# Patient Record
Sex: Male | Born: 1950 | Race: White | Hispanic: No | Marital: Married
Health system: Northeastern US, Academic
[De-identification: ages and names within clinical notes are randomized; demographics above are authoritative.]

## PROBLEM LIST (undated history)

## (undated) DIAGNOSIS — D649 Anemia, unspecified: Secondary | ICD-10-CM

## (undated) DIAGNOSIS — G4730 Sleep apnea, unspecified: Secondary | ICD-10-CM

## (undated) DIAGNOSIS — E119 Type 2 diabetes mellitus without complications: Secondary | ICD-10-CM

## (undated) DIAGNOSIS — K219 Gastro-esophageal reflux disease without esophagitis: Secondary | ICD-10-CM

## (undated) DIAGNOSIS — Z860100 Personal history of colon polyps, unspecified: Secondary | ICD-10-CM

## (undated) DIAGNOSIS — K209 Esophagitis, unspecified without bleeding: Secondary | ICD-10-CM

## (undated) DIAGNOSIS — I10 Essential (primary) hypertension: Secondary | ICD-10-CM

## (undated) DIAGNOSIS — Z9889 Other specified postprocedural states: Secondary | ICD-10-CM

## (undated) DIAGNOSIS — K7460 Unspecified cirrhosis of liver: Secondary | ICD-10-CM

## (undated) DIAGNOSIS — C4490 Unspecified malignant neoplasm of skin, unspecified: Secondary | ICD-10-CM

## (undated) HISTORY — PX: SKIN CANCER EXCISION: SHX779

## (undated) HISTORY — PX: COLONOSCOPY: SHX174

## (undated) HISTORY — PX: UVULOPALATOPHARYNGOPLASTY: SHX827

## (undated) HISTORY — PX: LIVER BIOPSY: SHX301

## (undated) HISTORY — PX: EYE SURGERY: SHX253

## (undated) HISTORY — PX: TONSILLECTOMY: SUR1361

---

## 2003-05-11 HISTORY — PX: UVULOPALATOPHARYNGOPLASTY: SHX827

## 2005-10-28 ENCOUNTER — Ambulatory Visit: Payer: Self-pay | Admitting: Internal Medicine

## 2005-11-05 ENCOUNTER — Ambulatory Visit: Payer: Self-pay | Admitting: Unknown Physician Specialty

## 2005-11-07 ENCOUNTER — Ambulatory Visit: Payer: Self-pay | Admitting: Internal Medicine

## 2005-12-08 ENCOUNTER — Ambulatory Visit: Payer: Self-pay | Admitting: Internal Medicine

## 2006-02-11 ENCOUNTER — Ambulatory Visit: Payer: Self-pay | Admitting: Internal Medicine

## 2006-03-10 ENCOUNTER — Ambulatory Visit: Payer: Self-pay | Admitting: Internal Medicine

## 2009-06-20 ENCOUNTER — Ambulatory Visit: Payer: Self-pay | Admitting: Internal Medicine

## 2009-07-08 ENCOUNTER — Ambulatory Visit: Payer: Self-pay | Admitting: Internal Medicine

## 2011-09-01 ENCOUNTER — Ambulatory Visit: Payer: Self-pay | Admitting: Podiatry

## 2011-10-19 ENCOUNTER — Ambulatory Visit: Payer: Self-pay | Admitting: Internal Medicine

## 2012-04-25 ENCOUNTER — Ambulatory Visit: Payer: Self-pay | Admitting: Internal Medicine

## 2015-07-10 ENCOUNTER — Other Ambulatory Visit: Payer: Self-pay | Admitting: Internal Medicine

## 2015-08-28 ENCOUNTER — Ambulatory Visit: Payer: Self-pay

## 2016-03-15 ENCOUNTER — Encounter: Payer: Self-pay | Admitting: *Deleted

## 2016-03-15 ENCOUNTER — Emergency Department (INDEPENDENT_AMBULATORY_CARE_PROVIDER_SITE_OTHER)
Admission: EM | Admit: 2016-03-15 | Discharge: 2016-03-15 | Disposition: A | Discharge status: Home / Self Care | Payer: 59 | Source: Home / Self Care | Attending: Family Medicine | Admitting: Family Medicine

## 2016-03-15 DIAGNOSIS — B9789 Other viral agents as the cause of diseases classified elsewhere: Secondary | ICD-10-CM

## 2016-03-15 DIAGNOSIS — J069 Acute upper respiratory infection, unspecified: Secondary | ICD-10-CM

## 2016-03-15 HISTORY — DX: Unspecified malignant neoplasm of skin, unspecified: C44.90

## 2016-03-15 HISTORY — DX: Wilson's disease: E83.01

## 2016-03-15 HISTORY — DX: Type 2 diabetes mellitus without complications: E11.9

## 2016-03-15 LAB — POCT INFLUENZA A/B
INFLUENZA B, POC: NEGATIVE
Influenza A, POC: NEGATIVE

## 2016-03-15 MED ORDER — AMOXICILLIN 875 MG PO TABS: 875.0000 mg | ORAL_TABLET | Freq: Two times a day (BID) | ORAL | 0 refills | Status: DC

## 2016-03-15 MED ORDER — BENZONATATE 200 MG PO CAPS: 200.0000 mg | ORAL_CAPSULE | Freq: Every day | ORAL | 0 refills | Status: DC

## 2016-03-15 NOTE — ED Provider Notes (Signed)
Jack Powell C[facility]    CSN: [id number] Arrival date & time: 03/15/16  0902     History   Chief Complaint Chief Complaint  Patient presents with  . Cough    HPI Jack Powell is a 65 y.o. male.   Complains of 3 day history flu-like illness including myalgias, headache, fever/chills, fatigue, and cough.  Also has mild nasal congestion and sore throat.  Cough is non-productive and somewhat worse at night.  No pleuritic pain or shortness of breath.  He has not had a flu shot this season.     The history is provided by the patient and the spouse.    Past Medical History:  Diagnosis Date  . Diabetes mellitus without complication ([place])   . Skin cancer   . Wilson disease     There are no active problems to display for this patient.   Past Surgical History:  Procedure Laterality Date  . LIVER BIOPSY    . SKIN CANCER EXCISION    . UVULOPALATOPHARYNGOPLASTY         Home Medications    Prior to Admission medications   Medication Sig Start Date End Date Taking? Authorizing Provider  ALPRAZolam Duanne Moron) 0.25 MG tablet Take 0.25 mg by mouth at bedtime as needed for anxiety.   Yes Historical Provider, MD  cholecalciferol (VITAMIN D) 1000 units tablet Take 1,000 Units by mouth daily.   Yes Historical Provider, MD  glimepiride (AMARYL) 2 MG tablet Take 2 mg by mouth daily with breakfast.   Yes Historical Provider, MD  hydrochlorothiazide (HYDRODIURIL) 25 MG tablet Take 25 mg by mouth daily.   Yes Historical Provider, MD  linagliptin (TRADJENTA) 5 MG TABS tablet Take 5 mg by mouth daily.   Yes Historical Provider, MD  MULTIPLE VITAMIN PO Take by mouth.   Yes Historical Provider, MD  omeprazole (PRILOSEC) 20 MG capsule Take 20 mg by mouth daily.   Yes Historical Provider, MD  Trientine HCl (SYPRINE) 250 MG CAPS Take by mouth.   Yes Historical Provider, MD  amoxicillin (AMOXIL) 875 MG tablet Take 1 tablet (875 mg total) by mouth 2 (two) times daily. (Rx void after  03/23/16) 03/15/16   Kandra Nicolas, MD  benzonatate (TESSALON) 200 MG capsule Take 1 capsule (200 mg total) by mouth at bedtime. Take as needed for cough 03/15/16   Kandra Nicolas, MD    Family History Family History  Problem Relation Age of Onset  . Diabetes Mother   . Heart disease Mother   . Cancer Father     colon    Social History Social History  Substance Use Topics  . Smoking status: Never Smoker  . Smokeless tobacco: Never Used  . Alcohol use Yes     Allergies   Penicillins   Review of Systems Review of Systems + sore throat + cough No pleuritic pain No wheezing + nasal congestion + post-nasal drainage No sinus pain/pressure No itchy/red eyes No earache No hemoptysis No SOB ? fever, + chills/sweats No nausea No vomiting No abdominal pain No diarrhea No urinary symptoms No skin rash + fatigue + myalgias + headache Used OTC meds without relief   Physical Exam Triage Vital Signs ED Triage Vitals  Enc Vitals Group     BP 03/15/16 0930 144/73     Pulse Rate 03/15/16 0930 77     Resp 03/15/16 0930 16     Temp 03/15/16 0930 98.3 F (36.8 C)     Temp Source 03/15/16  0930 Oral     SpO2 03/15/16 0930 96 %     Weight 03/15/16 0930 163 lb (73.9 kg)     Height 03/15/16 0930 6' (1.829 m)     Head Circumference --      Peak Flow --      Pain Score 03/15/16 0935 0     Pain Loc --      Pain Edu? --      Excl. in [place]? --    No data found.   Updated Vital Signs BP 144/73 (BP Location: Left Arm)   Pulse 77   Temp 98.3 F (36.8 C) (Oral)   Resp 16   Ht 6' (1.829 m)   Wt 163 lb (73.9 kg)   SpO2 96%   BMI 22.11 kg/m   Visual Acuity Right Eye Distance:   Left Eye Distance:   Bilateral Distance:    Right Eye Near:   Left Eye Near:    Bilateral Near:     Physical Exam Nursing notes and Vital Signs reviewed. Appearance:  Patient appears stated age, and in no acute distress Eyes:  Pupils are equal, round, and reactive to light and  accomodation.  Extraocular movement is intact.  Conjunctivae are not inflamed  Ears:  Canals normal.  Tympanic membranes normal.  Nose:  Mildly congested turbinates.  No sinus tenderness.  Pharynx:  Normal Neck:  Supple.  Tender enlarged posterior/lateral nodes are palpated bilaterally  Lungs:  Clear to auscultation.  Breath sounds are equal.  Moving air well. Heart:  Regular rate and rhythm without murmurs, rubs, or gallops.  Abdomen:  Nontender without masses or hepatosplenomegaly.  Bowel sounds are present.  No CVA or flank tenderness.  Extremities:  No edema.  Skin:  No rash present.    UC Treatments / Results  Labs (all labs ordered are listed, but only abnormal results are displayed) Labs Reviewed  POCT INFLUENZA A/B negative    EKG  EKG Interpretation None       Radiology No results found.  Procedures Procedures (including critical care time)  Medications Ordered in UC Medications - No data to display   Initial Impression / Assessment and Plan / UC Course  I have reviewed the triage vital signs and the nursing notes.  Pertinent labs & imaging results that were available during my care of the patient were reviewed by me and considered in my medical decision making (see chart for details).  Clinical Course   There is no evidence of bacterial infection today.  Treat symptomatically for now  Prescription written for Benzonatate (Tessalon) to take at bedtime for night-time cough.  Take plain guaifenesin (1200mg  extended release tabs such as Mucinex) twice daily, with plenty of water, for cough and congestion.  May add Pseudoephedrine (30mg , one or two every 4 to 6 hours) for sinus congestion.  Get adequate rest.   May use Afrin nasal spray (or generic oxymetazoline) twice daily for about 5 days and then discontinue.  Also recommend using saline nasal spray several times daily and saline nasal irrigation (AYR is a common brand).  Use Flonase nasal spray each morning after  using Afrin nasal spray and saline nasal irrigation. Try warm salt water gargles for sore throat.  Stop all antihistamines for now, and other non-prescription cough/cold preparations. May take Ibuprofen 200mg , 4 tabs every 8 hours with food for body aches, headache, etc. Begin Amoxicillin if not improving about one week or if persistent fever develops (Given a prescription to hold,  with an expiration date)  Follow-up with family doctor if not improving about10 days.      Final Clinical Impressions(s) / UC Diagnoses   Final diagnoses:  Viral URI with cough    New Prescriptions New Prescriptions   AMOXICILLIN (AMOXIL) 875 MG TABLET    Take 1 tablet (875 mg total) by mouth 2 (two) times daily. (Rx void after 03/23/16)   BENZONATATE (TESSALON) 200 MG CAPSULE    Take 1 capsule (200 mg total) by mouth at bedtime. Take as needed for cough     Kandra Nicolas, MD 03/15/16 [phone]

## 2016-03-15 NOTE — Discharge Instructions (Signed)
Take plain guaifenesin (1200mg  extended release tabs such as Mucinex) twice daily, with plenty of water, for cough and congestion.  May add Pseudoephedrine (30mg , one or two every 4 to 6 hours) for sinus congestion.  Get adequate rest.   May use Afrin nasal spray (or generic oxymetazoline) twice daily for about 5 days and then discontinue.  Also recommend using saline nasal spray several times daily and saline nasal irrigation (AYR is a common brand).  Use Flonase nasal spray each morning after using Afrin nasal spray and saline nasal irrigation. Try warm salt water gargles for sore throat.  Stop all antihistamines for now, and other non-prescription cough/cold preparations. May take Ibuprofen 200mg , 4 tabs every 8 hours with food for body aches, headache, etc. Begin Amoxicillin if not improving about one week or if persistent fever develops. Follow-up with family doctor if not improving about10 days.

## 2016-03-15 NOTE — ED Triage Notes (Signed)
Pt c/o productive cough, hoarseness, and feeling hot/cold x 1 day.

## 2016-04-30 ENCOUNTER — Encounter: Payer: Self-pay | Admitting: *Deleted

## 2016-05-05 ENCOUNTER — Ambulatory Visit: Payer: Commercial Managed Care - HMO | Admitting: Certified Registered Nurse Anesthetist

## 2016-05-05 ENCOUNTER — Encounter
Admission: RE | Disposition: A | Discharge status: Home / Self Care | Payer: Self-pay | Source: Ambulatory Visit | Attending: Unknown Physician Specialty

## 2016-05-05 ENCOUNTER — Ambulatory Visit
Admission: RE | Admit: 2016-05-05 | Discharge: 2016-05-05 | Disposition: A | Discharge status: Home / Self Care | Payer: Commercial Managed Care - HMO | Source: Ambulatory Visit | Attending: Unknown Physician Specialty | Admitting: Unknown Physician Specialty

## 2016-05-05 ENCOUNTER — Encounter: Payer: Self-pay | Admitting: *Deleted

## 2016-05-05 DIAGNOSIS — D500 Iron deficiency anemia secondary to blood loss (chronic): Secondary | ICD-10-CM | POA: Insufficient documentation

## 2016-05-05 DIAGNOSIS — K3189 Other diseases of stomach and duodenum: Secondary | ICD-10-CM | POA: Insufficient documentation

## 2016-05-05 DIAGNOSIS — I10 Essential (primary) hypertension: Secondary | ICD-10-CM | POA: Diagnosis not present

## 2016-05-05 DIAGNOSIS — K31819 Angiodysplasia of stomach and duodenum without bleeding: Secondary | ICD-10-CM | POA: Insufficient documentation

## 2016-05-05 DIAGNOSIS — Z7984 Long term (current) use of oral hypoglycemic drugs: Secondary | ICD-10-CM | POA: Insufficient documentation

## 2016-05-05 DIAGNOSIS — K219 Gastro-esophageal reflux disease without esophagitis: Secondary | ICD-10-CM | POA: Insufficient documentation

## 2016-05-05 DIAGNOSIS — Z8719 Personal history of other diseases of the digestive system: Secondary | ICD-10-CM | POA: Insufficient documentation

## 2016-05-05 DIAGNOSIS — Z79899 Other long term (current) drug therapy: Secondary | ICD-10-CM | POA: Insufficient documentation

## 2016-05-05 DIAGNOSIS — E119 Type 2 diabetes mellitus without complications: Secondary | ICD-10-CM | POA: Insufficient documentation

## 2016-05-05 DIAGNOSIS — I8500 Esophageal varices without bleeding: Secondary | ICD-10-CM | POA: Insufficient documentation

## 2016-05-05 HISTORY — PX: ESOPHAGOGASTRODUODENOSCOPY (EGD) WITH PROPOFOL: SHX5813

## 2016-05-05 HISTORY — DX: Gastro-esophageal reflux disease without esophagitis: K21.9

## 2016-05-05 HISTORY — DX: Personal history of colonic polyps: Z86.010

## 2016-05-05 HISTORY — DX: Essential (primary) hypertension: I10

## 2016-05-05 HISTORY — DX: Anemia, unspecified: D64.9

## 2016-05-05 HISTORY — DX: Esophagitis, unspecified: K20.9

## 2016-05-05 HISTORY — DX: Esophagitis, unspecified without bleeding: K20.90

## 2016-05-05 HISTORY — DX: Personal history of colon polyps, unspecified: Z86.0100

## 2016-05-05 LAB — GLUCOSE, CAPILLARY: GLUCOSE-CAPILLARY: 150 mg/dL — AB (ref 65–99)

## 2016-05-05 SURGERY — ESOPHAGOGASTRODUODENOSCOPY (EGD) WITH PROPOFOL
Anesthesia: General

## 2016-05-05 MED ORDER — PROPOFOL 10 MG/ML IV BOLUS
INTRAVENOUS | Status: AC
  Filled 2016-05-05: qty 20

## 2016-05-05 MED ORDER — PROPOFOL 500 MG/50ML IV EMUL
INTRAVENOUS | Status: AC
  Filled 2016-05-05: qty 50

## 2016-05-05 MED ORDER — LIDOCAINE HCL (CARDIAC) 20 MG/ML IV SOLN
INTRAVENOUS | Status: DC | PRN
  Administered 2016-05-05: 50 mg via INTRAVENOUS

## 2016-05-05 MED ORDER — LIDOCAINE 2% (20 MG/ML) 5 ML SYRINGE
INTRAMUSCULAR | Status: AC
  Filled 2016-05-05: qty 5

## 2016-05-05 MED ORDER — SODIUM CHLORIDE 0.9 % IV SOLN: INTRAVENOUS | Status: DC

## 2016-05-05 MED ORDER — PROPOFOL 500 MG/50ML IV EMUL
INTRAVENOUS | Status: DC | PRN
  Administered 2016-05-05: 140 ug/kg/min via INTRAVENOUS

## 2016-05-05 MED ORDER — PROPOFOL 10 MG/ML IV BOLUS
INTRAVENOUS | Status: DC | PRN
  Administered 2016-05-05: 10 mg via INTRAVENOUS
  Administered 2016-05-05: 30 mg via INTRAVENOUS
  Administered 2016-05-05: 10 mg via INTRAVENOUS

## 2016-05-05 MED ORDER — MIDAZOLAM HCL 2 MG/2ML IJ SOLN
INTRAMUSCULAR | Status: AC
  Filled 2016-05-05: qty 2

## 2016-05-05 MED ORDER — GLYCOPYRROLATE 0.2 MG/ML IJ SOLN
INTRAMUSCULAR | Status: AC
  Filled 2016-05-05: qty 1

## 2016-05-05 MED ORDER — GLYCOPYRROLATE 0.2 MG/ML IJ SOLN
INTRAMUSCULAR | Status: DC | PRN
  Administered 2016-05-05: 0.2 mg via INTRAVENOUS

## 2016-05-05 MED ORDER — MIDAZOLAM HCL 2 MG/2ML IJ SOLN
INTRAMUSCULAR | Status: DC | PRN
  Administered 2016-05-05 (×2): 1 mg via INTRAVENOUS

## 2016-05-05 MED ORDER — SODIUM CHLORIDE 0.9 % IV SOLN
INTRAVENOUS | Status: DC
  Administered 2016-05-05: 1000 mL via INTRAVENOUS

## 2016-05-05 NOTE — Anesthesia Postprocedure Evaluation (Signed)
Anesthesia Post Note  Patient: Jack Powell  Procedure(s) Performed: Procedure(s) (LRB): ESOPHAGOGASTRODUODENOSCOPY (EGD) WITH PROPOFOL (N/A)  Patient location during evaluation: PACU Anesthesia Type: General Level of consciousness: awake Pain management: pain level controlled Vital Signs Assessment: post-procedure vital signs reviewed and stable Respiratory status: nonlabored ventilation Cardiovascular status: stable Anesthetic complications: no     Last Vitals:  Vitals:   05/05/16 1050 05/05/16 1100  BP: 127/72 135/76  Pulse: 90 88  Resp: 14 16  Temp:      Last Pain:  Vitals:   05/05/16 0937  TempSrc: Tympanic                 VAN STAVEREN,[name]

## 2016-05-05 NOTE — Anesthesia Procedure Notes (Signed)
Date/Time: 05/05/2016 10:25 AM Performed by: Johnna Acosta Pre-anesthesia Checklist: Patient identified, Emergency Drugs available, Suction available, Patient being monitored and Timeout performed Patient Re-evaluated:Patient Re-evaluated prior to inductionOxygen Delivery Method: Nasal cannula

## 2016-05-05 NOTE — Op Note (Signed)
[facility] Patient Name: Jack Powell Procedure Date: 05/05/2016 10:22 AM MRN: [id number] Account #: [id number] Date of Birth: 11-27-1950 Admit Type: Outpatient Age: 65 Room: [facility] ENDO ROOM 4 Gender: Male Note Status: Finalized Procedure:            Upper GI endoscopy Indications:          Iron deficiency anemia secondary to chronic blood loss,                        Treatment of very small Dieulafoy lesions seen on                        previous EGD one week ago. Providers:            Manya Silvas, MD Referring MD:         Rusty Aus, MD (Referring MD) Complications:        No immediate complications. Procedure:            Pre-Anesthesia Assessment:                       - After reviewing the risks and benefits, the patient                        was deemed in satisfactory condition to undergo the                        procedure.                       After obtaining informed consent, the endoscope was                        passed under direct vision. Throughout the procedure,                        the patient's blood pressure, pulse, and oxygen                        saturations were monitored continuously. The Endoscope                        was introduced through the mouth, and advanced to the                        prepyloric region, stomach. The upper GI endoscopy was                        accomplished without difficulty. The patient tolerated                        the procedure well. Findings:      Grade II varices were found in the entire esophagus. Tended to be larger       in mid esophagus.      Diffuse mildly/moderately congested mucosa was found in the gastric body       and in the gastric antrum. No spots of bleeding and no blood in the       stomach this time. Numerous small erythematous spots scattered in antrum       but no bleeding nor scab from  previous treatment. Impression:           - Grade II  esophageal varices.                       - Congestive gastropathy.                       - No specimens collected. Recommendation:       - The findings and recommendations were discussed with                        the patient's family. Manya Silvas, MD 05/05/2016 10:41:45 AM This report has been signed electronically. Number of Addenda: 0 Note Initiated On: 05/05/2016 10:22 AM      [facility]

## 2016-05-05 NOTE — H&P (Signed)
Primary Care Physician:  Rusty Aus, MD Primary Gastroenterologist:  Dr. Vira Agar  Pre-Procedure History & Physical: HPI:  Jack Powell is a 65 y.o. male is here for an endoscopy.   Past Medical History:  Diagnosis Date  . Anemia   . Diabetes mellitus without complication ([place])   . Esophagitis   . GERD (gastroesophageal reflux disease)   . History of colon polyps   . Hypertension   . Skin cancer   . Wilson disease     Past Surgical History:  Procedure Laterality Date  . COLONOSCOPY    . LIVER BIOPSY    . SKIN CANCER EXCISION    . TONSILLECTOMY    . UVULOPALATOPHARYNGOPLASTY      Prior to Admission medications   Medication Sig Start Date End Date Taking? Authorizing Provider  ALPRAZolam Duanne Moron) 0.25 MG tablet Take 0.25 mg by mouth at bedtime as needed for anxiety.    Historical Provider, MD  cholecalciferol (VITAMIN D) 1000 units tablet Take 1,000 Units by mouth daily.    Historical Provider, MD  glimepiride (AMARYL) 2 MG tablet Take 2 mg by mouth daily with breakfast.    Historical Provider, MD  hydrochlorothiazide (HYDRODIURIL) 25 MG tablet Take 25 mg by mouth daily.    Historical Provider, MD  linagliptin (TRADJENTA) 5 MG TABS tablet Take 5 mg by mouth daily.    Historical Provider, MD  MULTIPLE VITAMIN PO Take by mouth.    Historical Provider, MD  omeprazole (PRILOSEC) 20 MG capsule Take 20 mg by mouth daily.    Historical Provider, MD  Trientine HCl (SYPRINE) 250 MG CAPS Take by mouth.    Historical Provider, MD    Allergies as of 04/27/2016 - Review Complete 03/15/2016  Allergen Reaction Noted  . Penicillins  03/15/2016    Family History  Problem Relation Age of Onset  . Diabetes Mother   . Heart disease Mother   . Cancer Father     colon    Social History   Social History  . Marital status: Married    Spouse name: N/A  . Number of children: N/A  . Years of education: N/A   Occupational History  . Not on file.   Social History Main  Topics  . Smoking status: Never Smoker  . Smokeless tobacco: Never Used  . Alcohol use No  . Drug use: No  . Sexual activity: Not on file   Other Topics Concern  . Not on file   Social History Narrative  . No narrative on file    Review of Systems: See HPI, otherwise negative ROS  Physical Exam: BP (!) 142/70   Pulse 78   Temp 97.3 F (36.3 C) (Tympanic)   Resp 18   Ht 6' (1.829 m)   Wt 74.8 kg (165 lb)   SpO2 99%   BMI 22.38 kg/m  General:   Alert,  pleasant and cooperative in NAD Head:  Normocephalic and atraumatic. Neck:  Supple; no masses or thyromegaly. Lungs:  Clear throughout to auscultation.    Heart:  Regular rate and rhythm. Abdomen:  Soft, nontender and nondistended. Normal bowel sounds, without guarding, and without rebound.   Neurologic:  Alert and  oriented x4;  grossly normal neurologically.  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  Risks, benefits, limitations, and alternatives regarding  endoscopy have been reviewed with the patient.  Questions have been answered.  All parties agreeable.  Gaylyn Cheers, MD  05/05/2016, 10:20 AM

## 2016-05-05 NOTE — Transfer of Care (Signed)
Immediate Anesthesia Transfer of Care Note  Patient: Jack Powell  Procedure(s) Performed: Procedure(s): ESOPHAGOGASTRODUODENOSCOPY (EGD) WITH PROPOFOL (N/A)  Patient Location: PACU  Anesthesia Type:General  Level of Consciousness: sedated  Airway & Oxygen Therapy: Patient Spontanous Breathing and Patient connected to nasal cannula oxygen  Post-op Assessment: Report given to RN and Post -op Vital signs reviewed and stable  Post vital signs: Reviewed and stable  Last Vitals:  Vitals:   05/05/16 0937 05/05/16 1040  BP: (!) 142/70 124/65  Pulse: 78 91  Resp: 18 13  Temp: 36.3 C (!) 35.7 C    Last Pain:  Vitals:   05/05/16 0937  TempSrc: Tympanic         Complications: No apparent anesthesia complications

## 2016-05-05 NOTE — Anesthesia Preprocedure Evaluation (Signed)
Anesthesia Evaluation  Patient identified by MRN, date of birth, ID band Patient awake    Reviewed: Allergy & Precautions, NPO status , Patient's Chart, lab work & pertinent test results  Airway Mallampati: III       Dental  (+) Teeth Intact   Pulmonary neg pulmonary ROS,    breath sounds clear to auscultation       Cardiovascular Exercise Tolerance: Good hypertension, Pt. on medications  Rhythm:Regular     Neuro/Psych negative psych ROS   GI/Hepatic Neg liver ROS, GERD  Medicated,  Endo/Other  diabetes, Type 2  Renal/GU negative Renal ROS     Musculoskeletal   Abdominal Normal abdominal exam  (+)   Peds negative pediatric ROS (+)  Hematology  (+) anemia ,   Anesthesia Other Findings   Reproductive/Obstetrics                             Anesthesia Physical Anesthesia Plan  ASA: II  Anesthesia Plan: General   Post-op Pain Management:    Induction: Intravenous  Airway Management Planned: Natural Airway and Nasal Cannula  Additional Equipment:   Intra-op Plan:   Post-operative Plan:   Informed Consent: I have reviewed the patients History and Physical, chart, labs and discussed the procedure including the risks, benefits and alternatives for the proposed anesthesia with the patient or authorized representative who has indicated his/her understanding and acceptance.     Plan Discussed with: CRNA  Anesthesia Plan Comments:         Anesthesia Quick Evaluation

## 2016-05-06 ENCOUNTER — Encounter: Payer: Self-pay | Admitting: Unknown Physician Specialty

## 2017-10-24 ENCOUNTER — Other Ambulatory Visit: Payer: Self-pay | Admitting: Physician Assistant

## 2017-10-24 ENCOUNTER — Ambulatory Visit
Admission: RE | Admit: 2017-10-24 | Discharge: 2017-10-24 | Disposition: A | Discharge status: Home / Self Care | Payer: Commercial Managed Care - HMO | Source: Ambulatory Visit | Attending: Physician Assistant | Admitting: Physician Assistant

## 2017-10-24 DIAGNOSIS — R4182 Altered mental status, unspecified: Secondary | ICD-10-CM | POA: Insufficient documentation

## 2018-03-14 ENCOUNTER — Other Ambulatory Visit: Payer: Self-pay | Admitting: Student

## 2018-03-14 DIAGNOSIS — R1310 Dysphagia, unspecified: Secondary | ICD-10-CM

## 2018-03-14 DIAGNOSIS — R1312 Dysphagia, oropharyngeal phase: Secondary | ICD-10-CM

## 2018-03-23 ENCOUNTER — Ambulatory Visit: Payer: 59

## 2018-03-28 ENCOUNTER — Ambulatory Visit
Admission: RE | Admit: 2018-03-28 | Discharge: 2018-03-28 | Disposition: A | Discharge status: Home / Self Care | Payer: 59 | Source: Ambulatory Visit | Attending: Student | Admitting: Student

## 2018-03-28 DIAGNOSIS — R1314 Dysphagia, pharyngoesophageal phase: Secondary | ICD-10-CM

## 2018-03-28 DIAGNOSIS — R1310 Dysphagia, unspecified: Secondary | ICD-10-CM | POA: Insufficient documentation

## 2018-03-28 DIAGNOSIS — R1312 Dysphagia, oropharyngeal phase: Secondary | ICD-10-CM | POA: Diagnosis not present

## 2018-03-28 NOTE — Therapy (Addendum)
[place], [place], [address] Phone: [phone]   Fax:     Modified Barium Swallow  Patient Details  Name: Jack Powell MRN: [id number] Date of Birth: Sep 07, 1950 No data recorded  Encounter Date: 03/28/2018  End of Session - 03/28/18 1530    Visit Number  1    Number of Visits  1    Date for SLP Re-Evaluation  03/28/18    SLP Start Time  1245    SLP Stop Time   1400    SLP Time Calculation (min)  75 min    Activity Tolerance  Patient tolerated treatment well       Past Medical History:  Diagnosis Date  . Anemia   . Diabetes mellitus without complication ([place])   . Esophagitis   . GERD (gastroesophageal reflux disease)   . History of colon polyps   . Hypertension   . Skin cancer   . Wilson disease     Past Surgical History:  Procedure Laterality Date  . COLONOSCOPY    . ESOPHAGOGASTRODUODENOSCOPY (EGD) WITH PROPOFOL N/A 05/05/2016   Procedure: ESOPHAGOGASTRODUODENOSCOPY (EGD) WITH PROPOFOL;  Surgeon: Manya Silvas, MD;  Location: [facility] ENDOSCOPY;  Service: Endoscopy;  Laterality: N/A;  . LIVER BIOPSY    . SKIN CANCER EXCISION    . TONSILLECTOMY    . UVULOPALATOPHARYNGOPLASTY      There were no vitals filed for this visit.       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 He is also followed by Dr. 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 Locally, Dr. Sabra Heck manages hepatic encephalopathy. Pt reported recent difficulty managing his Ammonia level using his Lactulose resulting in some lethargy and decreased bowel movements. Pt denied  any Neurological or Pulmonary issues in the past year. Native dentition. No oral motor weakness per OM exam.  Chief complaint: dysphagia primarily w/ Pills. Pt reports difficulty swallowing pills, like his magnesium and Invokana tablets, for the last several months. The tablets feel stuck between the cricoid cartilage and sternal notch. He eats a small item like a piece of cheese to help the tablet pass. No coughing or choking. He recalls having his esophagus dilated in the early 2000s after a piece of meat became impacted in his esophagus. He denies solid food dysphagia (except w/ pieces of raw Apple and Bread at [place]); denied regurgitation of foods at this time. Also no liquid dysphagia. Heartburn and acid reflux is managed on omeprazole daily. He accidentally left omeprazole out of his morning pills for about 1 week, and experienced significant burning through his esophagus thereafter. He resumed omeprazole as soon as the error was realized, and is now controlled once again. He indicated he was just switched from Prilosec to Protonix 40mg  for a PPI earlier this month. Pt recalled he did have Esophageal diliation "years ago" which relieved s/s of what he is describing currently "but they were worse because it was happening with foods also". Noted his longstanding h/o GERD w/ episodes of Esophagitis and briefly discussed the impact of such on Esophageal motility over time. Also noted pt's raspy vocal quality - Reflux material contacting the vocal cords AND any excessive tension can have  an impact on overall vocal quality as well as tension during the swallow.    Objective:  Radiological Procedure: A videoflouroscopic evaluation of oral-preparatory, reflex initiation, and pharyngeal phases of the swallow was performed; as well as a screening of the upper esophageal phase.  I. POSTURE: upright II. VIEW: lateral III. COMPENSATORY STRATEGIES: chin tuck w/ swallowing or during f/u swallowing to aid  clearing pharyngeal residue; multiple swallowing to aid clearing the Pharynx and Cervical Esophagus viewable IV. BOLUSES ADMINISTERED:  Thin Liquid: 7-8 multiple sips via Cup  Nectar-thick Liquid: 3-4 multiple sips via Cup  Honey-thick Liquid: NT  Puree: 2 trials  Mechanical Soft: 1 trial  Barium Tablet in Puree: 1 tablet  V. RESULTS OF EVALUATION: A. ORAL PREPARATORY PHASE: (The lips, tongue, and velum are observed for strength and coordination)       **Overall Severity Rating: WFL. Pt exhibited adequate oral phase bolus management for mastication and timely A-P transfer; oral clearing achieved post swallow.   B. SWALLOW INITIATION/REFLEX: (The reflex is normal if "triggered" by the time the bolus reached the base of the tongue)  **Overall Severity Rating: [facility] Timing of the pharyngeal swallow was appropriate w/ boluses triggering the swallow at the level of the BOT-Valleculae. Adequate timing of airway closure noted w/ no aspiration occurring during this study.   C. PHARYNGEAL PHASE: (Pharyngeal function is normal if the bolus shows rapid, smooth, and continuous transit through the pharynx and there is no pharyngeal residue after the swallow)  **Overall Severity Rating: MILD+. Pt exhibited Min-Mod pharyngeal residue post swallow - bolus residue in both the Valleculae and Pyriform Sinuses w/ all consistencies but moreso w/ increased texture trials (foods). Laryngeal excursion and BOT contact against the pharyngeal wall during the swallow appeared WFL, though min decreased amplitude and anterior movement of the arytenoids. Suspect a potentially restricted UES opening (timing and patency) for bolus passage from the Pharynx into the Esophagus. When pt utilized the strategy of a [employer] during swallowing, more efficient and effective bolus motility was noted through the Pharynx into the Esophagus w/ Slight-Min amount of bolus residue remaining post swallowing w/ all trial consistencies. Of note, this  dysmotility was noted during presentation of the Barium Tablet even when given in a Tsp of Puree and using a Chin Tuck position - the Barium Tablet appeared to have difficulty maneuvering through the UES and traversing the Cervical Esophagus, just below the UES, despite the use of Puree and multiple swallows.  D. LARYNGEAL PENETRATION: (Material entering into the laryngeal inlet/vestibule but not aspirated): NONE  E. ASPIRATION: NONE F. ESOPHAGEAL PHASE: (Screening of the upper esophagus): Pt appeared to demonstrate a restricted UES opening (timing and patency) for bolus passage from the Pharynx into the Esophagus w/ all consistencies. Also noted bolus retention and slow clearing of bolus material including Slight-Min amount of liquid in the Cervical Esophagus viewable. Upon some multiple swallows during trials of Puree, bolus retention was noted in the Esophagus just below the level of the shoulders.   ASSESSMENT: Pt appeared to present w/ adequate oropharyngeal phase swallowing function w/ no specific oropharyngeal phase dysphagia noted. However, suspect there is impact on the pharyngeal phase from potential tension and decreased amplitude of opening of the UES during the swallow thus resulting in dysmotility of bolus material traversing through the UES and into Cervical Esophagus, and residue remaining in the pharynx. NO aspiration or laryngeal penetration occurred during this study. During the Esophageal phase, noted bolus retention and slow clearing of all bolus  material through the Cervical Esophagus including even Slight-Min amount of liquid (stasis) in the Cervical Esophagus viewable. Upon some multiple swallows during trials of Puree, bolus retention was noted in the Cervical-Thoracic Esophagus just below the level of the shoulders.  Pt exhibited Min-Mod pharyngeal residue post swallow - bolus residue in both the Valleculae and Pyriform Sinuses w/ all consistencies but moreso w/ increased texture  trials (foods). Laryngeal excursion and BOT contact against the pharyngeal wall during the swallow appeared WFL, though min decreased amplitude and anterior movement of the arytenoids. Suspect potential tension and restricted UES opening (timing and patency) for effective bolus passage from the Pharynx into the Esophagus. When pt utilized the strategy of a [employer] during swallowing, somewhat more efficient and effective bolus motility was noted through the Pharynx into the Esophagus w/ Slight-Min amount of bolus residue remaining post swallowing w/ all trial consistencies. Pt appeared to exert increased effort in all of his swallowing. Of important note, this dysmotility was noted during presentation of the Barium Tablet even when given in a Tsp of Puree and using a Chin Tuck position - the Barium Tablet appeared to have difficulty maneuvering through the UES and traversing the Cervical Esophagus, just below the UES, despite the use of Puree, chin tuck position, and multiple swallows. It remained "caught" b/t the pyriform sinuses and the UES. This corresponds w/ pt's c/o difficulty swallowing Pills at home and the feeling of Pills becoming "stuck". In addition, this similar muscle tension in this area may be impacting his vocal quality - further ENT assessment for muscle tension dysphonia, and vocal cord presentation, is indicated. Timing of the pharyngeal swallow was appropriate w/ boluses triggering the swallow at the level of the BOT-Valleculae. Adequate timing of airway closure noted w/ no aspiration occurring during this study. During the oral phase, pt exhibited adequate oral phase bolus management for mastication and timely A-P transfer; oral clearing achieved post swallow.   PLAN/RECOMMENDATIONS:  A. Diet: a more Mech Soft diet (cooked, softened, moist foods) w/ Thin liquids; ALL foods cut small and small bites utilized. Suggestions of foods such as Soups and puree foods in diet. Pills cut Small,  Crushed, or in Liquid form (per Pharmacy recommendations) and/or taken in a Puree for easier passage/clearing of the UES and Esophagus.   B. Swallowing Precautions: general aspiration precautions; REFLUX precautions.  C. Recommended consultation to continue f/u w/ GI - suggest a direct view to better explore the UES and the Esophageal walls; Manometry, or a Motility assessment, to best assess stripping wave action and pressures in the Esophagus. Also recommend f/u w/ ENT to assess the vocal quality, presentation, and potential for Muscle Tension Dysphonia d/t raspy voice and long standing h/o GERD w/ significant irritation is not taking his PPI. Recommend a Dietitian consult for nutritional support - potentially drink consistency supplements if indicated.  D. Therapy recommendations: None.   E. Results and recommendations were discussed w/ patient; video viewed, questions answered; recommendations discussed. Pt agreed verbally.                Dysphagia, pharyngoesophageal phase Pill dysphagia - Plan: DG Swallowing Func-Speech Pathology, DG Swallowing Func-Speech Pathology       Problem List There are no active problems to display for this patient.     Orinda Kenner, [place], CCC-SLP Watson,Katherine 03/28/2018, 3:32 PM  [facility] [place], [place], [address] Phone: [phone]   Fax:     Name: Jack  AQIB Powell MRN: [id number] Date of Birth: 11/07/1950

## 2018-04-13 ENCOUNTER — Other Ambulatory Visit: Payer: Self-pay | Admitting: Student

## 2018-04-13 DIAGNOSIS — R1312 Dysphagia, oropharyngeal phase: Secondary | ICD-10-CM

## 2018-04-13 DIAGNOSIS — R1310 Dysphagia, unspecified: Secondary | ICD-10-CM

## 2018-04-28 ENCOUNTER — Ambulatory Visit
Admission: RE | Admit: 2018-04-28 | Discharge: 2018-04-28 | Disposition: A | Discharge status: Home / Self Care | Payer: 59 | Source: Ambulatory Visit | Attending: Student | Admitting: Student

## 2018-04-28 DIAGNOSIS — R1310 Dysphagia, unspecified: Secondary | ICD-10-CM | POA: Insufficient documentation

## 2018-04-28 DIAGNOSIS — R1312 Dysphagia, oropharyngeal phase: Secondary | ICD-10-CM | POA: Insufficient documentation

## 2018-06-14 ENCOUNTER — Ambulatory Visit: Admission: RE | Admit: 2018-06-14 | Payer: 59 | Source: Home / Self Care | Admitting: Unknown Physician Specialty

## 2018-07-13 ENCOUNTER — Encounter: Admission: RE | Payer: Self-pay | Source: Home / Self Care

## 2018-07-13 SURGERY — ESOPHAGOGASTRODUODENOSCOPY (EGD) WITH PROPOFOL
Anesthesia: General

## 2018-10-03 ENCOUNTER — Other Ambulatory Visit
Admission: RE | Admit: 2018-10-03 | Discharge: 2018-10-03 | Disposition: A | Discharge status: Home / Self Care | Payer: BC Managed Care – PPO | Source: Ambulatory Visit | Attending: Student | Admitting: Student

## 2018-10-03 DIAGNOSIS — K7469 Other cirrhosis of liver: Secondary | ICD-10-CM | POA: Insufficient documentation

## 2018-10-03 LAB — AMMONIA: Ammonia: 95 umol/L — ABNORMAL HIGH (ref 9–35)

## 2018-10-09 ENCOUNTER — Other Ambulatory Visit: Payer: Self-pay

## 2018-10-10 ENCOUNTER — Inpatient Hospital Stay: Payer: BC Managed Care – PPO | Attending: Oncology | Admitting: Oncology

## 2018-10-10 ENCOUNTER — Encounter: Payer: Self-pay | Admitting: Oncology

## 2018-10-10 DIAGNOSIS — E119 Type 2 diabetes mellitus without complications: Secondary | ICD-10-CM | POA: Insufficient documentation

## 2018-10-10 DIAGNOSIS — D61818 Other pancytopenia: Secondary | ICD-10-CM | POA: Insufficient documentation

## 2018-10-10 DIAGNOSIS — K7460 Unspecified cirrhosis of liver: Secondary | ICD-10-CM | POA: Insufficient documentation

## 2018-10-10 DIAGNOSIS — Z85828 Personal history of other malignant neoplasm of skin: Secondary | ICD-10-CM | POA: Insufficient documentation

## 2018-10-10 DIAGNOSIS — D539 Nutritional anemia, unspecified: Secondary | ICD-10-CM

## 2018-10-10 DIAGNOSIS — Z79899 Other long term (current) drug therapy: Secondary | ICD-10-CM

## 2018-10-10 DIAGNOSIS — Z7984 Long term (current) use of oral hypoglycemic drugs: Secondary | ICD-10-CM | POA: Insufficient documentation

## 2018-10-10 DIAGNOSIS — I10 Essential (primary) hypertension: Secondary | ICD-10-CM | POA: Insufficient documentation

## 2018-10-10 NOTE — Progress Notes (Signed)
Pt new for anemia and thrombocytopenia. He ahs wilson disease for a long time. Had liver bx at age 68. He is also being monitored for ascites and gets ammonia checked regularly

## 2018-10-11 ENCOUNTER — Encounter: Payer: Self-pay | Admitting: Oncology

## 2018-10-11 NOTE — Progress Notes (Signed)
I connected with Jack Powell on 10/11/18 at  1:00 PM EDT by video enabled telemedicine visit and verified that I am speaking with the correct person using two identifiers.   I discussed the limitations, risks, security and privacy concerns of performing an evaluation and management service by telemedicine and the availability of in-person appointments. I also discussed with the patient that there may be a patient responsible charge related to this service. The patient expressed understanding and agreed to proceed.  Other persons participating in the visit and their role in the encounter:  Patients wife  Patient's location:  home Provider's location:  Work  Reason for referral: Pancytopenia. Referring provider Roderic Ovens, NP   History of presenting illness- Patient is a 68 year old male with a past medical history significant for Wilson's disease since the age of 56 and resultant cirrhosis.  He also has portal hypertension with esophageal varices.  This was being managed at [facility] and presently being co-managed by cardiology clinic GI.  Patient has been referred to [place] for pancytopenia.  Most recent CBC from 10/03/2018 showed white count of 3.1, H&H of 12.8/36.9 with an MCV of 100.3 and a platelet count of 63.  CMP was within normal limits.  Of note patient has had chronic pancytopenia at least dating back to 2016 and his white count waxes and wanes between 2.7-3.5.  His platelet count has been mostly between 70s to 80s.  I do not have the results of his recent ultrasound abdomen but the one that was done in our system back in 2013 had shown splenomegaly with a spleen size of 15 cm.  Patient is presently doing well from his liver standpoint.  He denies any recurrent infections or hospitalizations.  His appetite and weight is stable.   Review of Systems  Constitutional: Negative for chills, fever, malaise/fatigue and weight loss.  HENT: Negative for congestion, ear discharge and nosebleeds.    Eyes: Negative for blurred vision.  Respiratory: Negative for cough, hemoptysis, sputum production, shortness of breath and wheezing.   Cardiovascular: Negative for chest pain, palpitations, orthopnea and claudication.  Gastrointestinal: Negative for abdominal pain, blood in stool, constipation, diarrhea, heartburn, melena, nausea and vomiting.  Genitourinary: Negative for dysuria, flank pain, frequency, hematuria and urgency.  Musculoskeletal: Negative for back pain, joint pain and myalgias.  Skin: Negative for rash.  Neurological: Negative for dizziness, tingling, focal weakness, seizures, weakness and headaches.  Endo/Heme/Allergies: Does not bruise/bleed easily.  Psychiatric/Behavioral: Negative for depression and suicidal ideas. The patient does not have insomnia.     Allergies  Allergen Reactions  . Ace Inhibitors Cough  . Penicillins   . Serotonin Other (See Comments)    hallucinations    Past Medical History:  Diagnosis Date  . Anemia   . Diabetes mellitus without complication ([place])   . Esophagitis   . GERD (gastroesophageal reflux disease)   . History of colon polyps   . Hypertension   . Skin cancer   . Wilson disease     Past Surgical History:  Procedure Laterality Date  . COLONOSCOPY    . ESOPHAGOGASTRODUODENOSCOPY (EGD) WITH PROPOFOL N/A 05/05/2016   Procedure: ESOPHAGOGASTRODUODENOSCOPY (EGD) WITH PROPOFOL;  Surgeon: Manya Silvas, MD;  Location: [facility] ENDOSCOPY;  Service: Endoscopy;  Laterality: N/A;  . LIVER BIOPSY    . SKIN CANCER EXCISION    . TONSILLECTOMY    . UVULOPALATOPHARYNGOPLASTY      Social History   Socioeconomic History  . Marital status: Married    Spouse name:  Not on file  . Number of children: Not on file  . Years of education: Not on file  . Highest education level: Not on file  Occupational History  . Not on file  Social Needs  . Financial resource strain: Not on file  . Food insecurity:    Worry: Not on file    Inability:  Not on file  . Transportation needs:    Medical: Not on file    Non-medical: Not on file  Tobacco Use  . Smoking status: Never Smoker  . Smokeless tobacco: Never Used  Substance and Sexual Activity  . Alcohol use: No  . Drug use: No  . Sexual activity: Not Currently  Lifestyle  . Physical activity:    Days per week: Not on file    Minutes per session: Not on file  . Stress: Not on file  Relationships  . Social connections:    Talks on phone: Not on file    Gets together: Not on file    Attends religious service: Not on file    Active member of club or organization: Not on file    Attends meetings of clubs or organizations: Not on file    Relationship status: Not on file  . Intimate partner violence:    Fear of current or ex partner: Not on file    Emotionally abused: Not on file    Physically abused: Not on file    Forced sexual activity: Not on file  Other Topics Concern  . Not on file  Social History Narrative  . Not on file    Family History  Problem Relation Age of Onset  . Diabetes Mother   . Heart disease Mother   . Cancer Father        colon     Current Outpatient Medications:  .  ALPRAZolam (XANAX) 0.25 MG tablet, Take 0.25 mg by mouth at bedtime as needed for anxiety., Disp: , Rfl:  .  canagliflozin (INVOKANA) 300 MG TABS tablet, Take 300 mg by mouth daily before breakfast., Disp: , Rfl:  .  cholecalciferol (VITAMIN D) 1000 units tablet, Take 1,000 Units by mouth daily., Disp: , Rfl:  .  glimepiride (AMARYL) 2 MG tablet, Take 2 mg by mouth 2 (two) times a day. , Disp: , Rfl:  .  linagliptin (TRADJENTA) 5 MG TABS tablet, Take 5 mg by mouth daily., Disp: , Rfl:  .  MULTIPLE VITAMIN PO, Take 1 tablet by mouth daily. , Disp: , Rfl:  .  nadolol (CORGARD) 40 MG tablet, Take 40 mg by mouth daily., Disp: , Rfl:  .  pantoprazole (PROTONIX) 40 MG tablet, Take 40 mg by mouth daily., Disp: , Rfl:  .  rifaximin (XIFAXAN) 550 MG TABS tablet, Take 550 mg by mouth 2  (two) times daily., Disp: , Rfl:  .  triamterene-hydrochlorothiazide (MAXZIDE-25) 37.5-25 MG tablet, Take 1 tablet by mouth daily., Disp: , Rfl:  .  Trientine HCl (SYPRINE) 250 MG CAPS, Take 4 capsules by mouth daily. , Disp: , Rfl:   No results found.  No images are attached to the encounter.   No flowsheet data found. No flowsheet data found.   Observation/objective: Appears in no acute distress of a video visit today.  Breathing is nonlabored  Assessment and plan: Patient is a 68 year old male with history of Wilson's disease and resultant cirrhosis of the liver.  He has been referred to [place] for pancytopenia  Patient has had chronic pancytopenia at least dating  back to 2016 if not longer.  His white count fluctuates between 2.7-3.5 and differential mainly shows lymphopenia with occasional mild neutropenia.  His platelet counts have been mainly between 70s to 80s and the most recent platelet count was 63.  I suspect his pancytopenia is secondary to cirrhosis of his liver and given his longstanding trend over the last 4 to 5 years I am not inclined to do a bone marrow biopsy at this time.  Patient noted to have a mild anemia and his hemoglobin was 12.8.  I will check a TSH, B12, folate, ferritin and iron studies at this time.  We will also check a smear review.    Follow-up instructions:Video visit with Dr. Janese Banks in 2 weeks time  I discussed the assessment and treatment plan with the patient. The patient was provided an opportunity to ask questions and all were answered. The patient agreed with the plan and demonstrated an understanding of the instructions.   The patient was advised to call back or seek an in-person evaluation if the symptoms worsen or if the condition fails to improve as anticipated.   Visit Diagnosis: 1. Other pancytopenia ([place])   2. Macrocytic anemia   3. Cirrhosis of liver without ascites, unspecified hepatic cirrhosis type ([place])   4. Wilson's disease     Dr.  Randa Evens, MD, MPH [facility] [phone] 10/11/2018 8:16 AM

## 2018-10-13 ENCOUNTER — Other Ambulatory Visit: Payer: Self-pay

## 2018-10-13 ENCOUNTER — Inpatient Hospital Stay: Payer: BC Managed Care – PPO

## 2018-10-13 DIAGNOSIS — D61818 Other pancytopenia: Secondary | ICD-10-CM

## 2018-10-13 DIAGNOSIS — I10 Essential (primary) hypertension: Secondary | ICD-10-CM | POA: Diagnosis not present

## 2018-10-13 DIAGNOSIS — Z85828 Personal history of other malignant neoplasm of skin: Secondary | ICD-10-CM | POA: Diagnosis not present

## 2018-10-13 DIAGNOSIS — K7460 Unspecified cirrhosis of liver: Secondary | ICD-10-CM

## 2018-10-13 DIAGNOSIS — Z79899 Other long term (current) drug therapy: Secondary | ICD-10-CM | POA: Diagnosis not present

## 2018-10-13 DIAGNOSIS — Z7984 Long term (current) use of oral hypoglycemic drugs: Secondary | ICD-10-CM | POA: Diagnosis not present

## 2018-10-13 DIAGNOSIS — E119 Type 2 diabetes mellitus without complications: Secondary | ICD-10-CM | POA: Diagnosis not present

## 2018-10-13 LAB — CBC WITH DIFFERENTIAL/PLATELET
Abs Immature Granulocytes: 0.01 10*3/uL (ref 0.00–0.07)
Basophils Absolute: 0 10*3/uL (ref 0.0–0.1)
Basophils Relative: 0 %
Eosinophils Absolute: 0.1 10*3/uL (ref 0.0–0.5)
Eosinophils Relative: 3 %
HCT: 37.4 % — ABNORMAL LOW (ref 39.0–52.0)
Hemoglobin: 12.9 g/dL — ABNORMAL LOW (ref 13.0–17.0)
Immature Granulocytes: 0 %
Lymphocytes Relative: 23 %
Lymphs Abs: 0.7 10*3/uL (ref 0.7–4.0)
MCH: 33.9 pg (ref 26.0–34.0)
MCHC: 34.5 g/dL (ref 30.0–36.0)
MCV: 98.4 fL (ref 80.0–100.0)
Monocytes Absolute: 0.2 10*3/uL (ref 0.1–1.0)
Monocytes Relative: 7 %
Neutro Abs: 2.2 10*3/uL (ref 1.7–7.7)
Neutrophils Relative %: 67 %
Platelets: 81 10*3/uL — ABNORMAL LOW (ref 150–400)
RBC: 3.8 MIL/uL — ABNORMAL LOW (ref 4.22–5.81)
RDW: 14.5 % (ref 11.5–15.5)
WBC: 3.3 10*3/uL — ABNORMAL LOW (ref 4.0–10.5)
nRBC: 0 % (ref 0.0–0.2)

## 2018-10-13 LAB — IRON AND TIBC
Iron: 95 ug/dL (ref 45–182)
Saturation Ratios: 26 % (ref 17.9–39.5)
TIBC: 367 ug/dL (ref 250–450)
UIBC: 272 ug/dL

## 2018-10-13 LAB — TSH: TSH: 2.127 u[IU]/mL (ref 0.350–4.500)

## 2018-10-13 LAB — FOLATE: Folate: 26 ng/mL (ref >5.9)

## 2018-10-13 LAB — FERRITIN: Ferritin: 94 ng/mL (ref 24–336)

## 2018-10-13 LAB — TECHNOLOGIST SMEAR REVIEW: Tech Review: NORMAL

## 2018-10-13 LAB — VITAMIN B12: Vitamin B-12: 942 pg/mL — ABNORMAL HIGH (ref 180–914)

## 2018-10-25 ENCOUNTER — Inpatient Hospital Stay: Payer: BC Managed Care – PPO | Admitting: Oncology

## 2019-01-25 IMAGING — RF DG ESOPHAGUS
11 of 16 series · 14 of 24 positions shown · non-contrast
Comparison: None.

CLINICAL DATA: Swallowing difficulty for 6 months.

EXAM:
ESOPHOGRAM / BARIUM SWALLOW / BARIUM TABLET STUDY
TECHNIQUE: Combined double contrast and single contrast examination performed
using effervescent crystals, thick barium liquid, and thin barium
liquid. The patient was observed with fluoroscopy swallowing a 13 mm
barium sulphate tablet.
FLUOROSCOPY TIME:  Fluoroscopy Time:  0.9 minute
Radiation Exposure Index (if provided by the fluoroscopic device): 3
mGy
Number of Acquired Spot Images: 0

[Series 1: cp_standard · 0.26mm/px · 2 of 26 frames shown (1 of 11)]
[frame 4/26]
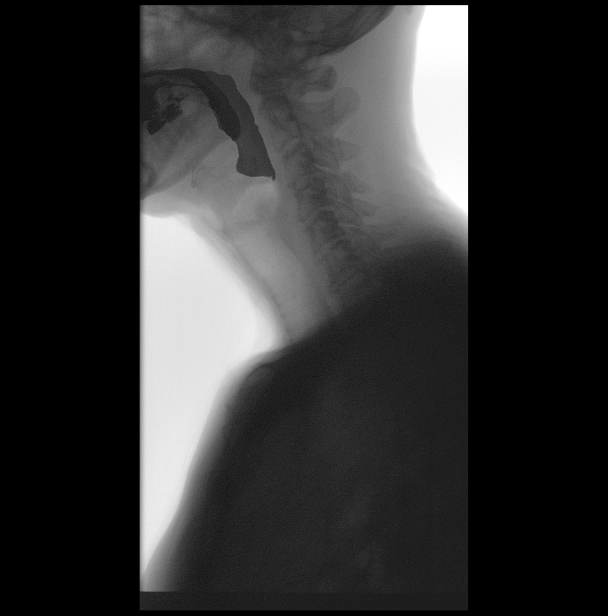
[frame 23/26]
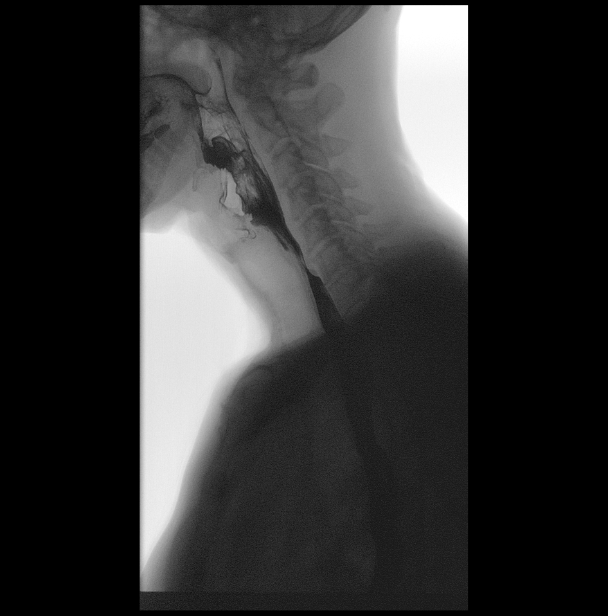

[Series 2: cp_standard · 0.26mm/px · 1 of 49 frames shown (2 of 11)]
[frame 19/49]
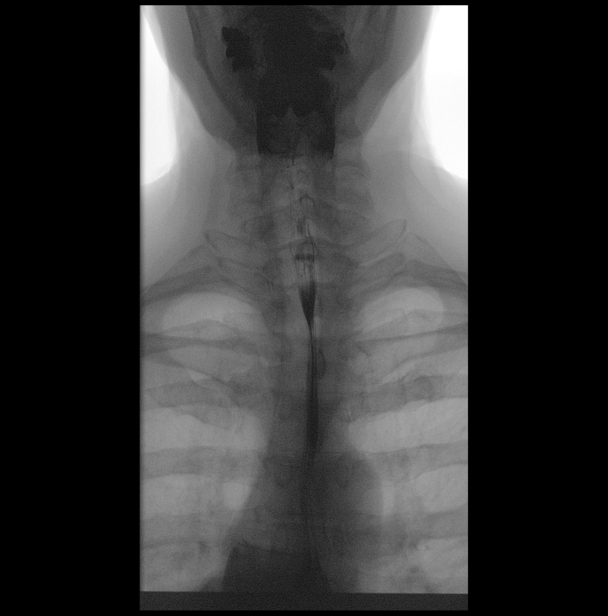

[Series 3: cp_standard · 0.26mm/px · 1 of 1 slices shown (3 of 11)]
[im 1/1]
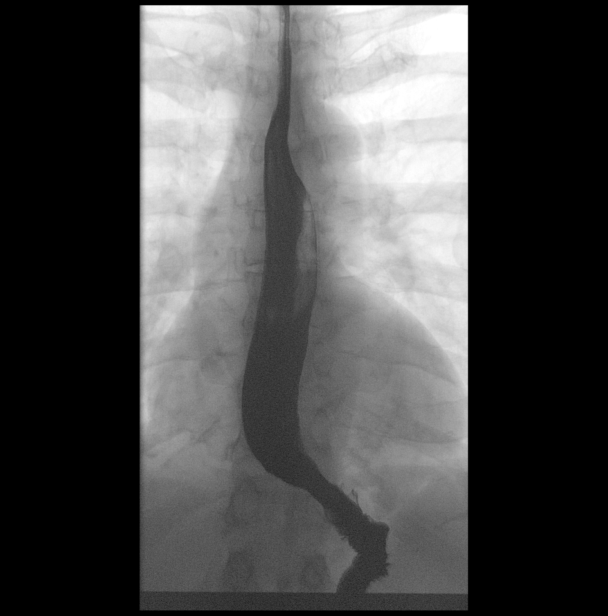

[Series 4: cp_standard · 0.26mm/px · 2 of 46 frames shown (4 of 11)]
[frame 7/46]
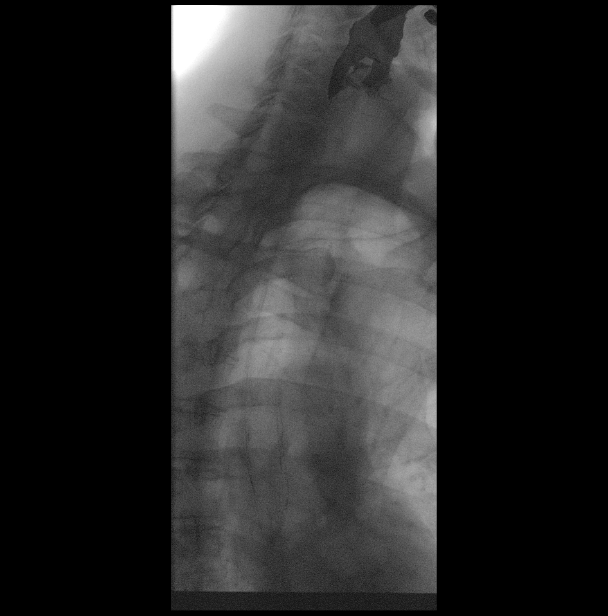
[frame 40/46]
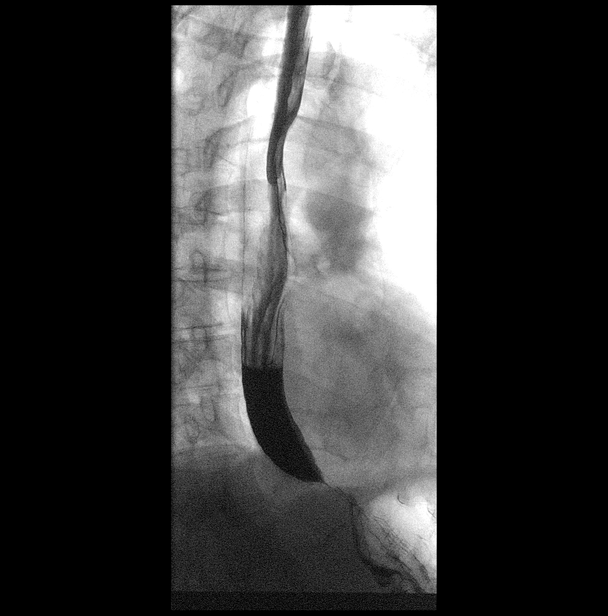

[Series 6: cp_standard · 0.26mm/px · 1 of 1 slices shown (5 of 11)]
[im 1/1]
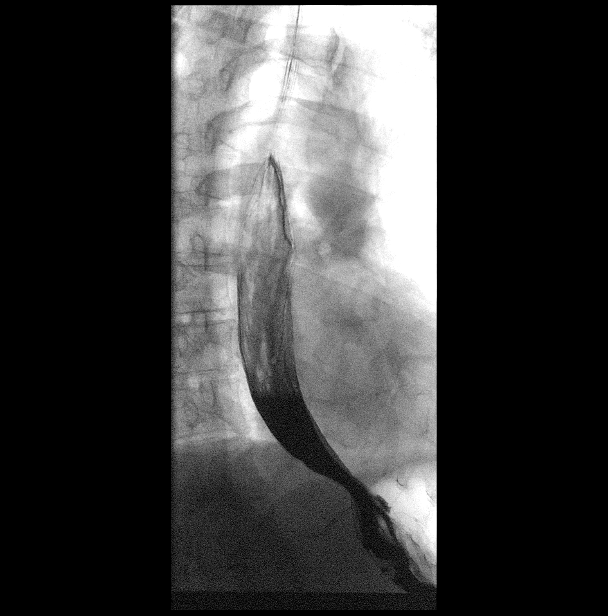

[Series 7: cp_standard · 0.26mm/px · 2 of 45 frames shown (6 of 11)]
[frame 7/45]
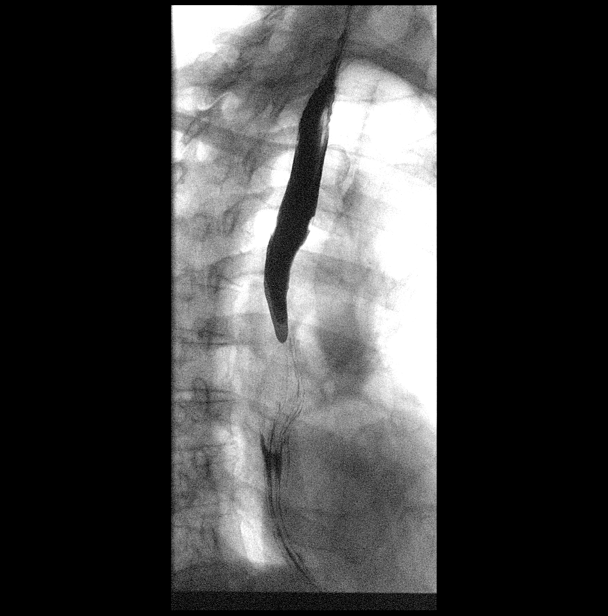
[frame 39/45]
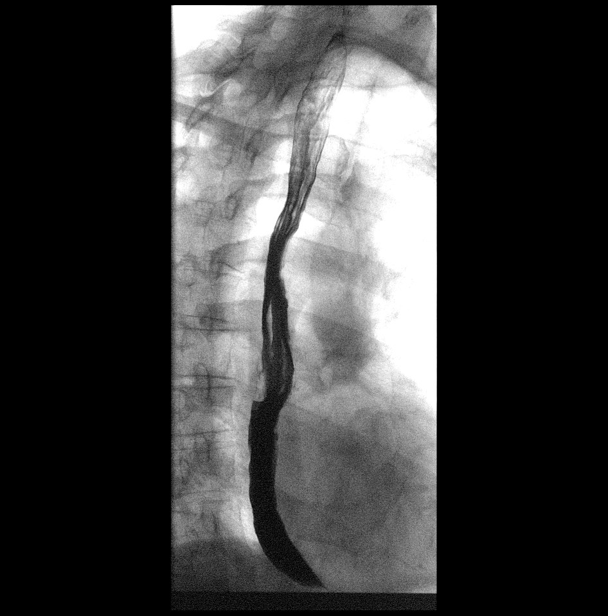

[Series 10: cp_standard · 0.26mm/px · 1 of 1 slices shown (7 of 11)]
[im 1/1]
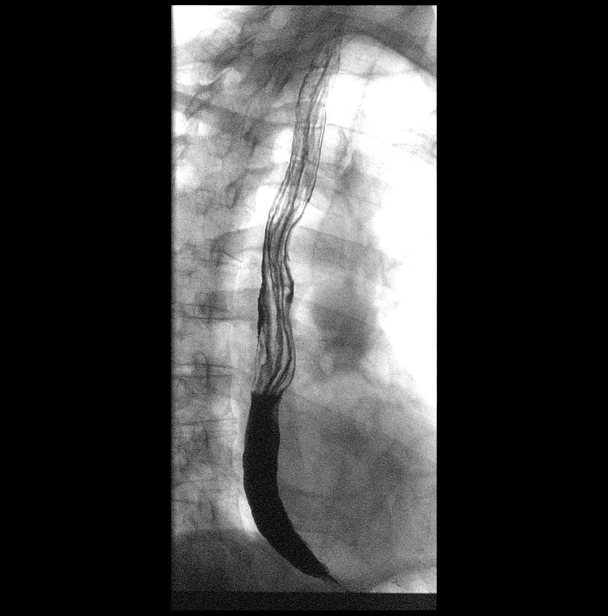

[Series 12: cp_standard · 0.26mm/px · 1 of 1 slices shown (8 of 11)]
[im 1/1]
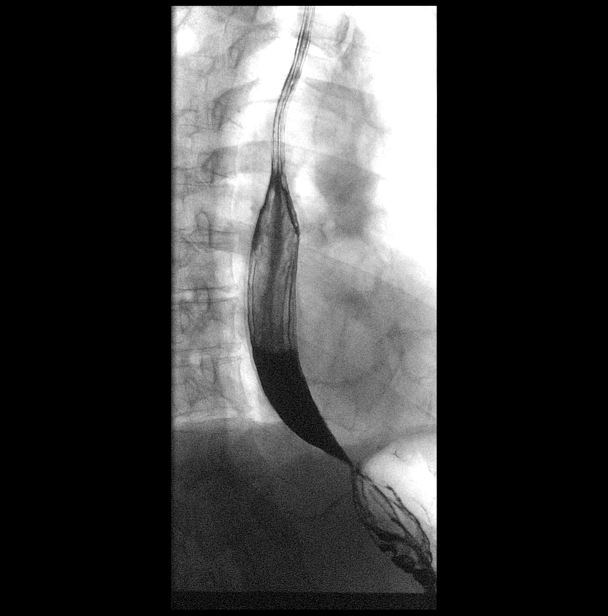

[Series 14: cp_standard · 0.27mm/px · 1 of 1 slices shown (9 of 11)]
[im 1/1]
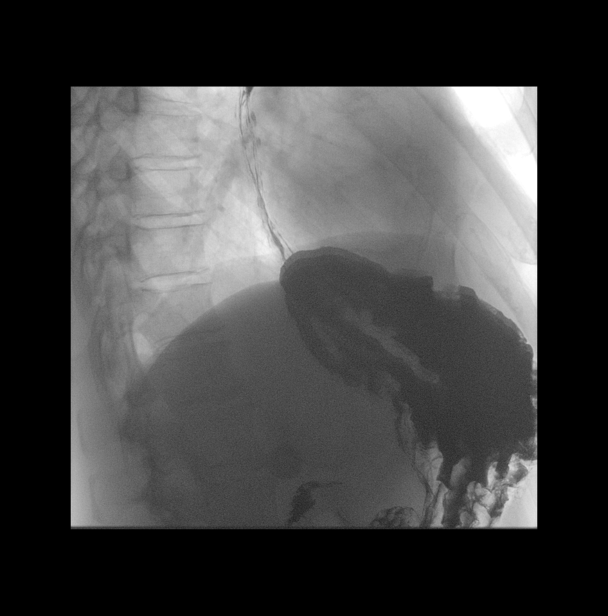

[Series 16: cp_standard · 0.28mm/px · 1 of 1 slices shown (10 of 11)]
[im 1/1]
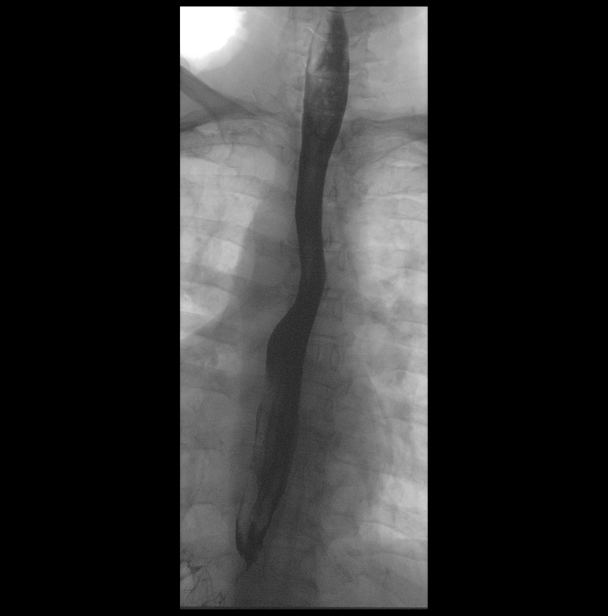

[Series 19: cp_standard · 0.28mm/px · 1 of 1 slices shown (11 of 11)]
[im 1/1]
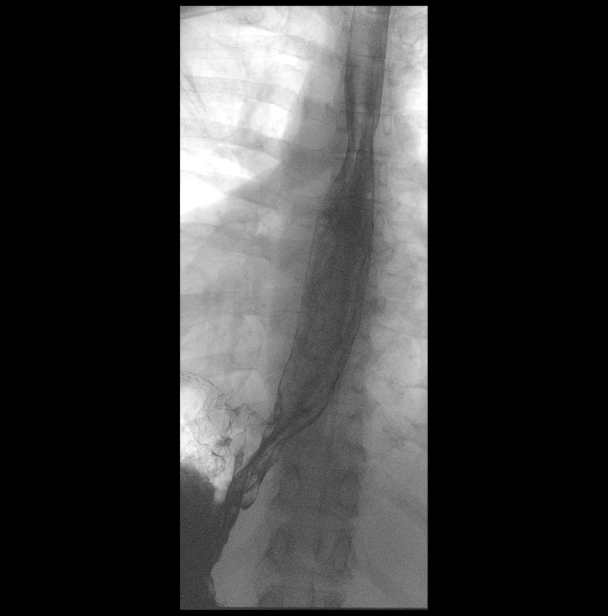

[14 of 24 positions shown; findings below may reference images not displayed]

FINDINGS: There was normal pharyngeal anatomy and motility. Flash laryngeal
penetration without tracheal aspiration. Contrast flowed freely
through the esophagus without evidence of stricture or mass. There
was normal esophageal mucosa without evidence of irregularity or
ulceration. Esophageal motility was normal. Mild gastroesophageal
reflux. No definite hiatal hernia was demonstrated.

At the end of the examination a 13 mm barium tablet was administered
which transited through the esophagus and esophagogastric junction
without delay.
IMPRESSION: 1. No esophageal stricture.
2. Mild gastroesophageal reflux.

## 2019-06-26 ENCOUNTER — Encounter: Admit: 2019-06-26 | Payer: PRIVATE HEALTH INSURANCE

## 2019-08-07 ENCOUNTER — Encounter: Admit: 2019-08-07 | Payer: PRIVATE HEALTH INSURANCE | Attending: Hepatology

## 2019-08-07 MED ORDER — SYPRINE 250 MG CAPSULE
250 mg | ORAL_CAPSULE | Freq: Two times a day (BID) | ORAL | 4 refills | Status: AC
Start: 2019-08-07 — End: 2020-08-14

## 2019-08-13 MED ORDER — XIFAXAN 550 MG TABLET
550 | Freq: Two times a day (BID) | ORAL | Status: AC
Start: 2019-08-13 — End: ?

## 2019-08-13 MED ORDER — TRIAMTERENE 37.5 MG-HYDROCHLOROTHIAZIDE 25 MG TABLET
Freq: Every day | ORAL | Status: AC
Start: 2019-08-13 — End: ?

## 2019-09-07 ENCOUNTER — Encounter: Admit: 2019-09-07 | Payer: PRIVATE HEALTH INSURANCE

## 2019-09-10 ENCOUNTER — Inpatient Hospital Stay: Admit: 2019-09-10 | Discharge: 2019-09-10 | Payer: BLUE CROSS/BLUE SHIELD

## 2019-09-10 ENCOUNTER — Ambulatory Visit: Admit: 2019-09-10 | Payer: PRIVATE HEALTH INSURANCE | Attending: Clinical

## 2019-09-10 ENCOUNTER — Ambulatory Visit: Admit: 2019-09-10 | Payer: BLUE CROSS/BLUE SHIELD | Attending: Hepatology

## 2019-09-10 ENCOUNTER — Encounter: Admit: 2019-09-10 | Payer: PRIVATE HEALTH INSURANCE | Attending: Hepatology

## 2019-09-10 DIAGNOSIS — K7290 Hepatic failure, unspecified without coma: Secondary | ICD-10-CM

## 2019-09-10 DIAGNOSIS — R7989 Other specified abnormal findings of blood chemistry: Secondary | ICD-10-CM

## 2019-09-10 DIAGNOSIS — K766 Portal hypertension: Secondary | ICD-10-CM

## 2019-09-10 DIAGNOSIS — K7682 Hepatic encephalopathy (HC Code): Secondary | ICD-10-CM

## 2019-09-10 DIAGNOSIS — Z006 Encounter for examination for normal comparison and control in clinical research program: Secondary | ICD-10-CM

## 2019-09-10 DIAGNOSIS — E119 Type 2 diabetes mellitus without complications: Secondary | ICD-10-CM

## 2019-09-10 DIAGNOSIS — R945 Abnormal results of liver function studies: Secondary | ICD-10-CM

## 2019-09-10 DIAGNOSIS — I10 Essential (primary) hypertension: Secondary | ICD-10-CM

## 2019-09-10 LAB — CBC WITH AUTO DIFFERENTIAL
BKR WAM ABSOLUTE IMMATURE GRANULOCYTES: 0 x 1000/ÂµL (ref 0.0–0.3)
BKR WAM ABSOLUTE LYMPHOCYTE COUNT: 0.7 x 1000/ÂµL — ABNORMAL LOW (ref 1.0–4.0)
BKR WAM ABSOLUTE NRBC: 0 x 1000/ÂµL (ref 0.0–0.0)
BKR WAM ANALYZER ANC: 2.4 x 1000/ÂµL (ref 1.0–11.0)
BKR WAM BASOPHIL ABSOLUTE COUNT: 0 x 1000/ÂµL (ref 0.0–0.0)
BKR WAM BASOPHILS: 0.3 % (ref 0.0–4.0)
BKR WAM EOSINOPHIL ABSOLUTE COUNT: 0.1 x 1000/ÂµL (ref 0.0–1.0)
BKR WAM EOSINOPHILS: 3.7 % (ref 0.0–7.0)
BKR WAM HEMATOCRIT: 39.6 % (ref 37.0–52.0)
BKR WAM HEMOGLOBIN: 13.4 g/dL (ref 12.0–18.0)
BKR WAM IMMATURE GRANULOCYTES: 0.3 % (ref 0.0–3.0)
BKR WAM LYMPHOCYTES: 18.8 % (ref 8.0–49.0)
BKR WAM MCH (PG): 34.2 pg — ABNORMAL HIGH (ref 27.0–31.0)
BKR WAM MCHC: 33.8 g/dL (ref 31.0–36.0)
BKR WAM MCV: 101 fL — ABNORMAL HIGH (ref 78.0–94.0)
BKR WAM MONOCYTE ABSOLUTE COUNT: 0.3 x 1000/ÂµL (ref 0.0–2.0)
BKR WAM MONOCYTES: 8.3 % (ref 4.0–15.0)
BKR WAM MPV: 11.7 fL — ABNORMAL HIGH (ref 6.0–11.0)
BKR WAM NEUTROPHILS: 68.6 % (ref 37.0–84.0)
BKR WAM NUCLEATED RED BLOOD CELLS: 0 % (ref 0.0–1.0)
BKR WAM PLATELETS: 77 x1000/ÂµL — ABNORMAL LOW (ref 140–440)
BKR WAM RDW-CV: 15.1 % — ABNORMAL HIGH (ref 11.5–14.5)
BKR WAM RED BLOOD CELL COUNT: 3.9 M/ÂµL (ref 3.8–5.9)
BKR WAM WHITE BLOOD CELL COUNT: 3.5 x1000/ÂµL — ABNORMAL LOW (ref 4.0–10.0)

## 2019-09-10 LAB — PROTIME AND INR
BKR INR: 1.32 — ABNORMAL HIGH (ref 0.88–1.15)
BKR PROTHROMBIN TIME: 13.9 s — ABNORMAL HIGH (ref 9.6–12.3)

## 2019-09-10 LAB — COMPREHENSIVE METABOLIC PANEL
BKR A/G RATIO: 1.8 (ref 1.0–2.2)
BKR ALANINE AMINOTRANSFERASE (ALT): 45 U/L (ref 9–59)
BKR ALBUMIN: 3.9 g/dL (ref 3.6–4.9)
BKR ALKALINE PHOSPHATASE: 114 U/L (ref 9–122)
BKR ANION GAP: 12 (ref 7–17)
BKR ASPARTATE AMINOTRANSFERASE (AST): 47 U/L — ABNORMAL HIGH (ref 10–35)
BKR AST/ALT RATIO: 1
BKR BILIRUBIN TOTAL: 1.6 mg/dL — ABNORMAL HIGH (ref <=1.2)
BKR BLOOD UREA NITROGEN: 21 mg/dL (ref 8–23)
BKR BUN / CREAT RATIO: 23.6 — ABNORMAL HIGH (ref 8.0–23.0)
BKR CALCIUM: 9.8 mg/dL (ref 8.8–10.2)
BKR CHLORIDE: 108 mmol/L — ABNORMAL HIGH (ref 98–107)
BKR CO2: 24 mmol/L (ref 20–30)
BKR CREATININE: 0.89 mg/dL (ref 0.40–1.30)
BKR EGFR (AFR AMER): >60 mL/min/{1.73_m2} (ref >60)
BKR EGFR (NON AFRICAN AMERICAN): >60 mL/min/{1.73_m2} (ref >60)
BKR GLOBULIN: 2.2 g/dL — ABNORMAL LOW (ref 2.3–3.5)
BKR GLUCOSE: 167 mg/dL — ABNORMAL HIGH (ref 70–100)
BKR POTASSIUM: 4 mmol/L (ref 3.3–5.1)
BKR PROTEIN TOTAL: 6.1 g/dL — ABNORMAL LOW (ref 6.6–8.7)
BKR SODIUM: 144 mmol/L (ref 136–144)

## 2019-09-10 LAB — BILIRUBIN, DIRECT: BKR BILIRUBIN DIRECT: 0.4 mg/dL — ABNORMAL HIGH (ref <=0.3)

## 2019-09-10 LAB — IMMATURE PLATELET FRACTION (BH GH LMW YH)
BKR WAM IPF, ABSOLUTE: 2.5 x1000/ÂµL (ref <20.0)
BKR WAM IPF: 3.3 % (ref 1.2–8.6)

## 2019-09-10 LAB — CERULOPLASMIN: BKR CERULOPLASMIN: <3 mg/dL — ABNORMAL LOW (ref 18–51)

## 2019-09-10 LAB — GAMMA GT: BKR GAMMA GLUTAMYL TRANSFERASE: 58 U/L — ABNORMAL HIGH (ref <48)

## 2019-09-10 MED ORDER — POLYETHYLENE GLYCOL 3350 17 GRAM ORAL POWDER PACKET
17 gram | Freq: Every day | ORAL | Status: AC
Start: 2019-09-10 — End: ?

## 2019-09-10 NOTE — Progress Notes
Subjective:   Benjamin Evans is a 69 y.o. 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  He underwent evaluation and eventually underwent an open liver biopsy.  He also recalls having hematuria. He did have KF rings and a low ceruloplasmin, and elevated liver copper. He was treated with sulfurated potash, and then later with d-penicillamine. He never reported any neurologic symptoms at the outset.  He reports that the highest dosage of the penicillamine was 1 gram per day. He was on this until ~ 2000 when he was changed to trientine. He then went onto zinc but had continued elevated liver tests (ALT, AST) and was restarted with a single additional dosage of the syprine, 500 mg, daily along with the zinc with normalization of the liver tests.  He changed to Syprine alone due to some GI side effect from the zinc.  He continues on once daily Syprine at 1000 mg daily in AM.  Other medical problem is treatment for hyperglycemia that was begun January 2015. He remains on oral agents. He has had ongoing HCC surveillance by ultrasound and EGD showing small esophageal varices for which he was started on non-selective beta blocker. The patient previously was on zinc alone in August 03, 2015, using zinc sulfate. He has some degree of dyspepsia with the zinc, and therefore he  changed back to using Syprine, 1000 mg total per day.  He has developed portal HTN and esophageal varices. He had encephalopathy that has responded well to lactulose therapy.  He is on beta blocker for the varices. Interval update: Received both immunization against COVID [employer]) in March, no adverse reactionHe takes the trientine 1000 mg as one daily dosage in AM apart from foodHe is taking lactulose interspersed with MiraLax and also taking rifaximin Benjamin Evans has a current medication list which includes the following prescription(s): ALPRAZolam (XANAX) 0.5 MG tablet - 0.25 mg, canagliflozin (INVOKANA) 300 mg Tab tablet - Take 1 tablet by mouth, cholecalciferol (VITAMIN D) 1,000 unit tablet - Take 1,000 Units by mouth daily, escitalopram oxalate (LEXAPRO) 10 mg tablet - Take 10 mg by mouth, glimepiride (AMARYL) 2 MG tablet - Take 2 mg by mouth 2 (two) times daily with breakfast and dinner, lactulose (CHRONULAC) 10 gram/15 mL solution - Take 45 mLs by mouth, multivitamin capsule - Take 1 capsule by mouth daily, nadolol (CORGARD) 20 MG tablet - Take 20 mg by mouth daily, pantoprazole (PROTONIX) 20 mg tablet - Take 20 mg by mouth daily, polyethylene glycol (MIRALAX) 17 gram packet - Take 17 g by mouth daily. Mix in 8 ounces of water, juice, soda, coffee or tea prior to taking, SYPRINE 250 mg capsule - Take 2 capsules (500 mg total) by mouth 2 (two) times daily (0800, 1800), triamterene-hydroCHLOROthiazide (MAXZIDE-25) 37.5-25 mg per tablet - Take 1 tablet by mouth daily, XIFAXAN 550 mg tablet - Take 550 mg by mouth 2 (two) times daily (0800, 1800), and magnesium 250 mg Tab - Take by mouth.Benjamin Evans is allergic to ace inhibitors and serotonin 5ht-3 antagonists.Review of Systems Constitutional: Negative.  HENT: Negative.  Eyes: Negative.  Respiratory: Negative.  Cardiovascular: Negative.       Hypertension Gastrointestinal:      Wilson's diseaseSplenomegalyDyspepsia with zinc sulfate, changed to SyprineHepatic encephalopathy - on lactuloseEndocrine: Negative.       Diabetes on oral agents Genitourinary: Negative.  Musculoskeletal: Negative.  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  Neurological: Negative.  Hematological: Negative.  Psychiatric/Behavioral: Negative.    Objective:  BP 123/65 (Site: r a, Position: Sitting, Cuff Size: Medium)  - Pulse 67  - Temp 97.4 ?F (36.3 ?C) (Temporal)  - Ht 5' 10.87 (1.8 m)  - Wt 68.9 kg  - SpO2 96%  - BMI 21.28 kg/m?  Wt Readings from Last 3 Encounters: 09/10/19 68.9 kg 05/15/18 71.7 kg 05/15/18 71.7 kg Physical Exam Constitutional: He is oriented to person, place, and time. He appears well-developed and well-nourished. HENT: Head: Normocephalic and atraumatic. Eyes: Conjunctivae and EOM are normal. Pupils are equal, round, and reactive to light. Neck: Normal range of motion. Neck supple. Cardiovascular: Normal rate, regular rhythm and normal heart sounds.  Pulmonary/Chest: Effort normal and breath sounds normal. Abdominal: Soft. Bowel sounds are normal. There is splenomegaly and hepatomegaly. Healed right upper quadrant incision.Spleen palpable at costal marginLiver left lobe slight prominence  - exam unchanged from prior visit.No ascitesMusculoskeletal: Normal range of motion. Neurological: He is alert and oriented to person, place, and time. He has normal reflexes. No tremor or asterixis, grade 1 PSENormal f to n, no tremor, normal fine motor and motor, normal Romberg and tandem gait Memory - remembered parts of 3 items from address but had missesSkin: Skin is warm and dry. progeric change neck Psychiatric: He has a normal mood and affect. His behavior is normal. Judgment and thought content normal. 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 On exam he still has splenomegaly and no ascites and no evidence of any neurological findings. 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 He was on combination therapy with zinc and with a low dosage of Syprine (trientine) due to his prior abnormal liver tests, improved on this, and has been on single daily dosage Syprine since May 2014.  He was doing well - normal ALT and AST, however due to cost he changed back to zinc alone, but this caused some degree of dyspepsia and so he transitioned back to Syprine monotherapy. He since is doing better with this medication and is without symptoms of dyspepsia, however during the prior period he had some progression of his liver disease and signs of portal HTN.  He had EGD with small varices and was placed on nadolol. 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 For encephalopathy he was started on lactulose. Recommend:Continue with current trientine dosage - await repeat labs, prior labs were stableContinue with labs for Wilson disease monitoring and ultrasound every 6 months for [facility] surveillance - ultrasounds have been negative for HCCDiscussed  continuing to avoid high copper foodsContinue abstinence from alcoholFor hepatic encephalopathy discussed ongoing use of lactulose and how to adjust the dosage and integrate MiraLax, and continue rifaximin  (550 mg twice daily)   Addendum: Labs were reviewed. MELD is 11.  Await further copper testing. MELD-Na score: 11 at 09/10/2019 12:04 PMMELD score: 11 at 09/10/2019 12:04 PMCalculated from:Serum Creatinine: 0.89 mg/dL (Rounded to 1 mg/dL) at 10/12/3327 51:88 PMSerum Sodium: 144 mmol/L (Rounded to 137 mmol/L) at 09/10/2019 12:04 PMTotal Bilirubin: 1.6 mg/dL at 08/08/6604 30:16 PMINR(ratio): 1.32 at 09/10/2019 12:04 PMAge: 68 years 4 monthsMichael Clova Evans MD5/07/2019 4:42 PM

## 2019-09-10 NOTE — Progress Notes
VIDEO TELEHEALTH VISIT: This clinician is part of the telehealth program and is conducting this visit in a currently approved location. For this visit the clinician and patient were present via interactive audio & video telecommunications system that permits real-time communications.Patient/parent or guardian consent given for video visit: Yes State patient is located in: [place] clinician is appropriately licensed in the above state to provide care for this visit. Other individuals present during the telehealth encounter and their role/relation: spouseVincent [id number]/3/2021Patient seen per research protocol.Administered [facility] 8.3, MINI7.0.2Susan Riley Nearing, PhD, DBSMDepartment of Psychiatry

## 2019-09-12 LAB — COPPER, SERUM: COPPER: 7 ug/dL — ABNORMAL LOW (ref 70–175)

## 2020-02-20 ENCOUNTER — Other Ambulatory Visit: Payer: Self-pay

## 2020-02-20 ENCOUNTER — Encounter: Payer: Self-pay | Admitting: Ophthalmology

## 2020-02-21 ENCOUNTER — Encounter: Payer: Self-pay | Admitting: Ophthalmology

## 2020-02-25 ENCOUNTER — Other Ambulatory Visit
Admission: RE | Admit: 2020-02-25 | Discharge: 2020-02-25 | Disposition: A | Discharge status: Home / Self Care | Payer: BLUE CROSS/BLUE SHIELD | Source: Ambulatory Visit | Attending: Ophthalmology | Admitting: Ophthalmology

## 2020-02-25 ENCOUNTER — Other Ambulatory Visit: Payer: Self-pay

## 2020-02-25 DIAGNOSIS — Z20822 Contact with and (suspected) exposure to covid-19: Secondary | ICD-10-CM | POA: Diagnosis not present

## 2020-02-25 DIAGNOSIS — Z01812 Encounter for preprocedural laboratory examination: Secondary | ICD-10-CM | POA: Diagnosis not present

## 2020-02-25 LAB — SARS CORONAVIRUS 2 (TAT 6-24 HRS): SARS Coronavirus 2: NEGATIVE

## 2020-02-25 NOTE — Discharge Instructions (Signed)

## 2020-02-27 ENCOUNTER — Encounter: Payer: Self-pay | Admitting: Ophthalmology

## 2020-02-27 ENCOUNTER — Ambulatory Visit: Payer: BLUE CROSS/BLUE SHIELD | Admitting: Anesthesiology

## 2020-02-27 ENCOUNTER — Other Ambulatory Visit: Payer: Self-pay

## 2020-02-27 ENCOUNTER — Encounter
Admission: RE | Disposition: A | Discharge status: Home / Self Care | Payer: Self-pay | Source: Home / Self Care | Attending: Ophthalmology

## 2020-02-27 ENCOUNTER — Ambulatory Visit
Admission: RE | Admit: 2020-02-27 | Discharge: 2020-02-27 | Disposition: A | Discharge status: Home / Self Care | Payer: BLUE CROSS/BLUE SHIELD | Attending: Ophthalmology | Admitting: Ophthalmology

## 2020-02-27 DIAGNOSIS — F419 Anxiety disorder, unspecified: Secondary | ICD-10-CM | POA: Diagnosis not present

## 2020-02-27 DIAGNOSIS — E1136 Type 2 diabetes mellitus with diabetic cataract: Secondary | ICD-10-CM | POA: Insufficient documentation

## 2020-02-27 DIAGNOSIS — K219 Gastro-esophageal reflux disease without esophagitis: Secondary | ICD-10-CM | POA: Insufficient documentation

## 2020-02-27 DIAGNOSIS — M1990 Unspecified osteoarthritis, unspecified site: Secondary | ICD-10-CM | POA: Diagnosis not present

## 2020-02-27 DIAGNOSIS — Z79899 Other long term (current) drug therapy: Secondary | ICD-10-CM | POA: Insufficient documentation

## 2020-02-27 DIAGNOSIS — I10 Essential (primary) hypertension: Secondary | ICD-10-CM | POA: Insufficient documentation

## 2020-02-27 DIAGNOSIS — H2511 Age-related nuclear cataract, right eye: Secondary | ICD-10-CM | POA: Insufficient documentation

## 2020-02-27 HISTORY — PX: CATARACT EXTRACTION W/PHACO: SHX586

## 2020-02-27 HISTORY — DX: Unspecified cirrhosis of liver: K74.60

## 2020-02-27 LAB — GLUCOSE, CAPILLARY
Glucose-Capillary: 114 mg/dL — ABNORMAL HIGH (ref 70–99)
Glucose-Capillary: 106 mg/dL — ABNORMAL HIGH (ref 70–99)

## 2020-02-27 SURGERY — PHACOEMULSIFICATION, CATARACT, WITH IOL INSERTION
Anesthesia: Monitor Anesthesia Care | Site: Eye | Laterality: Right

## 2020-02-27 MED ORDER — MOXIFLOXACIN HCL 0.5 % OP SOLN
OPHTHALMIC | Status: DC | PRN
  Administered 2020-02-27: 0.2 mL via OPHTHALMIC

## 2020-02-27 MED ORDER — FENTANYL CITRATE (PF) 100 MCG/2ML IJ SOLN
INTRAMUSCULAR | Status: DC | PRN
  Administered 2020-02-27 (×2): 50 ug via INTRAVENOUS

## 2020-02-27 MED ORDER — LIDOCAINE HCL (PF) 2 % IJ SOLN
INTRAOCULAR | Status: DC | PRN
  Administered 2020-02-27: 1 mL

## 2020-02-27 MED ORDER — EPINEPHRINE PF 1 MG/ML IJ SOLN
INTRAOCULAR | Status: DC | PRN
  Administered 2020-02-27: 57 mL via OPHTHALMIC

## 2020-02-27 MED ORDER — TETRACAINE HCL 0.5 % OP SOLN
1.0000 [drp] | OPHTHALMIC | Status: DC | PRN
  Administered 2020-02-27 (×3): 1 [drp] via OPHTHALMIC

## 2020-02-27 MED ORDER — ARMC OPHTHALMIC DILATING DROPS
1.0000 "application " | OPHTHALMIC | Status: DC | PRN
  Administered 2020-02-27 (×3): 1 via OPHTHALMIC

## 2020-02-27 MED ORDER — BRIMONIDINE TARTRATE-TIMOLOL 0.2-0.5 % OP SOLN
OPHTHALMIC | Status: DC | PRN
  Administered 2020-02-27: 1 [drp] via OPHTHALMIC

## 2020-02-27 MED ORDER — MOXIFLOXACIN HCL 0.5 % OP SOLN
1.0000 [drp] | OPHTHALMIC | Status: DC | PRN
  Administered 2020-02-27 (×3): 1 [drp] via OPHTHALMIC

## 2020-02-27 MED ORDER — NA HYALUR & NA CHOND-NA HYALUR 0.4-0.35 ML IO KIT
PACK | INTRAOCULAR | Status: DC | PRN
  Administered 2020-02-27: 1 mL via INTRAOCULAR

## 2020-02-27 MED ORDER — ACETAMINOPHEN 160 MG/5ML PO SOLN: 325.0000 mg | Freq: Once | ORAL | Status: DC

## 2020-02-27 MED ORDER — LACTATED RINGERS IV SOLN: INTRAVENOUS | Status: DC

## 2020-02-27 MED ORDER — MIDAZOLAM HCL 2 MG/2ML IJ SOLN
INTRAMUSCULAR | Status: DC | PRN
  Administered 2020-02-27: 2 mg via INTRAVENOUS

## 2020-02-27 MED ORDER — ACETAMINOPHEN 325 MG PO TABS: 325.0000 mg | ORAL_TABLET | Freq: Once | ORAL | Status: DC

## 2020-02-27 SURGICAL SUPPLY — 22 items
CANNULA ANT/CHMB 27G (MISCELLANEOUS) ×1 IMPLANT
CANNULA ANT/CHMB 27GA (MISCELLANEOUS) ×3 IMPLANT
GLOVE SURG LX 7.5 STRW (GLOVE) ×2
GLOVE SURG LX STRL 7.5 STRW (GLOVE) ×1 IMPLANT
GLOVE SURG TRIUMPH 8.0 PF LTX (GLOVE) ×3 IMPLANT
GOWN STRL REUS W/ TWL LRG LVL3 (GOWN DISPOSABLE) ×2 IMPLANT
GOWN STRL REUS W/TWL LRG LVL3 (GOWN DISPOSABLE) ×6
LENS IOL TECNIS EYHANCE 23.5 (Intraocular Lens) ×2 IMPLANT
MARKER SKIN DUAL TIP RULER LAB (MISCELLANEOUS) ×3 IMPLANT
NDL CAPSULORHEX 25GA (NEEDLE) ×1 IMPLANT
NDL FILTER BLUNT 18X1 1/2 (NEEDLE) ×2 IMPLANT
NEEDLE CAPSULORHEX 25GA (NEEDLE) ×3 IMPLANT
NEEDLE FILTER BLUNT 18X 1/2SAF (NEEDLE) ×4
NEEDLE FILTER BLUNT 18X1 1/2 (NEEDLE) ×2 IMPLANT
PACK CATARACT BRASINGTON (MISCELLANEOUS) ×3 IMPLANT
PACK EYE AFTER SURG (MISCELLANEOUS) ×3 IMPLANT
PACK OPTHALMIC (MISCELLANEOUS) ×3 IMPLANT
SOLUTION OPHTHALMIC SALT (MISCELLANEOUS) ×3 IMPLANT
SYR 3ML LL SCALE MARK (SYRINGE) ×6 IMPLANT
SYR TB 1ML LUER SLIP (SYRINGE) ×3 IMPLANT
WATER STERILE IRR 250ML POUR (IV SOLUTION) ×3 IMPLANT
WIPE NON LINTING 3.25X3.25 (MISCELLANEOUS) ×3 IMPLANT

## 2020-02-27 NOTE — Anesthesia Procedure Notes (Signed)
Procedure Name: MAC Date/Time: 02/27/2020 12:28 PM Performed by: Silvana Newness, CRNA Pre-anesthesia Checklist: Patient identified, Emergency Drugs available, Suction available, Patient being monitored and Timeout performed Patient Re-evaluated:Patient Re-evaluated prior to induction Oxygen Delivery Method: Nasal cannula Placement Confirmation: positive ETCO2

## 2020-02-27 NOTE — Anesthesia Preprocedure Evaluation (Signed)
Anesthesia Evaluation  Patient identified by MRN, date of birth, ID band Patient awake    Reviewed: Allergy & Precautions, H&P , NPO status , Patient's Chart, lab work & pertinent test results  Airway Mallampati: II  TM Distance: >3 FB Neck ROM: full    Dental no notable dental hx.    Pulmonary    Pulmonary exam normal breath sounds clear to auscultation       Cardiovascular hypertension, Normal cardiovascular exam Rhythm:regular Rate:Normal     Neuro/Psych    GI/Hepatic GERD  ,(+) Cirrhosis       , Wilson Disease   Endo/Other  diabetes  Renal/GU      Musculoskeletal   Abdominal   Peds  Hematology   Anesthesia Other Findings   Reproductive/Obstetrics                             Anesthesia Physical Anesthesia Plan  ASA: III  Anesthesia Plan: MAC   Post-op Pain Management:    Induction:   PONV Risk Score and Plan: 1 and Treatment may vary due to age or medical condition, TIVA and Midazolam  Airway Management Planned:   Additional Equipment:   Intra-op Plan:   Post-operative Plan:   Informed Consent: I have reviewed the patients History and Physical, chart, labs and discussed the procedure including the risks, benefits and alternatives for the proposed anesthesia with the patient or authorized representative who has indicated his/her understanding and acceptance.     Dental Advisory Given  Plan Discussed with: CRNA  Anesthesia Plan Comments:         Anesthesia Quick Evaluation

## 2020-02-27 NOTE — Op Note (Signed)
LOCATION:  [place]   PREOPERATIVE DIAGNOSIS:    Nuclear sclerotic cataract right eye. H25.11   POSTOPERATIVE DIAGNOSIS:  Nuclear sclerotic cataract right eye.     PROCEDURE:  Phacoemusification with posterior chamber intraocular lens placement of the right eye   ULTRASOUND TIME: Procedure(s) with comments: CATARACT EXTRACTION PHACO AND INTRAOCULAR LENS PLACEMENT (IOC) RIGHT DIABETIC 3.78   00:55.4 6.8% (Right) - Diabetic - oral meds  LENS:   Implant Name Type Inv. Item Serial No. Manufacturer Lot No. LRB No. Used Action  LENS IOL TECNIS EYHANCE 23.5 - [id number] Intraocular Lens LENS IOL TECNIS EYHANCE 23.5 [id number] [employer]OH[employer]ON   Right 1 Implanted         SURGEON:  Wyonia Hough, MD   ANESTHESIA:  Topical with tetracaine drops and 2% Xylocaine jelly, augmented with 1% preservative-free intracameral lidocaine.    COMPLICATIONS:  None.   DESCRIPTION OF PROCEDURE:  The patient was identified in the holding room and transported to the operating room and placed in the supine position under the operating microscope.  The right eye was identified as the operative eye and it was prepped and draped in the usual sterile ophthalmic fashion.   A 1 millimeter clear-corneal paracentesis was made at the 12:00 position.  0.5 ml of preservative-free 1% lidocaine was injected into the anterior chamber. The anterior chamber was filled with Viscoat viscoelastic.  A 2.4 millimeter keratome was used to make a near-clear corneal incision at the 9:00 position.  A curvilinear capsulorrhexis was made with a cystotome and capsulorrhexis forceps.  Balanced salt solution was used to hydrodissect and hydrodelineate the nucleus.   Phacoemulsification was then used in stop and chop fashion to remove the lens nucleus and epinucleus.  The remaining cortex was then removed using the irrigation and aspiration handpiece. Provisc was then placed into the capsular bag to distend it for lens placement.  A  lens was then injected into the capsular bag.  The remaining viscoelastic was aspirated.   Wounds were hydrated with balanced salt solution.  The anterior chamber was inflated to a physiologic pressure with balanced salt solution.  No wound leaks were noted. Vigamox 0.2 ml of a 1mg  per ml solution was injected into the anterior chamber for a dose of 0.2 mg of intracameral antibiotic at the completion of the case.   Timolol and Brimonidine drops were applied to the eye.  The patient was taken to the recovery room in stable condition without complications of anesthesia or surgery.   Amanda Steuart 02/27/2020, 12:43 PM

## 2020-02-27 NOTE — H&P (Signed)

## 2020-02-27 NOTE — Anesthesia Postprocedure Evaluation (Signed)
Anesthesia Post Note  Patient: Jack Powell  Procedure(s) Performed: CATARACT EXTRACTION PHACO AND INTRAOCULAR LENS PLACEMENT (IOC) RIGHT DIABETIC 3.78   00:55.4 6.8% (Right Eye)     Patient location during evaluation: PACU Anesthesia Type: MAC Level of consciousness: awake and alert and oriented Pain management: satisfactory to patient Vital Signs Assessment: post-procedure vital signs reviewed and stable Respiratory status: spontaneous breathing, nonlabored ventilation and respiratory function stable Cardiovascular status: blood pressure returned to baseline and stable Postop Assessment: Adequate PO intake and No signs of nausea or vomiting Anesthetic complications: no   No complications documented.  Raliegh Ip

## 2020-02-27 NOTE — Transfer of Care (Signed)
Immediate Anesthesia Transfer of Care Note  Patient: Jack Powell  Procedure(s) Performed: CATARACT EXTRACTION PHACO AND INTRAOCULAR LENS PLACEMENT (IOC) RIGHT DIABETIC 3.78   00:55.4 6.8% (Right Eye)  Patient Location: PACU  Anesthesia Type: MAC  Level of Consciousness: awake, alert  and patient cooperative  Airway and Oxygen Therapy: Patient Spontanous Breathing and Patient connected to supplemental oxygen  Post-op Assessment: Post-op Vital signs reviewed, Patient's Cardiovascular Status Stable, Respiratory Function Stable, Patent Airway and No signs of Nausea or vomiting  Post-op Vital Signs: Reviewed and stable  Complications: No complications documented.

## 2020-02-28 ENCOUNTER — Encounter: Payer: Self-pay | Admitting: Ophthalmology

## 2020-03-05 ENCOUNTER — Encounter: Payer: Self-pay | Admitting: Ophthalmology

## 2020-03-05 ENCOUNTER — Other Ambulatory Visit: Payer: Self-pay

## 2020-03-10 ENCOUNTER — Other Ambulatory Visit: Payer: Self-pay

## 2020-03-10 ENCOUNTER — Other Ambulatory Visit
Admission: RE | Admit: 2020-03-10 | Discharge: 2020-03-10 | Disposition: A | Discharge status: Home / Self Care | Payer: BC Managed Care – PPO | Source: Ambulatory Visit | Attending: Ophthalmology | Admitting: Ophthalmology

## 2020-03-10 DIAGNOSIS — Z01818 Encounter for other preprocedural examination: Secondary | ICD-10-CM | POA: Insufficient documentation

## 2020-03-10 DIAGNOSIS — Z20822 Contact with and (suspected) exposure to covid-19: Secondary | ICD-10-CM | POA: Diagnosis not present

## 2020-03-10 LAB — SARS CORONAVIRUS 2 (TAT 6-24 HRS): SARS Coronavirus 2: NEGATIVE

## 2020-03-11 NOTE — Discharge Instructions (Signed)

## 2020-03-12 ENCOUNTER — Encounter: Payer: Self-pay | Admitting: Ophthalmology

## 2020-03-12 ENCOUNTER — Ambulatory Visit: Payer: BC Managed Care – PPO | Admitting: Anesthesiology

## 2020-03-12 ENCOUNTER — Ambulatory Visit
Admission: RE | Admit: 2020-03-12 | Discharge: 2020-03-12 | Disposition: A | Discharge status: Home / Self Care | Payer: BC Managed Care – PPO | Attending: Ophthalmology | Admitting: Ophthalmology

## 2020-03-12 ENCOUNTER — Encounter
Admission: RE | Disposition: A | Discharge status: Home / Self Care | Payer: Self-pay | Source: Home / Self Care | Attending: Ophthalmology

## 2020-03-12 ENCOUNTER — Other Ambulatory Visit: Payer: Self-pay

## 2020-03-12 DIAGNOSIS — Z9841 Cataract extraction status, right eye: Secondary | ICD-10-CM | POA: Insufficient documentation

## 2020-03-12 DIAGNOSIS — I10 Essential (primary) hypertension: Secondary | ICD-10-CM | POA: Diagnosis not present

## 2020-03-12 DIAGNOSIS — H2512 Age-related nuclear cataract, left eye: Secondary | ICD-10-CM | POA: Diagnosis present

## 2020-03-12 DIAGNOSIS — Z880 Allergy status to penicillin: Secondary | ICD-10-CM | POA: Diagnosis not present

## 2020-03-12 DIAGNOSIS — K219 Gastro-esophageal reflux disease without esophagitis: Secondary | ICD-10-CM | POA: Insufficient documentation

## 2020-03-12 DIAGNOSIS — E1136 Type 2 diabetes mellitus with diabetic cataract: Secondary | ICD-10-CM | POA: Diagnosis not present

## 2020-03-12 HISTORY — PX: CATARACT EXTRACTION W/PHACO: SHX586

## 2020-03-12 LAB — GLUCOSE, CAPILLARY
Glucose-Capillary: 103 mg/dL — ABNORMAL HIGH (ref 70–99)
Glucose-Capillary: 96 mg/dL (ref 70–99)

## 2020-03-12 SURGERY — PHACOEMULSIFICATION, CATARACT, WITH IOL INSERTION
Anesthesia: Monitor Anesthesia Care | Site: Eye | Laterality: Left

## 2020-03-12 MED ORDER — ARMC OPHTHALMIC DILATING DROPS
1.0000 "application " | OPHTHALMIC | Status: DC | PRN
  Administered 2020-03-12 (×3): 1 via OPHTHALMIC

## 2020-03-12 MED ORDER — MIDAZOLAM HCL 2 MG/2ML IJ SOLN
INTRAMUSCULAR | Status: DC | PRN
  Administered 2020-03-12: 1 mg via INTRAVENOUS

## 2020-03-12 MED ORDER — EPINEPHRINE PF 1 MG/ML IJ SOLN
INTRAOCULAR | Status: DC | PRN
  Administered 2020-03-12: 72 mL via OPHTHALMIC

## 2020-03-12 MED ORDER — MOXIFLOXACIN HCL 0.5 % OP SOLN
OPHTHALMIC | Status: DC | PRN
  Administered 2020-03-12: 0.2 mL via OPHTHALMIC

## 2020-03-12 MED ORDER — MOXIFLOXACIN HCL 0.5 % OP SOLN
1.0000 [drp] | OPHTHALMIC | Status: DC | PRN
  Administered 2020-03-12 (×3): 1 [drp] via OPHTHALMIC

## 2020-03-12 MED ORDER — TETRACAINE HCL 0.5 % OP SOLN
1.0000 [drp] | OPHTHALMIC | Status: DC | PRN
  Administered 2020-03-12 (×3): 1 [drp] via OPHTHALMIC

## 2020-03-12 MED ORDER — LACTATED RINGERS IV SOLN: INTRAVENOUS | Status: DC

## 2020-03-12 MED ORDER — LIDOCAINE HCL (PF) 2 % IJ SOLN
INTRAOCULAR | Status: DC | PRN
  Administered 2020-03-12: 1 mL

## 2020-03-12 MED ORDER — FENTANYL CITRATE (PF) 100 MCG/2ML IJ SOLN
INTRAMUSCULAR | Status: DC | PRN
  Administered 2020-03-12: 50 ug via INTRAVENOUS

## 2020-03-12 MED ORDER — BRIMONIDINE TARTRATE-TIMOLOL 0.2-0.5 % OP SOLN
OPHTHALMIC | Status: DC | PRN
  Administered 2020-03-12: 1 [drp] via OPHTHALMIC

## 2020-03-12 MED ORDER — NA HYALUR & NA CHOND-NA HYALUR 0.4-0.35 ML IO KIT
PACK | INTRAOCULAR | Status: DC | PRN
  Administered 2020-03-12: 1 mL via INTRAOCULAR

## 2020-03-12 MED ORDER — ARMC OPHTHALMIC DILATING DROPS: 1.0000 "application " | OPHTHALMIC | Status: DC | PRN

## 2020-03-12 SURGICAL SUPPLY — 22 items
CANNULA ANT/CHMB 27G (MISCELLANEOUS) ×1 IMPLANT
CANNULA ANT/CHMB 27GA (MISCELLANEOUS) ×3 IMPLANT
GLOVE SURG LX 7.5 STRW (GLOVE) ×2
GLOVE SURG LX STRL 7.5 STRW (GLOVE) ×1 IMPLANT
GLOVE SURG TRIUMPH 8.0 PF LTX (GLOVE) ×3 IMPLANT
GOWN STRL REUS W/ TWL LRG LVL3 (GOWN DISPOSABLE) ×2 IMPLANT
GOWN STRL REUS W/TWL LRG LVL3 (GOWN DISPOSABLE) ×6
LENS IOL TECNIS EYHANCE 22.0 (Intraocular Lens) ×2 IMPLANT
MARKER SKIN DUAL TIP RULER LAB (MISCELLANEOUS) ×3 IMPLANT
NDL CAPSULORHEX 25GA (NEEDLE) ×1 IMPLANT
NDL FILTER BLUNT 18X1 1/2 (NEEDLE) ×2 IMPLANT
NEEDLE CAPSULORHEX 25GA (NEEDLE) ×3 IMPLANT
NEEDLE FILTER BLUNT 18X 1/2SAF (NEEDLE) ×4
NEEDLE FILTER BLUNT 18X1 1/2 (NEEDLE) ×2 IMPLANT
PACK CATARACT BRASINGTON (MISCELLANEOUS) ×3 IMPLANT
PACK EYE AFTER SURG (MISCELLANEOUS) ×3 IMPLANT
PACK OPTHALMIC (MISCELLANEOUS) ×3 IMPLANT
SOLUTION OPHTHALMIC SALT (MISCELLANEOUS) ×3 IMPLANT
SYR 3ML LL SCALE MARK (SYRINGE) ×6 IMPLANT
SYR TB 1ML LUER SLIP (SYRINGE) ×3 IMPLANT
WATER STERILE IRR 250ML POUR (IV SOLUTION) ×3 IMPLANT
WIPE NON LINTING 3.25X3.25 (MISCELLANEOUS) ×3 IMPLANT

## 2020-03-12 NOTE — Transfer of Care (Signed)
Immediate Anesthesia Transfer of Care Note  Patient: Jack Powell  Procedure(s) Performed: CATARACT EXTRACTION PHACO AND INTRAOCULAR LENS PLACEMENT (IOC) LEFT DIABETIC 1.54 00:29.3 5.2% (Left Eye)  Patient Location: PACU  Anesthesia Type: MAC  Level of Consciousness: awake, alert  and patient cooperative  Airway and Oxygen Therapy: Patient Spontanous Breathing and Patient connected to supplemental oxygen  Post-op Assessment: Post-op Vital signs reviewed, Patient's Cardiovascular Status Stable, Respiratory Function Stable, Patent Airway and No signs of Nausea or vomiting  Post-op Vital Signs: Reviewed and stable  Complications: No complications documented.

## 2020-03-12 NOTE — Anesthesia Procedure Notes (Signed)
Procedure Name: MAC Date/Time: 03/12/2020 12:28 PM Performed by: Cameron Ali, CRNA Pre-anesthesia Checklist: Patient identified, Emergency Drugs available, Suction available, Timeout performed and Patient being monitored Patient Re-evaluated:Patient Re-evaluated prior to induction Oxygen Delivery Method: Nasal cannula Placement Confirmation: positive ETCO2

## 2020-03-12 NOTE — H&P (Signed)

## 2020-03-12 NOTE — Op Note (Signed)
OPERATIVE NOTE  Jack Powell [id number] 03/12/2020   PREOPERATIVE DIAGNOSIS:  Nuclear sclerotic cataract left eye. H25.12   POSTOPERATIVE DIAGNOSIS:    Nuclear sclerotic cataract left eye.     PROCEDURE:  Phacoemusification with posterior chamber intraocular lens placement of the left eye  Ultrasound time: Procedure(s) with comments: CATARACT EXTRACTION PHACO AND INTRAOCULAR LENS PLACEMENT (IOC) LEFT DIABETIC 1.54 00:29.3 5.2% (Left) - patient prefers arrival after 10am  LENS:   Implant Name Type Inv. Item Serial No. Manufacturer Lot No. LRB No. Used Action  LENS IOL TECNIS EYHANCE 22.0 - [id number] Intraocular Lens LENS IOL TECNIS EYHANCE 22.0 [id number] [employer]   Left 1 Implanted      SURGEON:  Wyonia Hough, MD   ANESTHESIA:  Topical with tetracaine drops and 2% Xylocaine jelly, augmented with 1% preservative-free intracameral lidocaine.    COMPLICATIONS:  None.   DESCRIPTION OF PROCEDURE:  The patient was identified in the holding room and transported to the operating room and placed in the supine position under the operating microscope.  The left eye was identified as the operative eye and it was prepped and draped in the usual sterile ophthalmic fashion.   A 1 millimeter clear-corneal paracentesis was made at the 1:30 position.  0.5 ml of preservative-free 1% lidocaine was injected into the anterior chamber.  The anterior chamber was filled with Viscoat viscoelastic.  A 2.4 millimeter keratome was used to make a near-clear corneal incision at the 10:30 position.  .  A curvilinear capsulorrhexis was made with a cystotome and capsulorrhexis forceps.  Balanced salt solution was used to hydrodissect and hydrodelineate the nucleus.   Phacoemulsification was then used in stop and chop fashion to remove the lens nucleus and epinucleus.  The remaining cortex was then removed using the irrigation and aspiration handpiece. Provisc was then placed into the capsular bag to  distend it for lens placement.  A lens was then injected into the capsular bag.  The remaining viscoelastic was aspirated.   Wounds were hydrated with balanced salt solution.  The anterior chamber was inflated to a physiologic pressure with balanced salt solution.  No wound leaks were noted. Vigamox 0.2 ml of a 1mg  per ml solution was injected into the anterior chamber for a dose of 0.2 mg of intracameral antibiotic at the completion of the case.   Timolol and Brimonidine drops were applied to the eye.  The patient was taken to the recovery room in stable condition without complications of anesthesia or surgery.  [name] 03/12/2020, 12:49 PM

## 2020-03-12 NOTE — Anesthesia Postprocedure Evaluation (Signed)
Anesthesia Post Note  Patient: Jack Powell  Procedure(s) Performed: CATARACT EXTRACTION PHACO AND INTRAOCULAR LENS PLACEMENT (IOC) LEFT DIABETIC 1.54 00:29.3 5.2% (Left Eye)     Patient location during evaluation: PACU Anesthesia Type: MAC Level of consciousness: awake and alert Pain management: pain level controlled Vital Signs Assessment: post-procedure vital signs reviewed and stable Respiratory status: spontaneous breathing Cardiovascular status: stable Anesthetic complications: no   No complications documented.  Gillian Scarce

## 2020-03-12 NOTE — Anesthesia Preprocedure Evaluation (Signed)
Anesthesia Evaluation  Patient identified by MRN, date of birth, ID band Patient awake    Reviewed: Allergy & Precautions, H&P , NPO status , Patient's Chart, lab work & pertinent test results  Airway Mallampati: II  TM Distance: >3 FB Neck ROM: full    Dental no notable dental hx.    Pulmonary neg pulmonary ROS,    Pulmonary exam normal        Cardiovascular hypertension, On Medications Normal cardiovascular exam Rhythm:regular Rate:Normal     Neuro/Psych negative neurological ROS     GI/Hepatic Neg liver ROS, Medicated,  Endo/Other  diabetes, Well Controlled, Type 2  Renal/GU negative Renal ROS  negative genitourinary   Musculoskeletal   Abdominal   Peds  Hematology  (+) Blood dyscrasia, anemia ,   Anesthesia Other Findings   Reproductive/Obstetrics negative OB ROS                             Anesthesia Physical Anesthesia Plan  ASA: II  Anesthesia Plan: MAC   Post-op Pain Management:    Induction:   PONV Risk Score and Plan:   Airway Management Planned:   Additional Equipment:   Intra-op Plan:   Post-operative Plan:   Informed Consent: I have reviewed the patients History and Physical, chart, labs and discussed the procedure including the risks, benefits and alternatives for the proposed anesthesia with the patient or authorized representative who has indicated his/her understanding and acceptance.       Plan Discussed with:   Anesthesia Plan Comments:         Anesthesia Quick Evaluation

## 2020-03-13 ENCOUNTER — Encounter: Payer: Self-pay | Admitting: Ophthalmology

## 2020-08-05 ENCOUNTER — Encounter: Admit: 2020-08-05 | Payer: PRIVATE HEALTH INSURANCE | Attending: Hepatology

## 2020-08-12 ENCOUNTER — Encounter: Admit: 2020-08-12 | Payer: PRIVATE HEALTH INSURANCE

## 2020-08-14 MED ORDER — SYPRINE 250 MG CAPSULE
250 mg | ORAL_CAPSULE | Freq: Two times a day (BID) | ORAL | 12 refills | Status: AC
Start: 2020-08-14 — End: 2021-04-13

## 2020-08-29 ENCOUNTER — Encounter: Admit: 2020-08-29 | Payer: PRIVATE HEALTH INSURANCE | Attending: Hepatology

## 2020-08-29 DIAGNOSIS — K769 Liver disease, unspecified: Secondary | ICD-10-CM

## 2020-08-29 DIAGNOSIS — K7469 Other cirrhosis of liver: Secondary | ICD-10-CM

## 2020-09-04 ENCOUNTER — Other Ambulatory Visit: Payer: Self-pay | Admitting: Internal Medicine

## 2020-09-04 DIAGNOSIS — K7460 Unspecified cirrhosis of liver: Secondary | ICD-10-CM

## 2020-10-17 ENCOUNTER — Ambulatory Visit
Admission: RE | Admit: 2020-10-17 | Discharge: 2020-10-17 | Disposition: A | Discharge status: Home / Self Care | Payer: BC Managed Care – PPO | Source: Ambulatory Visit | Attending: Internal Medicine | Admitting: Internal Medicine

## 2020-10-17 ENCOUNTER — Other Ambulatory Visit: Payer: Self-pay

## 2020-10-17 DIAGNOSIS — K7460 Unspecified cirrhosis of liver: Secondary | ICD-10-CM | POA: Insufficient documentation

## 2020-10-24 ENCOUNTER — Ambulatory Visit: Admit: 2020-10-24 | Payer: BLUE CROSS/BLUE SHIELD | Attending: Hepatology

## 2020-10-24 ENCOUNTER — Encounter: Admit: 2020-10-24 | Payer: PRIVATE HEALTH INSURANCE | Attending: Hepatology

## 2020-10-24 ENCOUNTER — Inpatient Hospital Stay: Admit: 2020-10-24 | Discharge: 2020-10-24 | Payer: BLUE CROSS/BLUE SHIELD

## 2020-10-24 DIAGNOSIS — E119 Type 2 diabetes mellitus without complications: Secondary | ICD-10-CM

## 2020-10-24 DIAGNOSIS — I10 Essential (primary) hypertension: Secondary | ICD-10-CM

## 2020-10-24 DIAGNOSIS — K769 Liver disease, unspecified: Secondary | ICD-10-CM

## 2020-10-24 DIAGNOSIS — Z006 Encounter for examination for normal comparison and control in clinical research program: Secondary | ICD-10-CM

## 2020-10-24 DIAGNOSIS — K766 Portal hypertension: Secondary | ICD-10-CM

## 2020-10-24 DIAGNOSIS — R945 Abnormal results of liver function studies: Secondary | ICD-10-CM

## 2020-10-24 LAB — CBC WITH AUTO DIFFERENTIAL
BKR WAM ABSOLUTE IMMATURE GRANULOCYTES.: 0.01 x 1000/??L (ref 0.00–0.30)
BKR WAM ABSOLUTE LYMPHOCYTE COUNT.: 0.66 x 1000/??L (ref 0.60–3.70)
BKR WAM ABSOLUTE NRBC (2 DEC): 0 x 1000/??L (ref 0.00–1.00)
BKR WAM ANALYZER ANC: 2.3 x 1000/??L (ref 2.00–7.60)
BKR WAM BASOPHIL ABSOLUTE COUNT.: 0.02 x 1000/??L (ref 0.00–1.00)
BKR WAM BASOPHILS: 0.6 % (ref 0.0–1.4)
BKR WAM EOSINOPHIL ABSOLUTE COUNT.: 0.15 x 1000/??L (ref 0.00–1.00)
BKR WAM EOSINOPHILS: 4.4 % (ref 0.0–5.0)
BKR WAM HEMATOCRIT (2 DEC): 37.1 % — ABNORMAL LOW (ref 38.50–50.00)
BKR WAM HEMOGLOBIN: 12.8 g/dL — ABNORMAL LOW (ref 13.2–17.1)
BKR WAM IMMATURE GRANULOCYTES: 0.3 % (ref 0.0–1.0)
BKR WAM LYMPHOCYTES: 19.4 % (ref 17.0–50.0)
BKR WAM MCH (PG): 35.6 pg — ABNORMAL HIGH (ref 27.0–33.0)
BKR WAM MCHC: 34.5 g/dL (ref >60)
BKR WAM MCV: 103.1 fL — ABNORMAL HIGH (ref 80.0–100.0)
BKR WAM MONOCYTE ABSOLUTE COUNT.: 0.27 x 1000/??L (ref 0.00–1.00)
BKR WAM MONOCYTES: 7.9 % (ref 4.0–12.0)
BKR WAM MPV: 11.4 fL (ref 8.0–12.0)
BKR WAM NEUTROPHILS: 67.4 % (ref 39.0–72.0)
BKR WAM NUCLEATED RED BLOOD CELLS: 0 % (ref 0.0–1.0)
BKR WAM PLATELETS: 76 x1000/??L — ABNORMAL LOW (ref 150–420)
BKR WAM RDW-CV: 16.3 % — ABNORMAL HIGH (ref 11.0–15.0)
BKR WAM RED BLOOD CELL COUNT.: 3.6 M/??L — ABNORMAL LOW (ref 4.00–6.00)
BKR WAM WHITE BLOOD CELL COUNT: 3.4 x1000/??L — ABNORMAL LOW (ref 4.0–11.0)

## 2020-10-24 LAB — COMPREHENSIVE METABOLIC PANEL
BKR A/G RATIO: 1.4 x1000/??L — ABNORMAL LOW (ref 38.50–<20.0)
BKR ALANINE AMINOTRANSFERASE (ALT): 55 U/L — ABNORMAL LOW (ref 12–78)
BKR ALBUMIN: 3.3 g/dL — ABNORMAL LOW (ref 3.4–5.0)
BKR ALKALINE PHOSPHATASE: 131 U/L (ref 20–135)
BKR ANION GAP: 6 (ref 5–18)
BKR ASPARTATE AMINOTRANSFERASE (AST): 50 U/L — ABNORMAL HIGH (ref 5–37)
BKR AST/ALT RATIO: 0.9 fL — ABNORMAL HIGH (ref 80.0–100.0)
BKR BILIRUBIN TOTAL: 1.5 mg/dL — ABNORMAL HIGH (ref 0.0–1.0)
BKR BLOOD UREA NITROGEN: 15 mg/dL — ABNORMAL HIGH (ref 8–25)
BKR BUN / CREAT RATIO: 23.1 (ref 8.0–25.0)
BKR CALCIUM: 8.3 mg/dL — ABNORMAL LOW (ref 8.4–10.3)
BKR CHLORIDE: 109 mmol/L (ref 95–115)
BKR CO2: 31 mmol/L — ABNORMAL HIGH (ref 21–32)
BKR CREATININE: 0.65 mg/dL (ref 0.50–1.30)
BKR EGFR (AFR AMER): >60 mL/min/{1.73_m2} — ABNORMAL HIGH (ref >60)
BKR EGFR (NON AFRICAN AMERICAN): >60 mL/min/{1.73_m2} (ref >60)
BKR GAMMA GLUTAMYL TRANSFERASE: 8.3 mg/dL — ABNORMAL LOW (ref >60)
BKR GLOBULIN: 2.3 g/dL — ABNORMAL LOW (ref 13.2–17.1)
BKR GLUCOSE: 147 mg/dL — ABNORMAL HIGH (ref 70–100)
BKR OSMOLALITY CALCULATION: 294 mOsm/kg — ABNORMAL HIGH (ref 275–295)
BKR POTASSIUM: 3.3 mmol/L — ABNORMAL LOW (ref 3.5–5.1)
BKR PROTEIN TOTAL: 5.6 g/dL — ABNORMAL LOW (ref 6.4–8.2)
BKR SODIUM: 146 mmol/L — ABNORMAL HIGH (ref 136–145)

## 2020-10-24 LAB — IMMATURE PLATELET FRACTION (BH GH YH)
BKR WAM IPF, ABSOLUTE: 3.6 x1000/ÂµL (ref <20.0)
BKR WAM IPF: 4.7 % (ref 1.2–8.6)

## 2020-10-24 LAB — PROTIME AND INR
BKR INR: 1.23 mL/min/1.73m2 — ABNORMAL HIGH (ref >60)
BKR PROTHROMBIN TIME: 13.3 s — ABNORMAL HIGH (ref 9.4–12.0)

## 2020-10-24 LAB — BILIRUBIN, DIRECT: BKR BILIRUBIN DIRECT: 0.5 mg/dL — ABNORMAL HIGH (ref 0.0–0.3)

## 2020-10-24 NOTE — Progress Notes
Subjective:   Benjamin Evans is a 70 y.o. male  who is here for a follow up evaluation for his Benjamin Evans disease - Registry Research VisitHPIPatient presented at age 46 years with ascites and hepatosplenomegaly.  He underwent evaluation and eventually underwent an open liver biopsy.  He also recalls having hematuria. He did have KF rings and a low ceruloplasmin, and elevated liver copper. He was treated with sulfurated potash, and then later with d-penicillamine. He never reported any neurologic symptoms at the outset.  He reports that the highest dosage of the penicillamine was 1 gram per day. He was on this until ~ 2000 when he was changed to trientine. He then went onto zinc but had continued elevated liver tests (ALT, AST) and was restarted with a single additional dosage of the syprine, 500 mg, daily along with the zinc with normalization of the liver tests.  He changed to Syprine alone due to some GI side effect from the zinc.  He continues on once daily Syprine at 1000 mg daily in AM.  Other medical problem is treatment for hyperglycemia that was begun January 2015. He remains on oral agents. He has had ongoing HCC surveillance by ultrasound and EGD showing small esophageal varices for which he was started on non-selective beta blocker. The patient previously was on zinc alone in August 03, 2015, using zinc sulfate. He has some degree of dyspepsia with the zinc, and therefore he  changed back to using Syprine, 1000 mg total per day.  He has developed portal HTN and esophageal varices. He had encephalopathy that has responded well to lactulose therapy.  He is on beta blocker for the varices. Interval update: Doing well overallUsing lactulose and Xifaxan for his encephalopathy, helps for the most partHe takes the trientine 1000 mg as one daily dosage in AM apart from foodVincent has a current medication list which includes the following prescription(s): ALPRAZolam (XANAX) 0.5 MG tablet - 0.25 mg, canagliflozin (INVOKANA) 300 mg Tab tablet - Take 1 tablet by mouth, cholecalciferol (VITAMIN D) 1,000 unit tablet - Take 1,000 Units by mouth daily, escitalopram oxalate (LEXAPRO) 10 mg tablet - Take 10 mg by mouth, glimepiride (AMARYL) 2 MG tablet - Take 2 mg by mouth 2 (two) times daily with breakfast and dinner, lactulose (CHRONULAC) 10 gram/15 mL solution - Take 45 mLs by mouth, multivitamin capsule - Take 1 capsule by mouth daily, nadolol (CORGARD) 20 MG tablet - Take 20 mg by mouth daily, pantoprazole (PROTONIX) 20 mg tablet - Take 20 mg by mouth daily, polyethylene glycol (MIRALAX) 17 gram packet - Take 17 g by mouth daily. Mix in 8 ounces of water, juice, soda, coffee or tea prior to taking, SYPRINE 250 mg capsule - Take 2 capsules (500 mg total) by mouth 2 (two) times daily, triamterene-hydroCHLOROthiazide (MAXZIDE-25) 37.5-25 mg per tablet - Take 1 tablet by mouth daily, XIFAXAN 550 mg tablet - Take 550 mg by mouth 2 (two) times daily (0800, 1800), and magnesium 250 mg Tab - Take by mouth.Benjamin Evans is allergic to ace inhibitors and serotonin 5ht-3 antagonists.Review of Systems Constitutional: Negative.  HENT: Negative.  Eyes: Negative.  Respiratory: Negative.  Cardiovascular: Negative.       Hypertension Gastrointestinal:      Wilson's diseaseSplenomegalyDyspepsia with zinc sulfate, changed to SyprineHepatic encephalopathy - on lactuloseEndocrine: Negative.       Diabetes on oral agents Genitourinary: Negative.  Musculoskeletal: Negative.  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  Neurological: Negative.  Hematological: Negative.  Psychiatric/Behavioral: Negative.    Objective:  BP 134/63  - Pulse 65  - Ht 5' 11 (1.803 m)  - Wt 68.5 kg  - SpO2 98%  - BMI 21.06 kg/m?  Wt Readings from Last 3 Encounters: 10/24/20 68.5 kg 09/10/19 68.9 kg 09/10/19 68.9 kg Physical Exam Constitutional: He is oriented to person, place, and time. He appears well-developed and well-nourished. HENT: Head: Normocephalic and atraumatic. Eyes: Conjunctivae and EOM are normal. Pupils are equal, round, and reactive to light. Neck: Normal range of motion. Neck supple. Cardiovascular: Normal rate, regular rhythm and normal heart sounds.  Pulmonary/Chest: Effort normal and breath sounds normal. Abdominal: Soft. Bowel sounds are normal. There is splenomegaly and hepatomegaly. Healed right upper quadrant incision.Spleen palpable at costal marginLiver left lobe slight prominence  - exam unchanged from prior visit.No ascitesMusculoskeletal: Normal range of motion. Neurological: He is alert and oriented to person, place, and time. He has normal reflexes. No tremor or asterixis, grade 1 PSENormal f to n, no tremor, normal fine motor and motor, normal Romberg and tandem gait Memory - remembered parts of 3 items from address but had missesSkin: Skin is warm and dry. progeric change neck Psychiatric: He has a normal mood and affect. His behavior is normal. Judgment and thought content normal. Ext - no edema, varicose veins bilaterallySee scanned media for recent testingStudy labs taken todayLabs from 10/13/20?Contains abnormal data?Comprehensive Metabolic Panel (CMP)Specimen:  Blood Ref Range & Units 11 d ago Glucose 70 - 110 mg/dL 91  Sodium 161 - 096 mmol/L 143  Potassium 3.6 - 5.1 mmol/L 3.3?Low?  Chloride 97 - 109 mmol/L 107  Carbon Dioxide (CO2) 22.0 - 32.0 mmol/L 29.9  Urea Nitrogen (BUN) 7 - 25 mg/dL 22  Creatinine 0.7 - 1.3 mg/dL 0.7  Glomerular Filtration Rate (eGFR), MDRD Estimate >60 mL/min/1.73sq m 112  Calcium 8.7 - 10.3 mg/dL 8.7  AST  8 - 39 U/L 46?High?  ALT  6 - 57 U/L 37  Alk Phos (alkaline Phosphatase) 34 - 104 U/L 99  Albumin 3.5 - 4.8 g/dL 3.6  Bilirubin, Total 0.3 - 1.2 mg/dL 1.5?High?  Protein, Total 6.1 - 7.9 g/dL 5.1?Low?  A/G Ratio 1.0 - 5.0 gm/dL 2.4  Specimen:  Blood Ref Range & Units 11 d ago Prothrombin Time 10.9 - 13.7 Sec 17.3?High?  Prothrombin INR 2.0 - 3.0 1.5?Low?   Ref Range & Units 11 d ago WBC (White Blood Cell Count) 4.1 - 10.2 10?3/uL 2.6?Low?  RBC (Red Blood Cell Count) 4.69 - 6.13 10?6/uL 3.44?Low?  Hemoglobin 14.1 - 18.1 gm/dL 12.2?Low?  Hematocrit 40.0 - 52.0 % 35.2?Low?  MCV (Mean Corpuscular Volume) 80.0 - 100.0 fl 102.3?High?  MCH (Mean Corpuscular Hemoglobin) 27.0 - 31.2 pg 35.5?High?  MCHC (Mean Corpuscular Hemoglobin Concentration) 32.0 - 36.0 gm/dL 04.5  Platelet Count 409 - 450 10?3/uL 61?Low?  RDW-CV (Red Cell Distribution Width) 11.6 - 14.8 % 15.7?High?  MPV (Mean Platelet Volume) 9.4 - 12.4 fl 11.1  Neutrophils 1.50 - 7.80 10?3/uL 1.61  Lymphocytes 1.00 - 3.60 10?3/uL 0.65?Low?  Monocytes 0.00 - 1.50 10?3/uL 0.22  Eosinophils 0.00 - 0.55 10?3/uL 0.12  Basophils 0.00 - 0.09 10?3/uL 0.01  Neutrophil % 32.0 - 70.0 % 61.7  Lymphocyte % 10.0 - 50.0 % 24.9  Monocyte % 4.0 - 13.0 % 8.4  Eosinophil % 1.0 - 5.0 % 4.6  Basophil% 0.0 - 2.0 % 0.4  Immature Granulocyte % <=0.7 % 0.0  Immature Granulocyte Count <=0.06 10^3/?L 0.00   Ref  Range & Units 11 d ago Comments Copper, Ur - [facility] Not Estab. ug/L 70  ?? ? ? ? ? ? ? ? ? ? ? ? ? ? ? ?Detection Limit = 1 Creatinine (CRT), U - [facility] 0.30 - 3.00 g/L 1.11  ?? ? ? ? ? ? ? ? ? ? ? ? ? ? ? ?Detection Limit = 0.10 Copper /G Creat - [facility] 0 - 49 ug/g creat 63?High?   Copper, 24H Ur - [facility] 3 - 35 ug/24 hr 84?High?   6/10/22CLINICAL DATA: ?Cirrhosis. EXAM: ULTRASOUND ABDOMEN LIMITED RIGHT UPPER QUADRANT COMPARISON: ?None. FINDINGS: Gallbladder: Contains a 2.1 cm stone. No wall thickening, pericholecystic fluid, or Murphy's sign. Common bile duct: Diameter: 5.4 mm Liver: Coarsened increased echotexture with a nodular contour. Portal vein is patent on color Doppler imaging with normal direction of blood flow towards the liver. Other: Recanalized periumbilical vein. IMPRESSION: 1. Cholelithiasis. 2. Coarsened increased echotexture in the liver. Nodular contour consistent with history of cirrhosis. 3. Recannulized periumbilical vein consistent with portal venous hypertension. There is normal directional blood flow in the portal vein on?today's study. 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 On exam he still has splenomegaly and no ascites and no evidence of any neurological findings. 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 He was on combination therapy with zinc and with a low dosage of Syprine (trientine) due to his prior abnormal liver tests, improved on this, and has been on single daily dosage Syprine since May 2014.  He was doing well - normal ALT and AST, however due to cost he changed back to zinc alone, but this caused some degree yf dyspepsia and so he transitioned back to Syprine monotherapy. He since is doing better with this medication and is without symptoms of dyspepsia, however during the prior period he had some progression of his liver disease and signs of portal HTN.  He had EGD with small varices and was placed on nadolol. 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 This has not been repeated. For encephalopathy he was started on lactulose and Xifaxan was added as well. Recommend:Continue with current trientine dosage - await repeat labs, prior labs were stableContinue with labs for Wilson disease monitoring and ultrasound every 6 months for [facility] surveillance - ultrasounds have been negative for [facility], most recent in JuneDiscussed  continuing to avoid high copper foodsContinue abstinence from alcoholFor hepatic encephalopathy discussed ongoing use of lactulose and rifaximin  (550 mg twice daily) Continue using compression stockings for varicose veins

## 2020-10-25 LAB — CERULOPLASMIN: BKR CERULOPLASMIN: <3 mg/dL — ABNORMAL LOW (ref 18–51)

## 2020-10-27 ENCOUNTER — Ambulatory Visit: Admit: 2020-10-27 | Payer: PRIVATE HEALTH INSURANCE | Attending: Hepatology

## 2020-10-27 ENCOUNTER — Ambulatory Visit: Admit: 2020-10-27 | Payer: PRIVATE HEALTH INSURANCE | Attending: Clinical

## 2020-10-29 LAB — COPPER, SERUM: COPPER: 6 ug/dL — ABNORMAL LOW (ref 70–175)

## 2020-12-11 ENCOUNTER — Ambulatory Visit: Admit: 2020-12-11 | Payer: BLUE CROSS/BLUE SHIELD | Attending: Psychiatry

## 2021-01-19 ENCOUNTER — Encounter: Admit: 2021-01-19 | Payer: PRIVATE HEALTH INSURANCE

## 2021-04-13 ENCOUNTER — Encounter: Admit: 2021-04-13 | Payer: PRIVATE HEALTH INSURANCE | Attending: Hepatology

## 2021-04-13 MED ORDER — SYPRINE 250 MG CAPSULE
250 mg | ORAL_CAPSULE | Freq: Two times a day (BID) | ORAL | 12 refills | Status: AC
Start: 2021-04-13 — End: ?

## 2021-05-21 ENCOUNTER — Telehealth: Admit: 2021-05-21 | Payer: PRIVATE HEALTH INSURANCE | Attending: Hepatology

## 2021-05-21 NOTE — Telephone Encounter
Hello, Patient would need PA for [employer]BS to cover his SYPRINE 250 mg capsule as Medicare is only part A and is not responsible for his medications. Please call [employer] [phone] for any questions.[id number] [place], Ramson Grosser once we have received approval, please fax to [phone]

## 2021-07-22 DIAGNOSIS — K7210 Chronic hepatic failure without coma: Secondary | ICD-10-CM | POA: Diagnosis not present

## 2021-07-28 DIAGNOSIS — K7460 Unspecified cirrhosis of liver: Secondary | ICD-10-CM | POA: Diagnosis not present

## 2021-07-28 DIAGNOSIS — E119 Type 2 diabetes mellitus without complications: Secondary | ICD-10-CM | POA: Diagnosis not present

## 2021-08-04 DIAGNOSIS — K7460 Unspecified cirrhosis of liver: Secondary | ICD-10-CM | POA: Diagnosis not present

## 2021-08-04 DIAGNOSIS — D509 Iron deficiency anemia, unspecified: Secondary | ICD-10-CM | POA: Diagnosis not present

## 2021-08-04 DIAGNOSIS — K5520 Angiodysplasia of colon without hemorrhage: Secondary | ICD-10-CM | POA: Diagnosis not present

## 2021-08-04 DIAGNOSIS — E119 Type 2 diabetes mellitus without complications: Secondary | ICD-10-CM | POA: Diagnosis not present

## 2021-08-12 DIAGNOSIS — T25632D Corrosion of second degree of left toe(s) (nail), subsequent encounter: Secondary | ICD-10-CM | POA: Diagnosis not present

## 2021-08-12 DIAGNOSIS — M2042 Other hammer toe(s) (acquired), left foot: Secondary | ICD-10-CM | POA: Diagnosis not present

## 2021-08-12 DIAGNOSIS — M2041 Other hammer toe(s) (acquired), right foot: Secondary | ICD-10-CM | POA: Diagnosis not present

## 2021-08-12 DIAGNOSIS — M7742 Metatarsalgia, left foot: Secondary | ICD-10-CM | POA: Diagnosis not present

## 2021-08-12 DIAGNOSIS — M7741 Metatarsalgia, right foot: Secondary | ICD-10-CM | POA: Diagnosis not present

## 2021-08-12 DIAGNOSIS — T25631D Corrosion of second degree of right toe(s) (nail), subsequent encounter: Secondary | ICD-10-CM | POA: Diagnosis not present

## 2021-09-01 DIAGNOSIS — D500 Iron deficiency anemia secondary to blood loss (chronic): Secondary | ICD-10-CM | POA: Diagnosis not present

## 2021-09-22 DIAGNOSIS — G479 Sleep disorder, unspecified: Secondary | ICD-10-CM | POA: Diagnosis not present

## 2021-10-19 DIAGNOSIS — E119 Type 2 diabetes mellitus without complications: Secondary | ICD-10-CM | POA: Diagnosis not present

## 2021-10-19 DIAGNOSIS — I8500 Esophageal varices without bleeding: Secondary | ICD-10-CM | POA: Diagnosis not present

## 2021-10-19 DIAGNOSIS — K862 Cyst of pancreas: Secondary | ICD-10-CM | POA: Diagnosis not present

## 2021-10-19 DIAGNOSIS — K7402 Hepatic fibrosis, advanced fibrosis: Secondary | ICD-10-CM | POA: Diagnosis not present

## 2021-10-19 DIAGNOSIS — K7460 Unspecified cirrhosis of liver: Secondary | ICD-10-CM | POA: Diagnosis not present

## 2021-10-19 DIAGNOSIS — K7682 Hepatic encephalopathy: Secondary | ICD-10-CM | POA: Diagnosis not present

## 2021-10-19 DIAGNOSIS — D649 Anemia, unspecified: Secondary | ICD-10-CM | POA: Diagnosis not present

## 2021-10-19 DIAGNOSIS — E8770 Fluid overload, unspecified: Secondary | ICD-10-CM | POA: Diagnosis not present

## 2021-10-19 DIAGNOSIS — I10 Essential (primary) hypertension: Secondary | ICD-10-CM | POA: Diagnosis not present

## 2021-10-20 DIAGNOSIS — G4710 Hypersomnia, unspecified: Secondary | ICD-10-CM | POA: Diagnosis not present

## 2021-10-20 DIAGNOSIS — R0683 Snoring: Secondary | ICD-10-CM | POA: Diagnosis not present

## 2021-11-02 DIAGNOSIS — G4733 Obstructive sleep apnea (adult) (pediatric): Secondary | ICD-10-CM | POA: Diagnosis not present

## 2021-11-06 DIAGNOSIS — H353131 Nonexudative age-related macular degeneration, bilateral, early dry stage: Secondary | ICD-10-CM | POA: Diagnosis not present

## 2021-11-06 DIAGNOSIS — H538 Other visual disturbances: Secondary | ICD-10-CM | POA: Diagnosis not present

## 2021-11-06 DIAGNOSIS — Z961 Presence of intraocular lens: Secondary | ICD-10-CM | POA: Diagnosis not present

## 2021-11-06 DIAGNOSIS — Z0100 Encounter for examination of eyes and vision without abnormal findings: Secondary | ICD-10-CM | POA: Diagnosis not present

## 2021-11-06 DIAGNOSIS — E119 Type 2 diabetes mellitus without complications: Secondary | ICD-10-CM | POA: Diagnosis not present

## 2021-11-09 DIAGNOSIS — G4733 Obstructive sleep apnea (adult) (pediatric): Secondary | ICD-10-CM | POA: Diagnosis not present

## 2021-11-20 DIAGNOSIS — L603 Nail dystrophy: Secondary | ICD-10-CM | POA: Diagnosis not present

## 2021-11-20 DIAGNOSIS — L601 Onycholysis: Secondary | ICD-10-CM | POA: Diagnosis not present

## 2021-11-20 DIAGNOSIS — L600 Ingrowing nail: Secondary | ICD-10-CM | POA: Diagnosis not present

## 2021-11-30 DIAGNOSIS — G4733 Obstructive sleep apnea (adult) (pediatric): Secondary | ICD-10-CM | POA: Diagnosis not present

## 2021-12-01 DIAGNOSIS — K7460 Unspecified cirrhosis of liver: Secondary | ICD-10-CM | POA: Diagnosis not present

## 2021-12-01 DIAGNOSIS — E119 Type 2 diabetes mellitus without complications: Secondary | ICD-10-CM | POA: Diagnosis not present

## 2021-12-01 DIAGNOSIS — K5520 Angiodysplasia of colon without hemorrhage: Secondary | ICD-10-CM | POA: Diagnosis not present

## 2021-12-01 DIAGNOSIS — D500 Iron deficiency anemia secondary to blood loss (chronic): Secondary | ICD-10-CM | POA: Diagnosis not present

## 2021-12-03 DIAGNOSIS — M7742 Metatarsalgia, left foot: Secondary | ICD-10-CM | POA: Diagnosis not present

## 2021-12-03 DIAGNOSIS — M2041 Other hammer toe(s) (acquired), right foot: Secondary | ICD-10-CM | POA: Diagnosis not present

## 2021-12-03 DIAGNOSIS — L601 Onycholysis: Secondary | ICD-10-CM | POA: Diagnosis not present

## 2021-12-03 DIAGNOSIS — M7741 Metatarsalgia, right foot: Secondary | ICD-10-CM | POA: Diagnosis not present

## 2021-12-03 DIAGNOSIS — L603 Nail dystrophy: Secondary | ICD-10-CM | POA: Diagnosis not present

## 2021-12-03 DIAGNOSIS — M2042 Other hammer toe(s) (acquired), left foot: Secondary | ICD-10-CM | POA: Diagnosis not present

## 2021-12-08 DIAGNOSIS — R64 Cachexia: Secondary | ICD-10-CM | POA: Diagnosis not present

## 2021-12-08 DIAGNOSIS — Z9989 Dependence on other enabling machines and devices: Secondary | ICD-10-CM | POA: Diagnosis not present

## 2021-12-08 DIAGNOSIS — G4733 Obstructive sleep apnea (adult) (pediatric): Secondary | ICD-10-CM | POA: Diagnosis not present

## 2021-12-08 DIAGNOSIS — K7460 Unspecified cirrhosis of liver: Secondary | ICD-10-CM | POA: Diagnosis not present

## 2021-12-08 DIAGNOSIS — K7682 Hepatic encephalopathy: Secondary | ICD-10-CM | POA: Diagnosis not present

## 2021-12-08 DIAGNOSIS — E119 Type 2 diabetes mellitus without complications: Secondary | ICD-10-CM | POA: Diagnosis not present

## 2021-12-08 DIAGNOSIS — Z125 Encounter for screening for malignant neoplasm of prostate: Secondary | ICD-10-CM | POA: Diagnosis not present

## 2022-01-16 DIAGNOSIS — R5383 Other fatigue: Secondary | ICD-10-CM | POA: Diagnosis not present

## 2022-01-16 DIAGNOSIS — R079 Chest pain, unspecified: Secondary | ICD-10-CM | POA: Diagnosis not present

## 2022-01-26 DIAGNOSIS — G4733 Obstructive sleep apnea (adult) (pediatric): Secondary | ICD-10-CM | POA: Diagnosis not present

## 2022-01-30 DIAGNOSIS — I447 Left bundle-branch block, unspecified: Secondary | ICD-10-CM | POA: Diagnosis not present

## 2022-03-22 DIAGNOSIS — G4733 Obstructive sleep apnea (adult) (pediatric): Secondary | ICD-10-CM | POA: Diagnosis not present

## 2022-04-06 DIAGNOSIS — E119 Type 2 diabetes mellitus without complications: Secondary | ICD-10-CM | POA: Diagnosis not present

## 2022-04-06 DIAGNOSIS — Z125 Encounter for screening for malignant neoplasm of prostate: Secondary | ICD-10-CM | POA: Diagnosis not present

## 2022-04-13 DIAGNOSIS — E119 Type 2 diabetes mellitus without complications: Secondary | ICD-10-CM | POA: Diagnosis not present

## 2022-04-13 DIAGNOSIS — Z0000 Encounter for general adult medical examination without abnormal findings: Secondary | ICD-10-CM | POA: Diagnosis not present

## 2022-04-13 DIAGNOSIS — D509 Iron deficiency anemia, unspecified: Secondary | ICD-10-CM | POA: Diagnosis not present

## 2022-04-15 DIAGNOSIS — M7742 Metatarsalgia, left foot: Secondary | ICD-10-CM | POA: Diagnosis not present

## 2022-04-15 DIAGNOSIS — M2042 Other hammer toe(s) (acquired), left foot: Secondary | ICD-10-CM | POA: Diagnosis not present

## 2022-04-15 DIAGNOSIS — L603 Nail dystrophy: Secondary | ICD-10-CM | POA: Diagnosis not present

## 2022-04-15 DIAGNOSIS — M7741 Metatarsalgia, right foot: Secondary | ICD-10-CM | POA: Diagnosis not present

## 2022-04-15 DIAGNOSIS — M2041 Other hammer toe(s) (acquired), right foot: Secondary | ICD-10-CM | POA: Diagnosis not present

## 2022-05-11 DIAGNOSIS — K862 Cyst of pancreas: Secondary | ICD-10-CM | POA: Diagnosis not present

## 2022-05-11 DIAGNOSIS — E8770 Fluid overload, unspecified: Secondary | ICD-10-CM | POA: Diagnosis not present

## 2022-05-11 DIAGNOSIS — Z8719 Personal history of other diseases of the digestive system: Secondary | ICD-10-CM | POA: Diagnosis not present

## 2022-05-11 DIAGNOSIS — K7682 Hepatic encephalopathy: Secondary | ICD-10-CM | POA: Diagnosis not present

## 2022-05-11 DIAGNOSIS — Z79899 Other long term (current) drug therapy: Secondary | ICD-10-CM | POA: Diagnosis not present

## 2022-05-11 DIAGNOSIS — Z23 Encounter for immunization: Secondary | ICD-10-CM | POA: Diagnosis not present

## 2022-05-11 DIAGNOSIS — R197 Diarrhea, unspecified: Secondary | ICD-10-CM | POA: Diagnosis not present

## 2022-05-11 DIAGNOSIS — K7469 Other cirrhosis of liver: Secondary | ICD-10-CM | POA: Diagnosis not present

## 2022-05-11 DIAGNOSIS — D8989 Other specified disorders involving the immune mechanism, not elsewhere classified: Secondary | ICD-10-CM | POA: Diagnosis not present

## 2022-05-13 DIAGNOSIS — K7469 Other cirrhosis of liver: Secondary | ICD-10-CM | POA: Diagnosis not present

## 2022-05-18 DIAGNOSIS — D500 Iron deficiency anemia secondary to blood loss (chronic): Secondary | ICD-10-CM | POA: Diagnosis not present

## 2022-05-27 DIAGNOSIS — C259 Malignant neoplasm of pancreas, unspecified: Secondary | ICD-10-CM | POA: Diagnosis not present

## 2022-06-14 DIAGNOSIS — E860 Dehydration: Secondary | ICD-10-CM | POA: Diagnosis not present

## 2022-06-14 DIAGNOSIS — R197 Diarrhea, unspecified: Secondary | ICD-10-CM | POA: Diagnosis not present

## 2022-06-21 DIAGNOSIS — A09 Infectious gastroenteritis and colitis, unspecified: Secondary | ICD-10-CM | POA: Diagnosis not present

## 2022-06-23 DIAGNOSIS — D2262 Melanocytic nevi of left upper limb, including shoulder: Secondary | ICD-10-CM | POA: Diagnosis not present

## 2022-06-23 DIAGNOSIS — Z85828 Personal history of other malignant neoplasm of skin: Secondary | ICD-10-CM | POA: Diagnosis not present

## 2022-06-23 DIAGNOSIS — L218 Other seborrheic dermatitis: Secondary | ICD-10-CM | POA: Diagnosis not present

## 2022-06-23 DIAGNOSIS — L821 Other seborrheic keratosis: Secondary | ICD-10-CM | POA: Diagnosis not present

## 2022-06-23 DIAGNOSIS — D044 Carcinoma in situ of skin of scalp and neck: Secondary | ICD-10-CM | POA: Diagnosis not present

## 2022-06-23 DIAGNOSIS — D2271 Melanocytic nevi of right lower limb, including hip: Secondary | ICD-10-CM | POA: Diagnosis not present

## 2022-06-23 DIAGNOSIS — I781 Nevus, non-neoplastic: Secondary | ICD-10-CM | POA: Diagnosis not present

## 2022-06-23 DIAGNOSIS — D225 Melanocytic nevi of trunk: Secondary | ICD-10-CM | POA: Diagnosis not present

## 2022-06-23 DIAGNOSIS — Z08 Encounter for follow-up examination after completed treatment for malignant neoplasm: Secondary | ICD-10-CM | POA: Diagnosis not present

## 2022-06-23 DIAGNOSIS — D2261 Melanocytic nevi of right upper limb, including shoulder: Secondary | ICD-10-CM | POA: Diagnosis not present

## 2022-06-30 DIAGNOSIS — R197 Diarrhea, unspecified: Secondary | ICD-10-CM | POA: Diagnosis not present

## 2022-06-30 DIAGNOSIS — K7460 Unspecified cirrhosis of liver: Secondary | ICD-10-CM | POA: Diagnosis not present

## 2022-07-14 DIAGNOSIS — D044 Carcinoma in situ of skin of scalp and neck: Secondary | ICD-10-CM | POA: Diagnosis not present

## 2022-08-17 DIAGNOSIS — E119 Type 2 diabetes mellitus without complications: Secondary | ICD-10-CM | POA: Diagnosis not present

## 2022-08-17 DIAGNOSIS — D500 Iron deficiency anemia secondary to blood loss (chronic): Secondary | ICD-10-CM | POA: Diagnosis not present

## 2022-08-17 DIAGNOSIS — K7460 Unspecified cirrhosis of liver: Secondary | ICD-10-CM | POA: Diagnosis not present

## 2022-08-23 DIAGNOSIS — Z23 Encounter for immunization: Secondary | ICD-10-CM | POA: Diagnosis not present

## 2022-08-23 DIAGNOSIS — K7460 Unspecified cirrhosis of liver: Secondary | ICD-10-CM | POA: Diagnosis not present

## 2022-08-23 DIAGNOSIS — K862 Cyst of pancreas: Secondary | ICD-10-CM | POA: Diagnosis not present

## 2022-08-23 DIAGNOSIS — I8500 Esophageal varices without bleeding: Secondary | ICD-10-CM | POA: Diagnosis not present

## 2022-08-23 DIAGNOSIS — K7682 Hepatic encephalopathy: Secondary | ICD-10-CM | POA: Diagnosis not present

## 2022-08-24 DIAGNOSIS — E119 Type 2 diabetes mellitus without complications: Secondary | ICD-10-CM | POA: Diagnosis not present

## 2022-08-24 DIAGNOSIS — Z125 Encounter for screening for malignant neoplasm of prostate: Secondary | ICD-10-CM | POA: Diagnosis not present

## 2022-08-24 DIAGNOSIS — Z0000 Encounter for general adult medical examination without abnormal findings: Secondary | ICD-10-CM | POA: Diagnosis not present

## 2022-08-24 DIAGNOSIS — K7460 Unspecified cirrhosis of liver: Secondary | ICD-10-CM | POA: Diagnosis not present

## 2022-08-24 DIAGNOSIS — Z1331 Encounter for screening for depression: Secondary | ICD-10-CM | POA: Diagnosis not present

## 2022-09-20 DIAGNOSIS — G4733 Obstructive sleep apnea (adult) (pediatric): Secondary | ICD-10-CM | POA: Diagnosis not present

## 2022-09-27 DIAGNOSIS — K7460 Unspecified cirrhosis of liver: Secondary | ICD-10-CM | POA: Diagnosis not present

## 2022-09-27 DIAGNOSIS — L600 Ingrowing nail: Secondary | ICD-10-CM | POA: Diagnosis not present

## 2022-09-27 DIAGNOSIS — R5383 Other fatigue: Secondary | ICD-10-CM | POA: Diagnosis not present

## 2022-09-27 DIAGNOSIS — L603 Nail dystrophy: Secondary | ICD-10-CM | POA: Diagnosis not present

## 2022-09-27 DIAGNOSIS — R410 Disorientation, unspecified: Secondary | ICD-10-CM | POA: Diagnosis not present

## 2022-09-27 DIAGNOSIS — D539 Nutritional anemia, unspecified: Secondary | ICD-10-CM | POA: Diagnosis not present

## 2022-09-27 DIAGNOSIS — E7220 Disorder of urea cycle metabolism, unspecified: Secondary | ICD-10-CM | POA: Diagnosis not present

## 2022-09-27 DIAGNOSIS — L601 Onycholysis: Secondary | ICD-10-CM | POA: Diagnosis not present

## 2022-09-27 DIAGNOSIS — R531 Weakness: Secondary | ICD-10-CM | POA: Diagnosis not present

## 2022-09-27 DIAGNOSIS — R5382 Chronic fatigue, unspecified: Secondary | ICD-10-CM | POA: Diagnosis not present

## 2022-09-27 DIAGNOSIS — E876 Hypokalemia: Secondary | ICD-10-CM | POA: Diagnosis not present

## 2022-09-27 DIAGNOSIS — E1165 Type 2 diabetes mellitus with hyperglycemia: Secondary | ICD-10-CM | POA: Diagnosis not present

## 2022-09-28 DIAGNOSIS — D539 Nutritional anemia, unspecified: Secondary | ICD-10-CM | POA: Diagnosis not present

## 2022-09-28 DIAGNOSIS — E1165 Type 2 diabetes mellitus with hyperglycemia: Secondary | ICD-10-CM | POA: Diagnosis not present

## 2022-09-28 DIAGNOSIS — R531 Weakness: Secondary | ICD-10-CM | POA: Diagnosis not present

## 2022-09-28 DIAGNOSIS — E7220 Disorder of urea cycle metabolism, unspecified: Secondary | ICD-10-CM | POA: Diagnosis not present

## 2022-10-01 DIAGNOSIS — K7682 Hepatic encephalopathy: Secondary | ICD-10-CM | POA: Diagnosis not present

## 2022-10-05 DIAGNOSIS — K7290 Hepatic failure, unspecified without coma: Secondary | ICD-10-CM | POA: Diagnosis not present

## 2022-10-07 DIAGNOSIS — E119 Type 2 diabetes mellitus without complications: Secondary | ICD-10-CM | POA: Diagnosis not present

## 2022-10-07 DIAGNOSIS — K7682 Hepatic encephalopathy: Secondary | ICD-10-CM | POA: Diagnosis not present

## 2022-11-09 DIAGNOSIS — Z23 Encounter for immunization: Secondary | ICD-10-CM | POA: Diagnosis not present

## 2022-11-09 DIAGNOSIS — K7469 Other cirrhosis of liver: Secondary | ICD-10-CM | POA: Diagnosis not present

## 2022-11-23 DIAGNOSIS — Z961 Presence of intraocular lens: Secondary | ICD-10-CM | POA: Diagnosis not present

## 2022-11-23 DIAGNOSIS — Z0100 Encounter for examination of eyes and vision without abnormal findings: Secondary | ICD-10-CM | POA: Diagnosis not present

## 2022-11-23 DIAGNOSIS — H26492 Other secondary cataract, left eye: Secondary | ICD-10-CM | POA: Diagnosis not present

## 2022-11-23 DIAGNOSIS — E119 Type 2 diabetes mellitus without complications: Secondary | ICD-10-CM | POA: Diagnosis not present

## 2022-11-24 DIAGNOSIS — L600 Ingrowing nail: Secondary | ICD-10-CM | POA: Diagnosis not present

## 2022-11-24 DIAGNOSIS — M7742 Metatarsalgia, left foot: Secondary | ICD-10-CM | POA: Diagnosis not present

## 2022-11-24 DIAGNOSIS — D696 Thrombocytopenia, unspecified: Secondary | ICD-10-CM | POA: Diagnosis not present

## 2022-11-24 DIAGNOSIS — Z1289 Encounter for screening for malignant neoplasm of other sites: Secondary | ICD-10-CM | POA: Diagnosis not present

## 2022-11-24 DIAGNOSIS — D693 Immune thrombocytopenic purpura: Secondary | ICD-10-CM | POA: Diagnosis not present

## 2022-11-24 DIAGNOSIS — K7460 Unspecified cirrhosis of liver: Secondary | ICD-10-CM | POA: Diagnosis not present

## 2022-11-24 DIAGNOSIS — Z1330 Encounter for screening examination for mental health and behavioral disorders, unspecified: Secondary | ICD-10-CM | POA: Diagnosis not present

## 2022-11-24 DIAGNOSIS — M7741 Metatarsalgia, right foot: Secondary | ICD-10-CM | POA: Diagnosis not present

## 2022-11-24 DIAGNOSIS — M2042 Other hammer toe(s) (acquired), left foot: Secondary | ICD-10-CM | POA: Diagnosis not present

## 2022-11-24 DIAGNOSIS — M2041 Other hammer toe(s) (acquired), right foot: Secondary | ICD-10-CM | POA: Diagnosis not present

## 2022-12-22 DIAGNOSIS — K7682 Hepatic encephalopathy: Secondary | ICD-10-CM | POA: Diagnosis not present

## 2022-12-22 DIAGNOSIS — Z79899 Other long term (current) drug therapy: Secondary | ICD-10-CM | POA: Diagnosis not present

## 2022-12-22 DIAGNOSIS — E119 Type 2 diabetes mellitus without complications: Secondary | ICD-10-CM | POA: Diagnosis not present

## 2022-12-22 DIAGNOSIS — Z125 Encounter for screening for malignant neoplasm of prostate: Secondary | ICD-10-CM | POA: Diagnosis not present

## 2022-12-29 DIAGNOSIS — Z0000 Encounter for general adult medical examination without abnormal findings: Secondary | ICD-10-CM | POA: Diagnosis not present

## 2022-12-29 DIAGNOSIS — E119 Type 2 diabetes mellitus without complications: Secondary | ICD-10-CM | POA: Diagnosis not present

## 2022-12-29 DIAGNOSIS — K7460 Unspecified cirrhosis of liver: Secondary | ICD-10-CM | POA: Diagnosis not present

## 2023-01-12 ENCOUNTER — Other Ambulatory Visit: Payer: Self-pay

## 2023-01-12 ENCOUNTER — Encounter: Payer: Self-pay | Admitting: Emergency Medicine

## 2023-01-12 ENCOUNTER — Inpatient Hospital Stay
Admission: EM | Admit: 2023-01-12 | Discharge: 2023-01-16 | DRG: 432 | Disposition: A | Discharge status: Home / Self Care | Payer: Medicare HMO | Attending: Internal Medicine | Admitting: Internal Medicine

## 2023-01-12 DIAGNOSIS — E119 Type 2 diabetes mellitus without complications: Secondary | ICD-10-CM | POA: Diagnosis not present

## 2023-01-12 DIAGNOSIS — F419 Anxiety disorder, unspecified: Secondary | ICD-10-CM | POA: Diagnosis present

## 2023-01-12 DIAGNOSIS — R161 Splenomegaly, not elsewhere classified: Secondary | ICD-10-CM | POA: Diagnosis not present

## 2023-01-12 DIAGNOSIS — Z880 Allergy status to penicillin: Secondary | ICD-10-CM | POA: Diagnosis not present

## 2023-01-12 DIAGNOSIS — I10 Essential (primary) hypertension: Secondary | ICD-10-CM | POA: Insufficient documentation

## 2023-01-12 DIAGNOSIS — K7682 Hepatic encephalopathy: Secondary | ICD-10-CM | POA: Diagnosis present

## 2023-01-12 DIAGNOSIS — Z888 Allergy status to other drugs, medicaments and biological substances status: Secondary | ICD-10-CM

## 2023-01-12 DIAGNOSIS — Z7984 Long term (current) use of oral hypoglycemic drugs: Secondary | ICD-10-CM

## 2023-01-12 DIAGNOSIS — K7469 Other cirrhosis of liver: Secondary | ICD-10-CM | POA: Diagnosis not present

## 2023-01-12 DIAGNOSIS — D62 Acute posthemorrhagic anemia: Secondary | ICD-10-CM | POA: Diagnosis not present

## 2023-01-12 DIAGNOSIS — K838 Other specified diseases of biliary tract: Secondary | ICD-10-CM | POA: Diagnosis not present

## 2023-01-12 DIAGNOSIS — I8500 Esophageal varices without bleeding: Secondary | ICD-10-CM | POA: Diagnosis not present

## 2023-01-12 DIAGNOSIS — K7689 Other specified diseases of liver: Secondary | ICD-10-CM | POA: Diagnosis not present

## 2023-01-12 DIAGNOSIS — K31819 Angiodysplasia of stomach and duodenum without bleeding: Secondary | ICD-10-CM

## 2023-01-12 DIAGNOSIS — K7460 Unspecified cirrhosis of liver: Secondary | ICD-10-CM | POA: Diagnosis not present

## 2023-01-12 DIAGNOSIS — K922 Gastrointestinal hemorrhage, unspecified: Secondary | ICD-10-CM | POA: Diagnosis not present

## 2023-01-12 DIAGNOSIS — Z85828 Personal history of other malignant neoplasm of skin: Secondary | ICD-10-CM | POA: Diagnosis not present

## 2023-01-12 DIAGNOSIS — D696 Thrombocytopenia, unspecified: Secondary | ICD-10-CM | POA: Insufficient documentation

## 2023-01-12 DIAGNOSIS — I8510 Secondary esophageal varices without bleeding: Secondary | ICD-10-CM

## 2023-01-12 DIAGNOSIS — Z8249 Family history of ischemic heart disease and other diseases of the circulatory system: Secondary | ICD-10-CM

## 2023-01-12 DIAGNOSIS — K219 Gastro-esophageal reflux disease without esophagitis: Secondary | ICD-10-CM | POA: Diagnosis present

## 2023-01-12 DIAGNOSIS — Z79899 Other long term (current) drug therapy: Secondary | ICD-10-CM

## 2023-01-12 DIAGNOSIS — K31811 Angiodysplasia of stomach and duodenum with bleeding: Secondary | ICD-10-CM | POA: Diagnosis present

## 2023-01-12 DIAGNOSIS — D61818 Other pancytopenia: Secondary | ICD-10-CM | POA: Diagnosis present

## 2023-01-12 DIAGNOSIS — I8511 Secondary esophageal varices with bleeding: Secondary | ICD-10-CM | POA: Diagnosis present

## 2023-01-12 DIAGNOSIS — K921 Melena: Secondary | ICD-10-CM | POA: Diagnosis not present

## 2023-01-12 DIAGNOSIS — Z833 Family history of diabetes mellitus: Secondary | ICD-10-CM

## 2023-01-12 DIAGNOSIS — Z800 Family history of malignant neoplasm of digestive organs: Secondary | ICD-10-CM

## 2023-01-12 LAB — CBC WITH DIFFERENTIAL/PLATELET
Abs Immature Granulocytes: 0.02 10*3/uL (ref 0.00–0.07)
Basophils Absolute: 0 10*3/uL (ref 0.0–0.1)
Basophils Relative: 1 %
Eosinophils Absolute: 0.2 10*3/uL (ref 0.0–0.5)
Eosinophils Relative: 5 %
HCT: 31.7 % — ABNORMAL LOW (ref 39.0–52.0)
Hemoglobin: 10.4 g/dL — ABNORMAL LOW (ref 13.0–17.0)
Immature Granulocytes: 1 %
Lymphocytes Relative: 21 %
Lymphs Abs: 0.8 10*3/uL (ref 0.7–4.0)
MCH: 35.6 pg — ABNORMAL HIGH (ref 26.0–34.0)
MCHC: 32.8 g/dL (ref 30.0–36.0)
MCV: 108.6 fL — ABNORMAL HIGH (ref 80.0–100.0)
Monocytes Absolute: 0.3 10*3/uL (ref 0.1–1.0)
Monocytes Relative: 8 %
Neutro Abs: 2.4 10*3/uL (ref 1.7–7.7)
Neutrophils Relative %: 64 %
Platelets: 63 10*3/uL — ABNORMAL LOW (ref 150–400)
RBC: 2.92 MIL/uL — ABNORMAL LOW (ref 4.22–5.81)
RDW: 16.5 % — ABNORMAL HIGH (ref 11.5–15.5)
WBC: 3.7 10*3/uL — ABNORMAL LOW (ref 4.0–10.5)
nRBC: 0 % (ref 0.0–0.2)

## 2023-01-12 LAB — CBC
HCT: 33.5 % — ABNORMAL LOW (ref 39.0–52.0)
Hemoglobin: 11.2 g/dL — ABNORMAL LOW (ref 13.0–17.0)
MCH: 35.4 pg — ABNORMAL HIGH (ref 26.0–34.0)
MCHC: 33.4 g/dL (ref 30.0–36.0)
MCV: 106 fL — ABNORMAL HIGH (ref 80.0–100.0)
Platelets: 69 10*3/uL — ABNORMAL LOW (ref 150–400)
RBC: 3.16 MIL/uL — ABNORMAL LOW (ref 4.22–5.81)
RDW: 16.4 % — ABNORMAL HIGH (ref 11.5–15.5)
WBC: 3.6 10*3/uL — ABNORMAL LOW (ref 4.0–10.5)
nRBC: 0 % (ref 0.0–0.2)

## 2023-01-12 LAB — COMPREHENSIVE METABOLIC PANEL
ALT: 35 U/L (ref 0–44)
AST: 37 U/L (ref 15–41)
Albumin: 3.8 g/dL (ref 3.5–5.0)
Alkaline Phosphatase: 95 U/L (ref 38–126)
Anion gap: 9 (ref 5–15)
BUN: 27 mg/dL — ABNORMAL HIGH (ref 8–23)
CO2: 26 mmol/L (ref 22–32)
Calcium: 9.1 mg/dL (ref 8.9–10.3)
Chloride: 106 mmol/L (ref 98–111)
Creatinine, Ser: 0.6 mg/dL — ABNORMAL LOW (ref 0.61–1.24)
GFR, Estimated: >60 mL/min (ref >60)
Glucose, Bld: 167 mg/dL — ABNORMAL HIGH (ref 70–99)
Potassium: 3.4 mmol/L — ABNORMAL LOW (ref 3.5–5.1)
Sodium: 141 mmol/L (ref 135–145)
Total Bilirubin: 1.6 mg/dL — ABNORMAL HIGH (ref 0.3–1.2)
Total Protein: 6.1 g/dL — ABNORMAL LOW (ref 6.5–8.1)

## 2023-01-12 LAB — AMMONIA: Ammonia: 37 umol/L — ABNORMAL HIGH (ref 9–35)

## 2023-01-12 LAB — TYPE AND SCREEN
ABO/RH(D): B POS
Antibody Screen: NEGATIVE

## 2023-01-12 MED ORDER — OCTREOTIDE LOAD VIA INFUSION
50.0000 ug | Freq: Once | INTRAVENOUS | Status: AC
  Administered 2023-01-12: 50 ug via INTRAVENOUS
  Filled 2023-01-12: qty 25

## 2023-01-12 MED ORDER — PANTOPRAZOLE SODIUM 40 MG IV SOLR
40.0000 mg | Freq: Once | INTRAVENOUS | Status: AC
  Administered 2023-01-12: 40 mg via INTRAVENOUS
  Filled 2023-01-12: qty 10

## 2023-01-12 MED ORDER — HYDRALAZINE HCL 20 MG/ML IJ SOLN: 5.0000 mg | Freq: Four times a day (QID) | INTRAMUSCULAR | Status: DC | PRN

## 2023-01-12 MED ORDER — RIFAXIMIN 550 MG PO TABS
550.0000 mg | ORAL_TABLET | Freq: Two times a day (BID) | ORAL | Status: DC
  Administered 2023-01-12 – 2023-01-16 (×8): 550 mg via ORAL
  Filled 2023-01-12 (×8): qty 1

## 2023-01-12 MED ORDER — ESCITALOPRAM OXALATE 10 MG PO TABS
10.0000 mg | ORAL_TABLET | Freq: Every day | ORAL | Status: DC
  Administered 2023-01-13 – 2023-01-16 (×4): 10 mg via ORAL
  Filled 2023-01-12 (×4): qty 1

## 2023-01-12 MED ORDER — SODIUM CHLORIDE 0.9 % IV SOLN
50.0000 ug/h | INTRAVENOUS | Status: AC
  Administered 2023-01-12 – 2023-01-15 (×5): 50 ug/h via INTRAVENOUS
  Filled 2023-01-12 (×8): qty 1

## 2023-01-12 MED ORDER — TRIENTINE HCL 250 MG PO CAPS
4.0000 | ORAL_CAPSULE | Freq: Every day | ORAL | Status: DC
  Administered 2023-01-13 – 2023-01-15 (×3): 1000 mg via ORAL
  Filled 2023-01-12 (×3): qty 4

## 2023-01-12 MED ORDER — VITAMIN D 25 MCG (1000 UNIT) PO TABS
1000.0000 [IU] | ORAL_TABLET | Freq: Every day | ORAL | Status: DC
  Administered 2023-01-13 – 2023-01-16 (×4): 1000 [IU] via ORAL
  Filled 2023-01-12 (×4): qty 1

## 2023-01-12 MED ORDER — ADULT MULTIVITAMIN W/MINERALS CH
1.0000 | ORAL_TABLET | Freq: Every day | ORAL | Status: DC
  Administered 2023-01-13 – 2023-01-16 (×4): 1 via ORAL
  Filled 2023-01-12 (×4): qty 1

## 2023-01-12 MED ORDER — LACTULOSE 10 GM/15ML PO SOLN: 30.0000 g | Freq: Three times a day (TID) | ORAL | Status: DC | PRN

## 2023-01-12 MED ORDER — MAGNESIUM OXIDE -MG SUPPLEMENT 400 (240 MG) MG PO TABS
200.0000 mg | ORAL_TABLET | Freq: Every day | ORAL | Status: DC
  Administered 2023-01-13 – 2023-01-16 (×4): 200 mg via ORAL
  Filled 2023-01-12 (×4): qty 1

## 2023-01-12 MED ORDER — FERROUS GLUCONATE 324 (38 FE) MG PO TABS
324.0000 mg | ORAL_TABLET | Freq: Every day | ORAL | Status: DC
  Administered 2023-01-13 – 2023-01-16 (×4): 324 mg via ORAL
  Filled 2023-01-12 (×4): qty 1

## 2023-01-12 MED ORDER — TRIAMTERENE-HCTZ 37.5-25 MG PO TABS
1.0000 | ORAL_TABLET | Freq: Every day | ORAL | Status: DC
  Administered 2023-01-13 – 2023-01-15 (×3): 1 via ORAL
  Filled 2023-01-12 (×4): qty 1

## 2023-01-12 MED ORDER — ACETAMINOPHEN 650 MG RE SUPP: 650.0000 mg | Freq: Four times a day (QID) | RECTAL | Status: DC | PRN

## 2023-01-12 MED ORDER — ACETAMINOPHEN 325 MG PO TABS
650.0000 mg | ORAL_TABLET | Freq: Four times a day (QID) | ORAL | Status: DC | PRN
  Administered 2023-01-13 – 2023-01-14 (×2): 650 mg via ORAL
  Filled 2023-01-12 (×2): qty 2

## 2023-01-12 MED ORDER — PANTOPRAZOLE INFUSION (NEW) - SIMPLE MED
8.0000 mg/h | INTRAVENOUS | Status: DC
  Administered 2023-01-12 – 2023-01-14 (×6): 8 mg/h via INTRAVENOUS
  Filled 2023-01-12 (×7): qty 100

## 2023-01-12 MED ORDER — ALPRAZOLAM 0.25 MG PO TABS: 0.2500 mg | ORAL_TABLET | Freq: Every evening | ORAL | Status: DC | PRN

## 2023-01-12 MED ORDER — SODIUM CHLORIDE 0.9 % IV BOLUS
500.0000 mL | Freq: Once | INTRAVENOUS | Status: AC
  Administered 2023-01-12: 500 mL via INTRAVENOUS

## 2023-01-12 MED ORDER — TAMSULOSIN HCL 0.4 MG PO CAPS: 0.4000 mg | ORAL_CAPSULE | Freq: Every day | ORAL | Status: DC

## 2023-01-12 MED ORDER — LOSARTAN POTASSIUM 50 MG PO TABS
50.0000 mg | ORAL_TABLET | Freq: Every day | ORAL | Status: DC
  Administered 2023-01-13 – 2023-01-14 (×2): 50 mg via ORAL
  Filled 2023-01-12 (×4): qty 1

## 2023-01-12 MED ORDER — PANTOPRAZOLE SODIUM 40 MG IV SOLR: 40.0000 mg | Freq: Two times a day (BID) | INTRAVENOUS | Status: DC

## 2023-01-12 MED ORDER — ONDANSETRON HCL 4 MG/2ML IJ SOLN
4.0000 mg | Freq: Four times a day (QID) | INTRAMUSCULAR | Status: DC | PRN
  Administered 2023-01-13 – 2023-01-14 (×2): 4 mg via INTRAVENOUS
  Filled 2023-01-12 (×2): qty 2

## 2023-01-12 MED ORDER — NADOLOL 20 MG PO TABS
40.0000 mg | ORAL_TABLET | Freq: Every day | ORAL | Status: DC
  Administered 2023-01-14: 40 mg via ORAL
  Filled 2023-01-12 (×2): qty 2
  Filled 2023-01-12: qty 1
  Filled 2023-01-12 (×2): qty 2

## 2023-01-12 MED ORDER — SENNOSIDES-DOCUSATE SODIUM 8.6-50 MG PO TABS: 1.0000 | ORAL_TABLET | Freq: Every evening | ORAL | Status: DC | PRN

## 2023-01-12 MED ORDER — ONDANSETRON HCL 4 MG PO TABS: 4.0000 mg | ORAL_TABLET | Freq: Four times a day (QID) | ORAL | Status: DC | PRN

## 2023-01-12 MED ORDER — SODIUM CHLORIDE 0.9 % IV SOLN
2.0000 g | INTRAVENOUS | Status: AC
  Administered 2023-01-12 – 2023-01-15 (×4): 2 g via INTRAVENOUS
  Filled 2023-01-12 (×4): qty 20

## 2023-01-12 NOTE — ED Triage Notes (Signed)
Patient to ED via POV for rectal bleeding- Jack Powell, loose, tarry stools that started today. Endorses feeling weak and tired today as well. Did receive the flu shot 1 week ago.

## 2023-01-12 NOTE — Assessment & Plan Note (Signed)
-   Hydralazine 5 mg IV every 6 hours as needed for SBP greater than 175, 4 days ordered

## 2023-01-12 NOTE — Hospital Course (Signed)
[name] is a 72 year old male with history of hypertension, GERD, anxiety, who presents to the emergency department for chief concerns of black, loose tarry stools.  Vitals in the ED showed temperature of 98.3, respiration rate of 18, heart rate of 66, blood pressure 142/53, SpO2 99% on room air.  Serum sodium is 141, potassium 3.4, chloride 106, bicarb 26, BUN of 27, serum creatinine of 0.60, EGFR greater than 60, nonfasting blood glucose 167, WBC 3.7, hemoglobin initially 11.2 and on repeat was 10.4, platelets was 69 and on repeat was 63.  ED treatments: Sodium chloride 500 mL bolus, pantoprazole 40 mg IV one-time dose.

## 2023-01-12 NOTE — Progress Notes (Signed)
01/12/23: [facility] ED RN Hospital Liaison for [facility] evaluated/screened patient chart for potential or prior engagements pending disposition.  Insurance: [employer] Medicare PCP: Bethann Punches Practice: [facility]. 1ED/30days 2 ED/12 months 1IP/30days 1 IP/80Mo. 0/PACare.  Gabriel Cirri RN MSN  [place]  [facility], Population Health Title: ED RN Liaison Email: [email or web address] Direct Dial: Teams chat                        Website: [email or web address]     Note: [facility] ED liaison does not counter or interfere with Inpatient Transitions of Care discharge planning or disposition.

## 2023-01-12 NOTE — Assessment & Plan Note (Addendum)
Patient's platelet range is 63-94 this year Continue to monitor, recheck CBC in a.m.

## 2023-01-12 NOTE — Assessment & Plan Note (Signed)
Secondary to Wilson's disease

## 2023-01-12 NOTE — Assessment & Plan Note (Addendum)
Patient is status post pantoprazole 40 mg IV one-time dose per EDP I have ordered additional pantoprazole 40 mg IV one-time dose on admission to complete 80 mg bolus Pantoprazole infusion initiated Octreotide bolus and infusion initiated and ceftriaxone 2 g IV daily ordered at the request of GI specialist Gastroenterology specialist has been consulted on admission via secure chat and Epic order, Dr. Allegra Lai is aware PIV: Please ensure and maintain 2 PIV (prefer large-bore) Goal hemoglobin greater than 8 Admit to PCU, inpatient; Currently stable. I discussed with RN at bedside, that I have low threshold to admit to stepdown.

## 2023-01-12 NOTE — Assessment & Plan Note (Signed)
Continue to monitor, currently within baseline range

## 2023-01-12 NOTE — Assessment & Plan Note (Signed)
Continue to monitor and follow-up with outpatient hepatologist as appropriate

## 2023-01-12 NOTE — ED Notes (Signed)
Patient care taken, no complaints at this time ?

## 2023-01-12 NOTE — ED Provider Notes (Signed)
[facility] Provider Note    Event Date/Time   First MD Initiated Contact with Patient 01/12/23 1456     (approximate)   History   Chief Complaint: Rectal Bleeding   HPI  Jack Powell is a 72 y.o. 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  He reports having 6 bowel movement episodes with this appearance.  With this he is also feeling fatigued, dizzy with standing.  Denies chest pain shortness of breath or abdominal pain.  Does not take blood thinners.  No falls or trauma.  No fever.  Prior to this occurring this morning, he was in his usual state of health.     Physical Exam   Triage Vital Signs: ED Triage Vitals [01/12/23 1244]  Encounter Vitals Group     BP (!) 142/53     Systolic BP Percentile      Diastolic BP Percentile      Pulse Rate 66     Resp 18     Temp 98.3 F (36.8 C)     Temp Source Oral     SpO2 99 %     Weight 144 lb (65.3 kg)     Height 6' (1.829 m)     Head Circumference      Peak Flow      Pain Score 0     Pain Loc      Pain Education      Exclude from Growth Chart     Most recent vital signs: Vitals:   01/12/23 1244 01/12/23 1600  BP: (!) 142/53 131/82  Pulse: 66 67  Resp: 18   Temp: 98.3 F (36.8 C)   SpO2: 99% 100%    General: Awake, no distress.  CV:  Good peripheral perfusion.  Regular rate and rhythm Resp:  Normal effort.  Clear to auscultation bilaterally Abd:  No distention.  Soft nontender.  Spleen is palpable 4 cm below the costal margin.  Rectal exam shows small amount of thin dark stool, Hemoccult positive.  No ascites. Other:  No lower extremity edema.   ED Results / Procedures / Treatments   Labs (all labs ordered are listed, but only abnormal results are displayed) Labs Reviewed  COMPREHENSIVE METABOLIC PANEL - Abnormal; Notable for the following components:       Result Value   Potassium 3.4 (*)    Glucose, Bld 167 (*)    BUN 27 (*)    Creatinine, Ser 0.60 (*)    Total Protein 6.1 (*)    Total Bilirubin 1.6 (*)    All other components within normal limits  CBC - Abnormal; Notable for the following components:   WBC 3.6 (*)    RBC 3.16 (*)    Hemoglobin 11.2 (*)    HCT 33.5 (*)    MCV 106.0 (*)    MCH 35.4 (*)    RDW 16.4 (*)    Platelets 69 (*)    All other components within normal limits  CBC WITH DIFFERENTIAL/PLATELET - Abnormal; Notable for the following components:   WBC 3.7 (*)    RBC 2.92 (*)    Hemoglobin 10.4 (*)    HCT 31.7 (*)    MCV 108.6 (*)    MCH 35.6 (*)    RDW 16.5 (*)    Platelets 63 (*)    All other components within normal limits  AMMONIA - Abnormal; Notable for the following components:   Ammonia 37 (*)    All other components within normal limits  POC OCCULT BLOOD, ED  TYPE AND SCREEN     EKG    RADIOLOGY    PROCEDURES:  Procedures   MEDICATIONS ORDERED IN ED: Medications  sodium chloride 0.9 % bolus 500 mL (500 mLs Intravenous New Bag/Given 01/12/23 1549)  pantoprazole (PROTONIX) injection 40 mg (40 mg Intravenous Given 01/12/23 1549)     IMPRESSION / MDM / ASSESSMENT AND PLAN / ED COURSE  I reviewed the triage vital signs and the nursing notes.  DDx: Anemia, AKI, electrolyte abnormality, peptic ulcer disease, portal hypertension  Patient's presentation is most consistent with acute presentation with potential threat to life or bodily function.  Patient presents with occult GI bleed.  Initial hemoglobin is at baseline.  Chemistry panel unremarkable.  Will give fluids and Protonix while trending hemoglobin and obtaining ammonia level.   ----------------------------------------- 4:47 PM on 01/12/2023 ----------------------------------------- Repeat hemoglobin shows a 1 point drop in the last 3 hours.  No signs of shock, not requiring transfusion right now.  Case discussed with hospitalist  for further management.      FINAL CLINICAL IMPRESSION(S) / ED DIAGNOSES   Final diagnoses:  Acute upper GI bleed  Wilson's disease     Rx / DC Orders   ED Discharge Orders     None        Note:  This document was prepared using Dragon voice recognition software and may include unintentional dictation errors.   Sharman Cheek, MD 01/12/23 [phone]

## 2023-01-12 NOTE — H&P (Signed)
History and Physical   Jack Powell ZOX:[id number] DOB: October 28, 1950 DOA: 01/12/2023  PCP: Jack Penton, MD  Outpatient Specialists: [facility] gastroenterology Patient coming from: Home via POV  I have personally briefly reviewed patient's old medical records in [facility] EMR.  Chief Concern: Black loose tarry stools  HPI: [name] is a 72 year old male with history of hypertension, GERD, anxiety, who presents to the emergency department for chief concerns of black, loose tarry stools.  Vitals in the ED showed temperature of 98.3, respiration rate of 18, heart rate of 66, blood pressure 142/53, SpO2 99% on room air.  Serum sodium is 141, potassium 3.4, chloride 106, bicarb 26, BUN of 27, serum creatinine of 0.60, EGFR greater than 60, nonfasting blood glucose 167, WBC 3.7, hemoglobin initially 11.2 and on repeat was 10.4, platelets was 69 and on repeat was 63.  ED treatments: Sodium chloride 500 mL bolus, pantoprazole 40 mg IV one-time dose. --------------------------- At bedside, patient is able to tell me his name, age, location, and current calendar year. He appears weak.   He reports 5-6 tarry, watery bowel movements today. Over the last two days, he has been having sporadic tarry dark bowel movements. This is a new symptom for patient.  He denies vomiting of blood and/or coffee ground emesis.  Patient endorses taking iron supplements for many years. He reports that he does not have black bowel movements regularly while taking iron supplements.  He endorses history of variceal bleeding.   Social history: He lives at home with his spouse. He denies tobacco, etoh, and recreational drug use. He is semi-retired.  ROS: Constitutional: no weight change, no fever ENT/Mouth: no sore throat, no rhinorrhea Eyes: no eye pain, no vision changes Cardiovascular: no chest pain, no dyspnea,  no edema, no palpitations Respiratory: no cough, no  sputum, no wheezing Gastrointestinal: no nausea, no vomiting, + black, tarry diarrhea, no constipation Genitourinary: no urinary incontinence, no dysuria, no hematuria Musculoskeletal: no arthralgias, no myalgias Skin: no skin lesions, no pruritus, Neuro: + weakness, no loss of consciousness, no syncope Psych: no anxiety, no depression, + decrease appetite Heme/Lymph: no bruising, no bleeding  ED Course: Discussed with emergency medicine provider, patient requiring hospitalization for chief concerns of upper GI bleed.  Assessment/Plan  Principal Problem:   Upper GI bleed Active Problems:   Wilson's disease   Liver cirrhosis (HCC)   Essential hypertension   Pancytopenia (HCC)   Thrombocytopenia (HCC)   Assessment and Plan:  * Upper GI bleed Patient is status post pantoprazole 40 mg IV one-time dose per EDP I have ordered additional pantoprazole 40 mg IV one-time dose on admission to complete 80 mg bolus Pantoprazole infusion initiated Octreotide bolus and infusion initiated and ceftriaxone 2 g IV daily ordered at the request of GI specialist Gastroenterology specialist has been consulted on admission via secure chat and Epic order, Dr. Allegra Powell is aware PIV: Please ensure and maintain 2 PIV (prefer large-bore) Goal hemoglobin greater than 8 Admit to PCU, inpatient; Currently stable. I discussed with RN at bedside, that I have low threshold to admit to stepdown.   Thrombocytopenia (HCC) Patient's platelet range is 63-94 this year Continue to monitor, recheck CBC in a.m.  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   DVT prophylaxis: Ted hose Code Status: Full code Diet:  Clear liquid; n.p.o. after midnight Family Communication: updated and discussed with spouse via patient's mobile speaker phone Disposition Plan: Pending clinical course Consults called: Gastroenterology Admission status: PCU, inpatient  Past Medical History:  Diagnosis Date   Anemia    Cirrhosis (HCC)    Diabetes mellitus without complication (HCC)    Esophagitis    GERD (gastroesophageal reflux disease)    History of colon polyps    Hypertension    Skin cancer    Wilson disease    Past Surgical History:  Procedure Laterality Date   CATARACT EXTRACTION W/PHACO Right 02/27/2020   Procedure: CATARACT EXTRACTION PHACO AND INTRAOCULAR LENS PLACEMENT (IOC) RIGHT DIABETIC 3.78   00:55.4 6.8%;  Surgeon: Jack Mola, MD;  Location: [facility];  Service: Ophthalmology;  Laterality: Right;  Diabetic - oral meds   CATARACT EXTRACTION W/PHACO Left 03/12/2020   Procedure: CATARACT EXTRACTION PHACO AND INTRAOCULAR LENS PLACEMENT (IOC) LEFT DIABETIC 1.54 00:29.3 5.2%;  Surgeon: Jack Mola, MD;  Location: [facility];  Service: Ophthalmology;  Laterality: Left;  patient prefers arrival after 10am   COLONOSCOPY     ESOPHAGOGASTRODUODENOSCOPY (EGD) WITH PROPOFOL N/A 05/05/2016   Procedure: ESOPHAGOGASTRODUODENOSCOPY (EGD) WITH PROPOFOL;  Surgeon: Jack Jun, MD;  Location: [facility] ENDOSCOPY;  Service: Endoscopy;  Laterality: N/A;   LIVER BIOPSY     SKIN CANCER EXCISION     TONSILLECTOMY     UVULOPALATOPHARYNGOPLASTY  2005   Social History:  reports that he has never smoked. He has never used smokeless tobacco. He reports that he does not drink alcohol and does not use drugs.  Allergies  Allergen Reactions   Ace Inhibitors Cough   Serotonin Other (See Comments)    hallucinations   Penicillins Rash   Family History  Problem Relation Age of Onset   Diabetes Mother    Heart disease Mother    Cancer Father        colon   Family history: Family history  reviewed and not pertinent.  Prior to Admission medications   Medication Sig Start Date End Date Taking? Authorizing Provider  ALPRAZolam Prudy Feeler) 0.25 MG tablet Take 0.25 mg by mouth at bedtime as needed for anxiety.    [provider]  cholecalciferol (VITAMIN D) 1000 units tablet Take 1,000 Units by mouth daily.    [provider]  empagliflozin (JARDIANCE) 25 MG TABS tablet Take by mouth 2 (two) times daily.    [provider]  glimepiride (AMARYL) 2 MG tablet Take 2 mg by mouth 2 (two) times a day.     [provider]  lactulose, encephalopathy, (CHRONULAC) 10 GM/15ML SOLN Take 30 g by mouth 3 (three) times daily.    [provider]  losartan (COZAAR) 50 MG tablet Take 50 mg by mouth daily.    [provider]  Magnesium 250 MG TABS Take by mouth daily.    [provider]  MULTIPLE VITAMIN PO Take 1 tablet by mouth daily.     [provider]  nadolol (CORGARD) 40 MG tablet Take 40 mg by mouth daily.    [provider]  pantoprazole (PROTONIX) 40 MG tablet Take 40 mg by mouth daily.    [provider]  rifaximin (XIFAXAN) 550 MG TABS tablet Take 550 mg by mouth 2 (two) times daily.    [provider]  triamterene-hydrochlorothiazide (MAXZIDE-25) 37.5-25 MG tablet Take  1 tablet by mouth daily.    [provider]  Trientine HCl (SYPRINE) 250 MG CAPS Take 4 capsules by mouth daily.     [provider]   Physical Exam: Vitals:   01/12/23 1244 01/12/23 1600 01/12/23 1900 01/12/23 2012  BP: (!) 142/53 131/82  128/78  Pulse: 66 67 68 66  Resp: 18   18  Temp: 98.3 F (36.8 C)   98.2 F (36.8 C)  TempSrc: Oral   Oral  SpO2: 99% 100% 99% 99%  Weight: 65.3 kg     Height: 6' (1.829 m)      Constitutional: appears older than chronological age, frail, malnourished Eyes: PERRL, lids and conjunctivae normal ENMT: Mucous membranes are moist. Posterior pharynx clear of any exudate or  lesions. Age-appropriate dentition. Hearing appropriate Neck: normal, supple, no masses, no thyromegaly Respiratory: clear to auscultation bilaterally, no wheezing, no crackles. Normal respiratory effort. No accessory muscle use.  Cardiovascular: Regular rate and rhythm, no murmurs / rubs / gallops. No extremity edema. 2+ pedal pulses. No carotid bruits.  Abdomen: no tenderness, no masses palpated, no hepatosplenomegaly. Bowel sounds positive.  Musculoskeletal: no clubbing / cyanosis. No joint deformity upper and lower extremities. Good ROM, no contractures, no atrophy. Normal muscle tone.  Skin: no rashes, lesions, ulcers. No induration Neurologic: Sensation intact. Strength 5/5 in all 4.  Psychiatric: Normal judgment and insight. Alert and oriented x 3. Normal mood.   EKG: ordered and pending completion at the time of this writing.  Chest x-ray on Admission: not indicated at this time  Labs on Admission: I have personally reviewed following labs  CBC: Recent Labs  Lab 01/12/23 1246 01/12/23 1537  WBC 3.6* 3.7*  NEUTROABS  --  2.4  HGB 11.2* 10.4*  HCT 33.5* 31.7*  MCV 106.0* 108.6*  PLT 69* 63*   Basic Metabolic Panel: Recent Labs  Lab 01/12/23 1246  NA 141  K 3.4*  CL 106  CO2 26  GLUCOSE 167*  BUN 27*  CREATININE 0.60*  CALCIUM 9.1   GFR: Estimated Creatinine Clearance: 80.5 mL/min (A) (by C-G formula based on SCr of 0.6 mg/dL (L)).  Liver Function Tests: Recent Labs  Lab 01/12/23 1246  AST 37  ALT 35  ALKPHOS 95  BILITOT 1.6*  PROT 6.1*  ALBUMIN 3.8   Recent Labs  Lab 01/12/23 1537  AMMONIA 37*   CRITICAL CARE Performed by: Dr. Sedalia Muta  Total critical care time: 32 minutes  Critical care time was exclusive of separately billable procedures and treating other patients.  Critical care was necessary to treat or prevent imminent or life-threatening deterioration.  Critical care was time spent personally by me on the following activities: development  of treatment plan with patient and/or surrogate as well as nursing, discussions with consultants, evaluation of patient's response to treatment, examination of patient, obtaining history from patient or surrogate, ordering and performing treatments and interventions, ordering and review of laboratory studies, ordering and review of radiographic studies, pulse oximetry and re-evaluation of patient's condition.  This document was prepared using Dragon Voice Recognition software and may include unintentional dictation errors.  Dr. Sedalia Muta [facility]  If 7PM-7AM, please contact overnight-coverage provider If 7AM-7PM, please contact day attending provider [email or web address]  01/12/2023, 10:31 PM

## 2023-01-13 ENCOUNTER — Encounter: Payer: Self-pay | Admitting: Internal Medicine

## 2023-01-13 ENCOUNTER — Inpatient Hospital Stay: Payer: Medicare HMO | Admitting: Anesthesiology

## 2023-01-13 ENCOUNTER — Encounter
Admission: EM | Disposition: A | Discharge status: Home / Self Care | Payer: Self-pay | Source: Home / Self Care | Attending: Internal Medicine

## 2023-01-13 DIAGNOSIS — K921 Melena: Secondary | ICD-10-CM

## 2023-01-13 DIAGNOSIS — I8510 Secondary esophageal varices without bleeding: Secondary | ICD-10-CM

## 2023-01-13 DIAGNOSIS — K31811 Angiodysplasia of stomach and duodenum with bleeding: Secondary | ICD-10-CM

## 2023-01-13 DIAGNOSIS — D62 Acute posthemorrhagic anemia: Secondary | ICD-10-CM | POA: Diagnosis not present

## 2023-01-13 DIAGNOSIS — K7469 Other cirrhosis of liver: Secondary | ICD-10-CM | POA: Diagnosis not present

## 2023-01-13 DIAGNOSIS — K31819 Angiodysplasia of stomach and duodenum without bleeding: Secondary | ICD-10-CM | POA: Diagnosis not present

## 2023-01-13 DIAGNOSIS — K922 Gastrointestinal hemorrhage, unspecified: Principal | ICD-10-CM

## 2023-01-13 DIAGNOSIS — I8511 Secondary esophageal varices with bleeding: Secondary | ICD-10-CM

## 2023-01-13 HISTORY — PX: ESOPHAGOGASTRODUODENOSCOPY: SHX5428

## 2023-01-13 HISTORY — PX: ESOPHAGEAL BANDING: SHX5518

## 2023-01-13 HISTORY — PX: HOT HEMOSTASIS: SHX5433

## 2023-01-13 LAB — CBC
HCT: 27.9 % — ABNORMAL LOW (ref 39.0–52.0)
HCT: 31.1 % — ABNORMAL LOW (ref 39.0–52.0)
HCT: 29.4 % — ABNORMAL LOW (ref 39.0–52.0)
Hemoglobin: 9.4 g/dL — ABNORMAL LOW (ref 13.0–17.0)
Hemoglobin: 10.4 g/dL — ABNORMAL LOW (ref 13.0–17.0)
Hemoglobin: 9.9 g/dL — ABNORMAL LOW (ref 13.0–17.0)
MCH: 35.9 pg — ABNORMAL HIGH (ref 26.0–34.0)
MCH: 35.7 pg — ABNORMAL HIGH (ref 26.0–34.0)
MCH: 35.4 pg — ABNORMAL HIGH (ref 26.0–34.0)
MCHC: 33.7 g/dL (ref 30.0–36.0)
MCHC: 33.4 g/dL (ref 30.0–36.0)
MCHC: 33.7 g/dL (ref 30.0–36.0)
MCV: 106.5 fL — ABNORMAL HIGH (ref 80.0–100.0)
MCV: 106.9 fL — ABNORMAL HIGH (ref 80.0–100.0)
MCV: 105 fL — ABNORMAL HIGH (ref 80.0–100.0)
Platelets: 55 10*3/uL — ABNORMAL LOW (ref 150–400)
Platelets: 63 10*3/uL — ABNORMAL LOW (ref 150–400)
Platelets: 71 10*3/uL — ABNORMAL LOW (ref 150–400)
RBC: 2.62 MIL/uL — ABNORMAL LOW (ref 4.22–5.81)
RBC: 2.91 MIL/uL — ABNORMAL LOW (ref 4.22–5.81)
RBC: 2.8 MIL/uL — ABNORMAL LOW (ref 4.22–5.81)
RDW: 16.6 % — ABNORMAL HIGH (ref 11.5–15.5)
RDW: 16.8 % — ABNORMAL HIGH (ref 11.5–15.5)
RDW: 16.8 % — ABNORMAL HIGH (ref 11.5–15.5)
WBC: 2.5 10*3/uL — ABNORMAL LOW (ref 4.0–10.5)
WBC: 3 10*3/uL — ABNORMAL LOW (ref 4.0–10.5)
WBC: 3.9 10*3/uL — ABNORMAL LOW (ref 4.0–10.5)
nRBC: 0 % (ref 0.0–0.2)
nRBC: 0 % (ref 0.0–0.2)
nRBC: 0 % (ref 0.0–0.2)

## 2023-01-13 LAB — IRON AND TIBC
Iron: 108 ug/dL (ref 45–182)
Saturation Ratios: 36 % (ref 17.9–39.5)
TIBC: 300 ug/dL (ref 250–450)
UIBC: 192 ug/dL

## 2023-01-13 LAB — BASIC METABOLIC PANEL
Anion gap: 9 (ref 5–15)
BUN: 23 mg/dL (ref 8–23)
CO2: 24 mmol/L (ref 22–32)
Calcium: 7.9 mg/dL — ABNORMAL LOW (ref 8.9–10.3)
Chloride: 110 mmol/L (ref 98–111)
Creatinine, Ser: 0.64 mg/dL (ref 0.61–1.24)
GFR, Estimated: >60 mL/min (ref >60)
Glucose, Bld: 93 mg/dL (ref 70–99)
Potassium: 3.5 mmol/L (ref 3.5–5.1)
Sodium: 143 mmol/L (ref 135–145)

## 2023-01-13 LAB — FERRITIN: Ferritin: 79 ng/mL (ref 24–336)

## 2023-01-13 LAB — FOLATE: Folate: 20.7 ng/mL (ref >5.9)

## 2023-01-13 LAB — PROTIME-INR
INR: 1.4 — ABNORMAL HIGH (ref 0.8–1.2)
Prothrombin Time: 17.7 s — ABNORMAL HIGH (ref 11.4–15.2)

## 2023-01-13 LAB — VITAMIN B12: Vitamin B-12: 995 pg/mL — ABNORMAL HIGH (ref 180–914)

## 2023-01-13 LAB — ABO/RH: ABO/RH(D): B POS

## 2023-01-13 LAB — HIV ANTIBODY (ROUTINE TESTING W REFLEX): HIV Screen 4th Generation wRfx: NONREACTIVE

## 2023-01-13 SURGERY — ESOPHAGOGASTRODUODENOSCOPY (EGD) WITH PROPOFOL
Anesthesia: General

## 2023-01-13 SURGERY — EGD (ESOPHAGOGASTRODUODENOSCOPY)
Anesthesia: General

## 2023-01-13 MED ORDER — LIDOCAINE HCL (CARDIAC) PF 100 MG/5ML IV SOSY
PREFILLED_SYRINGE | INTRAVENOUS | Status: DC | PRN
  Administered 2023-01-13: 40 mg via INTRAVENOUS

## 2023-01-13 MED ORDER — PROPOFOL 10 MG/ML IV BOLUS
INTRAVENOUS | Status: AC
  Filled 2023-01-13: qty 40

## 2023-01-13 MED ORDER — PROPOFOL 10 MG/ML IV BOLUS
INTRAVENOUS | Status: DC | PRN
  Administered 2023-01-13 (×2): 20 mg via INTRAVENOUS
  Administered 2023-01-13: 80 mg via INTRAVENOUS
  Administered 2023-01-13 (×3): 20 mg via INTRAVENOUS

## 2023-01-13 MED ORDER — SODIUM CHLORIDE 0.9 % IV SOLN: INTRAVENOUS | Status: DC

## 2023-01-13 MED ORDER — TAMSULOSIN HCL 0.4 MG PO CAPS
0.4000 mg | ORAL_CAPSULE | Freq: Every day | ORAL | Status: DC
  Administered 2023-01-13 – 2023-01-15 (×3): 0.4 mg via ORAL
  Filled 2023-01-13 (×3): qty 1

## 2023-01-13 MED ORDER — SODIUM CHLORIDE 0.9% IV SOLUTION: Freq: Once | INTRAVENOUS | Status: DC

## 2023-01-13 MED ORDER — FENTANYL CITRATE PF 50 MCG/ML IJ SOSY
50.0000 ug | PREFILLED_SYRINGE | Freq: Once | INTRAMUSCULAR | Status: AC
  Administered 2023-01-13: 50 ug via INTRAVENOUS

## 2023-01-13 MED ORDER — FENTANYL CITRATE PF 50 MCG/ML IJ SOSY
PREFILLED_SYRINGE | INTRAMUSCULAR | Status: AC
  Filled 2023-01-13: qty 1

## 2023-01-13 NOTE — OR Nursing (Signed)
Jack Powell c/o headache orders rec'd to give fentanyl per Dr Randa Ngo. Administered fentanyl and sats dropped to 79 % on room air, applied oxygen via nasal cannula at 3l/min. Notified Dr. Randa Ngo, will continue to monitor while we wait until a room is assigned.

## 2023-01-13 NOTE — Consult Note (Signed)
Arlyss Repress, MD [address]  [address]  [place], [place] [address]  Main: [phone]  Fax: [phone] Pager: [phone]   Consultation  Referring Provider:     No ref. provider found Primary Care Physician:  Danella Penton, MD Primary Gastroenterologist:  Dr. Milta Deiters, hepatologist, [facility] Reason for Consultation:   Melena, anemia, cirrhosis of liver  Date of Admission:  01/12/2023 Date of Consultation:  01/13/2023         HPI:   Jack Powell is a 72 y.o. male with Wilson's disease, cirrhosis of liver, esophageal varices presented with black tarry stools started yesterday associated with fatigue.  He was hemodynamically stable in the ER but he was also on nadolol for primary prophylaxis of esophageal varices.  Vitals revealed elevated BUN/creatinine 27/0.68, hemoglobin dropped from 11.2-9.4, platelets 55.  When interviewed the patient, he was lying in bed comfortable, his wife is bedside.  He denies any abdominal distention, rectal bleeding.  He takes lactulose which causes loose stools and he takes oral iron which leads to dark stools.  He clearly says his stools are black tarry compared to what they are at baseline.  He also reports intermittent swelling of ankles.  He has seen his primary gastroenterologist Dr. Carrie Mew at [facility] on 10/01/2022. Patient is started on pantoprazole drip, octreotide drip and ceftriaxone for SBP prophylaxis  NSAIDs: None  Antiplts/Anticoagulants/Anti thrombotics: None  GI Procedures:  Upper endoscopy 04/2016 by Dr. Mechele Collin - Grade II esophageal varices. - Congestive gastropathy. - No specimens collected.  Colonoscopy 06/03/2021 Findings  Suboptimal prep  Multiple small pancolonic angioectasias; no bleeding was identified  Prominent rectal veins.   Past Medical History:  Diagnosis Date   Anemia    Cirrhosis (HCC)    Diabetes mellitus without complication (HCC)    Esophagitis    GERD  (gastroesophageal reflux disease)    History of colon polyps    Hypertension    Skin cancer    Wilson disease     Past Surgical History:  Procedure Laterality Date   CATARACT EXTRACTION W/PHACO Right 02/27/2020   Procedure: CATARACT EXTRACTION PHACO AND INTRAOCULAR LENS PLACEMENT (IOC) RIGHT DIABETIC 3.78   00:55.4 6.8%;  Surgeon: Lockie Mola, MD;  Location[facility];  Service: Ophthalmology;  Laterality: Right;  Diabetic - oral meds   CATARACT EXTRACTION W/PHACO Left 03/12/2020   Procedure: CATARACT EXTRACTION PHACO AND INTRAOCULAR LENS PLACEMENT (IOC) LEFT DIABETIC 1.54 00:29.3 5.2%;  Surgeon: Lockie Mola, MD;  Location: [facility];  Service: Ophthalmology;  Laterality: Left;  patient prefers arrival after 10am   COLONOSCOPY     ESOPHAGOGASTRODUODENOSCOPY (EGD) WITH PROPOFOL N/A 05/05/2016   Procedure: ESOPHAGOGASTRODUODENOSCOPY (EGD) WITH PROPOFOL;  Surgeon: Scot Jun, MD;  Location: [facility] ENDOSCOPY;  Service: Endoscopy;  Laterality: N/A;   LIVER BIOPSY     SKIN CANCER EXCISION     TONSILLECTOMY     UVULOPALATOPHARYNGOPLASTY  2005     Current Facility-Administered Medications:    0.9 %  sodium chloride infusion, , Intravenous, Continuous, Idil Maslanka, Loel Dubonnet, MD, Last Rate: 20 mL/hr at 01/13/23 1209, Continued from Pre-op at 01/13/23 1209   [MAR Hold] acetaminophen (TYLENOL) tablet 650 mg, 650 mg, Oral, Q6H PRN **OR** [MAR Hold] acetaminophen (TYLENOL) suppository 650 mg, 650 mg, Rectal, Q6H PRN, Cox, Amy N, DO   [MAR Hold] ALPRAZolam (XANAX) tablet 0.25 mg, 0.25 mg, Oral, QHS PRN, Cox, Amy N, DO   [MAR Hold] cefTRIAXone (ROCEPHIN) 2  g in sodium chloride 0.9 % 100 mL IVPB, 2 g, Intravenous, Q24H, Cox, Amy N, DO, Stopped at 01/12/23 1905   [MAR Hold] cholecalciferol (VITAMIN D3) 25 MCG (1000 UNIT) tablet 1,000 Units, 1,000 Units, Oral, Daily, Cox, Amy N, DO, 1,000 Units at 01/13/23 1017   [MAR Hold] escitalopram (LEXAPRO) tablet 10 mg, 10 mg,  Oral, Daily, Cox, Amy N, DO, 10 mg at 01/13/23 1017   [MAR Hold] ferrous gluconate (FERGON) tablet 324 mg, 324 mg, Oral, Q breakfast, Cox, Amy N, DO, 324 mg at 01/13/23 1018   [MAR Hold] hydrALAZINE (APRESOLINE) injection 5 mg, 5 mg, Intravenous, Q6H PRN, Cox, Amy N, DO   [MAR Hold] losartan (COZAAR) tablet 50 mg, 50 mg, Oral, Daily, Cox, Amy N, DO, 50 mg at 01/13/23 1017   [MAR Hold] magnesium oxide (MAG-OX) tablet 200 mg, 200 mg, Oral, Daily, Cox, Amy N, DO, 200 mg at 01/13/23 1023   [MAR Hold] multivitamin with minerals tablet 1 tablet, 1 tablet, Oral, Daily, Cox, Amy N, DO, 1 tablet at 01/13/23 1018   [MAR Hold] nadolol (CORGARD) tablet 40 mg, 40 mg, Oral, Daily, Cox, Amy N, DO   [COMPLETED] octreotide (SANDOSTATIN) 2 mcg/mL load via infusion 50 mcg, 50 mcg, Intravenous, Once, 50 mcg at 01/12/23 1940 **AND** octreotide (SANDOSTATIN) 500 mcg in sodium chloride 0.9 % 250 mL (2 mcg/mL) infusion, 50 mcg/hr, Intravenous, Continuous, Cox, Amy N, DO, Last Rate: 25 mL/hr at 01/13/23 0546, 50 mcg/hr at 01/13/23 0546   [MAR Hold] ondansetron (ZOFRAN) tablet 4 mg, 4 mg, Oral, Q6H PRN **OR** [MAR Hold] ondansetron (ZOFRAN) injection 4 mg, 4 mg, Intravenous, Q6H PRN, Cox, Amy N, DO   [MAR Hold] pantoprazole (PROTONIX) injection 40 mg, 40 mg, Intravenous, Q12H, Cox, Amy N, DO   pantoprozole (PROTONIX) 80 mg /NS 100 mL infusion, 8 mg/hr, Intravenous, Continuous, Cox, Amy N, DO, Last Rate: 10 mL/hr at 01/13/23 0547, 8 mg/hr at 01/13/23 0547   [MAR Hold] rifaximin (XIFAXAN) tablet 550 mg, 550 mg, Oral, BID, Cox, Amy N, DO, 550 mg at 01/13/23 1018   [MAR Hold] senna-docusate (Senokot-S) tablet 1 tablet, 1 tablet, Oral, QHS PRN, Cox, Amy N, DO   [MAR Hold] tamsulosin (FLOMAX) capsule 0.4 mg, 0.4 mg, Oral, QHS, Sreenath, Sudheer B, MD   [MAR Hold] triamterene-hydrochlorothiazide (MAXZIDE-25) 37.5-25 MG per tablet 1 tablet, 1 tablet, Oral, Daily, Cox, Amy N, DO, 1 tablet at 01/13/23 1019   [MAR Hold] Trientine HCl  CAPS 1,000 mg, 4 capsule, Oral, Daily, Cox, Amy N, DO, 1,000 mg at 01/13/23 0919  Facility-Administered Medications Ordered in Other Encounters:    lidocaine (cardiac) 100 mg/29mL (XYLOCAINE) injection 2%, , Intravenous, Anesthesia Intra-op, Lanell Matar, CRNA, 40 mg at 01/13/23 1212   Family History  Problem Relation Age of Onset   Diabetes Mother    Heart disease Mother    Cancer Father        colon     Social History   Tobacco Use   Smoking status: Never   Smokeless tobacco: Never  Vaping Use   Vaping status: Never Used  Substance Use Topics   Alcohol use: No   Drug use: No    Allergies as of 01/12/2023 - Review Complete 01/12/2023  Allergen Reaction Noted   Ace inhibitors Cough 10/08/2016   Serotonin Other (See Comments) 10/31/2017   Penicillins Rash 03/15/2016    Review of Systems:    All systems reviewed and negative except where noted in HPI.   Physical Exam:  Vital signs in last 24 hours: Temp:  [97.7 F (36.5 C)-98.3 F (36.8 C)] 97.7 F (36.5 C) (09/05 1134) Pulse Rate:  [58-68] 59 (09/05 1134) Resp:  [16-18] 16 (09/05 1134) BP: (116-142)/(48-82) 139/62 (09/05 1134) SpO2:  [96 %-100 %] 100 % (09/05 1134) Weight:  [65.3 kg] 65.3 kg (09/05 1134) Last BM Date : 01/12/23  General:   Pleasant, cooperative in NAD Head:  Normocephalic and atraumatic. Eyes:   No icterus.   Conjunctiva pink. PERRLA. Ears:  Normal auditory acuity. Neck:  Supple; no masses or thyroidomegaly Lungs: Respirations even and unlabored. Lungs clear to auscultation bilaterally.   No wheezes, crackles, or rhonchi.  Heart:  Regular rate and rhythm;  Without murmur, clicks, rubs or gallops Abdomen:  Soft, nondistended, nontender. Normal bowel sounds. No appreciable masses or hepatomegaly.  No rebound or guarding.  Rectal:  Not performed. Msk:  Symmetrical without gross deformities.  Strength generalized weakness Extremities:  Without edema, cyanosis or clubbing. Neurologic:  Alert  and oriented x3;  grossly normal neurologically. Skin:  Intact without significant lesions or rashes. Psych:  Alert and cooperative. Normal affect.  LAB RESULTS:    Latest Ref Rng & Units 01/13/2023    4:44 AM 01/12/2023    3:37 PM 01/12/2023   12:46 PM  CBC  WBC 4.0 - 10.5 K/uL 2.5  3.7  3.6   Hemoglobin 13.0 - 17.0 g/dL 9.4  40.9  81.1   Hematocrit 39.0 - 52.0 % 27.9  31.7  33.5   Platelets 150 - 400 K/uL 55  63  69     BMET    Latest Ref Rng & Units 01/13/2023    4:44 AM 01/12/2023   12:46 PM  BMP  Glucose 70 - 99 mg/dL 93  914   BUN 8 - 23 mg/dL 23  27   Creatinine 7.82 - 1.24 mg/dL 9.56  2.13   Sodium 086 - 145 mmol/L 143  141   Potassium 3.5 - 5.1 mmol/L 3.5  3.4   Chloride 98 - 111 mmol/L 110  106   CO2 22 - 32 mmol/L 24  26   Calcium 8.9 - 10.3 mg/dL 7.9  9.1     LFT    Latest Ref Rng & Units 01/12/2023   12:46 PM  Hepatic Function  Total Protein 6.5 - 8.1 g/dL 6.1   Albumin 3.5 - 5.0 g/dL 3.8   AST 15 - 41 U/L 37   ALT 0 - 44 U/L 35   Alk Phosphatase 38 - 126 U/L 95   Total Bilirubin 0.3 - 1.2 mg/dL 1.6      STUDIES: No results found.    Impression / Plan:   Jack Powell is a 72 y.o. male with Wilson's disease, cirrhosis of liver with esophageal varices presented with melena, acute on chronic symptomatic anemia  Melena with acute on chronic symptomatic anemia: No further episodes of melena since admission Continue PPI, octreotide, ceftriaxone Monitor CBC closely Check iron panel, B12 and folate levels Discussed with patient regarding upper endoscopy and he is agreeable to undergo.  If EGD does not identify any source of anemia, discussed about colonoscopy with possible video capsule endoscopy  I have discussed alternative options, risks & benefits,  which include, but are not limited to, bleeding, infection, perforation,respiratory complication & drug reaction.  The patient agrees with this plan & written consent will be obtained.     Thank you  for involving me in the care of this patient.  LOS: 1 day   Lannette Donath, MD  01/13/2023, 12:05 PM    Note: This dictation was prepared with Dragon dictation along with smaller phrase technology. Any transcriptional errors that result from this process are unintentional.

## 2023-01-13 NOTE — Transfer of Care (Signed)
Immediate Anesthesia Transfer of Care Note  Patient: Jack Powell  Procedure(s) Performed: ESOPHAGOGASTRODUODENOSCOPY (EGD) HOT HEMOSTASIS (ARGON PLASMA COAGULATION/BICAP) ESOPHAGEAL BANDING  Patient Location: Endoscopy Unit  Anesthesia Type:MAC  Level of Consciousness: alert , oriented, and drowsy  Airway & Oxygen Therapy: Patient Spontanous Breathing and Patient connected to nasal cannula oxygen  Post-op Assessment: Report given to RN, Post -op Vital signs reviewed and stable, and Patient moving all extremities X 4  Post vital signs: Reviewed and stable  Last Vitals:  Vitals Value Taken Time  BP 160/70 01/13/23 1250  Temp    Pulse 69 01/13/23 1251  Resp 16 01/13/23 1251  SpO2 98 % 01/13/23 1251  Vitals shown include unfiled device data.  Last Pain:  Vitals:   01/13/23 1134  TempSrc: Temporal  PainSc: 0-No pain         Complications: No notable events documented.

## 2023-01-13 NOTE — Progress Notes (Signed)
PROGRESS NOTE    Jack Powell  UYQ:[id number] DOB: Sep 07, 1950 DOA: 01/12/2023 PCP: Danella Penton, MD    Brief Narrative:  72 year old male with history of hypertension, GERD, anxiety, who presents to the emergency department for chief concerns of black, loose tarry stools. He reports 5-6 tarry, watery bowel movements today. Over the last two days, he has been having sporadic tarry dark bowel movements. This is a new symptom for patient.   He denies vomiting of blood and/or coffee ground emesis.   Patient endorses taking iron supplements for many years. He reports that he does not have black bowel movements regularly while taking iron supplements.   He endorses history of variceal bleeding.    9/5: Hemoglobin 9.4.  Trended down.  No bleeding noted overnight.  Remains hemodynamically stable.  Seen by GI.  Plan for EGD today.   Assessment & Plan:   Principal Problem:   Upper GI bleed Active Problems:   Wilson's disease   Liver cirrhosis (HCC)   Essential hypertension   Pancytopenia (HCC)   Thrombocytopenia (HCC)   * Upper GI bleed History of esophageal varices Patient experienced 2 days of dark tarry stools associated with weakness Plan: Continue PPI gtt. Continue octreotide gtt. Continue empiric ceftriaxone Monitor and transfuse as needed hemoglobin less than 8 Further recommendations post endoscopy   Thrombocytopenia (HCC) Patient's platelet range is 63-94 this year Slightly lower than baseline No indication for transfusion.  Continue to monitor   Pancytopenia (HCC) Continue to monitor, currently within baseline range   Essential hypertension As needed IV hydralazine  Liver cirrhosis (HCC) Secondary to Wilson's disease   Wilson's disease Continue to monitor and follow-up with outpatient hepatologist as appropriate  DVT prophylaxis: SCDs Code Status: Full Family Communication: Wife via phone 9/5 Disposition Plan: Status is: Inpatient Remains  inpatient appropriate because: Upper GI bleed   Level of care: Progressive  Consultants:  GI  Procedures:  EGD  Antimicrobials: Ceftriaxone   Subjective: Seen and examined.  Sitting comfortably in bed.  No visible distress  Objective: Vitals:   01/13/23 0800 01/13/23 0841 01/13/23 1000 01/13/23 1134  BP: 137/60 135/68 134/61 139/62  Pulse: 60 65 62 (!) 59  Resp: 16 16 17 16   Temp:  98 F (36.7 C)  97.7 F (36.5 C)  TempSrc:  Oral  Temporal  SpO2: 99% 99% 99% 100%  Weight:    65.3 kg  Height:    6' (1.829 m)   No intake or output data in the 24 hours ending 01/13/23 1138 Filed Weights   01/12/23 1244 01/13/23 1134  Weight: 65.3 kg 65.3 kg    Examination:  General exam: Appears calm and comfortable  Respiratory system: Clear to auscultation. Respiratory effort normal. Cardiovascular system: S1-2, RRR, no murmurs, no pedal edema Gastrointestinal system: Soft, NT/ND, normal bowel sounds Central nervous system: Alert and oriented. No focal neurological deficits. Extremities: Symmetric 5 x 5 power. Skin: No rashes, lesions or ulcers Psychiatry: Judgement and insight appear normal. Mood & affect appropriate.     Data Reviewed: I have personally reviewed following labs and imaging studies  CBC: Recent Labs  Lab 01/12/23 1246 01/12/23 1537 01/13/23 0444  WBC 3.6* 3.7* 2.5*  NEUTROABS  --  2.4  --   HGB 11.2* 10.4* 9.4*  HCT 33.5* 31.7* 27.9*  MCV 106.0* 108.6* 106.5*  PLT 69* 63* 55*   Basic Metabolic Panel: Recent Labs  Lab 01/12/23 1246 01/13/23 0444  NA 141 143  K 3.4* 3.5  CL 106 110  CO2 26 24  GLUCOSE 167* 93  BUN 27* 23  CREATININE 0.60* 0.64  CALCIUM 9.1 7.9*   GFR: Estimated Creatinine Clearance: 80.5 mL/min (by C-G formula based on SCr of 0.64 mg/dL). Liver Function Tests: Recent Labs  Lab 01/12/23 1246  AST 37  ALT 35  ALKPHOS 95  BILITOT 1.6*  PROT 6.1*  ALBUMIN 3.8   No results for input(s): "LIPASE", "AMYLASE" in the  last 168 hours. Recent Labs  Lab 01/12/23 1537  AMMONIA 37*   Coagulation Profile: No results for input(s): "INR", "PROTIME" in the last 168 hours. Cardiac Enzymes: No results for input(s): "CKTOTAL", "CKMB", "CKMBINDEX", "TROPONINI" in the last 168 hours. BNP (last 3 results) No results for input(s): "PROBNP" in the last 8760 hours. HbA1C: No results for input(s): "HGBA1C" in the last 72 hours. CBG: No results for input(s): "GLUCAP" in the last 168 hours. Lipid Profile: No results for input(s): "CHOL", "HDL", "LDLCALC", "TRIG", "CHOLHDL", "LDLDIRECT" in the last 72 hours. Thyroid Function Tests: No results for input(s): "TSH", "T4TOTAL", "FREET4", "T3FREE", "THYROIDAB" in the last 72 hours. Anemia Panel: No results for input(s): "VITAMINB12", "FOLATE", "FERRITIN", "TIBC", "IRON", "RETICCTPCT" in the last 72 hours. Sepsis Labs: No results for input(s): "PROCALCITON", "LATICACIDVEN" in the last 168 hours.  No results found for this or any previous visit (from the past 240 hour(s)).       Radiology Studies: No results found.      Scheduled Meds:  [MAR Hold] cholecalciferol  1,000 Units Oral Daily   [MAR Hold] escitalopram  10 mg Oral Daily   [MAR Hold] ferrous gluconate  324 mg Oral Q breakfast   [MAR Hold] losartan  50 mg Oral Daily   [MAR Hold] magnesium oxide  200 mg Oral Daily   [MAR Hold] multivitamin with minerals  1 tablet Oral Daily   [MAR Hold] nadolol  40 mg Oral Daily   [MAR Hold] pantoprazole  40 mg Intravenous Q12H   [MAR Hold] rifaximin  550 mg Oral BID   [MAR Hold] tamsulosin  0.4 mg Oral QHS   [MAR Hold] triamterene-hydrochlorothiazide  1 tablet Oral Daily   [MAR Hold] Trientine HCl  4 capsule Oral Daily   Continuous Infusions:  sodium chloride     [MAR Hold] cefTRIAXone (ROCEPHIN)  IV Stopped (01/12/23 1905)   octreotide (SANDOSTATIN) 500 mcg in sodium chloride 0.9 % 250 mL (2 mcg/mL) infusion 50 mcg/hr (01/13/23 0546)   pantoprazole 8 mg/hr  (01/13/23 0547)     LOS: 1 day      Tresa Moore, MD [facility]   If 7PM-7AM, please contact night-coverage  01/13/2023, 11:38 AM

## 2023-01-13 NOTE — Anesthesia Preprocedure Evaluation (Signed)
Anesthesia Evaluation  Patient identified by MRN, date of birth, ID band Patient awake    Reviewed: Allergy & Precautions, NPO status , Patient's Chart, lab work & pertinent test results  History of Anesthesia Complications Negative for: history of anesthetic complications  Airway Mallampati: III  TM Distance: <3 FB Neck ROM: full    Dental  (+) Chipped   Pulmonary neg pulmonary ROS, neg shortness of breath   Pulmonary exam normal        Cardiovascular Exercise Tolerance: Good hypertension, (-) angina (-) Past MI Normal cardiovascular exam     Neuro/Psych negative neurological ROS  negative psych ROS   GI/Hepatic Neg liver ROS,GERD  Controlled,,  Endo/Other  diabetes, Type 2    Renal/GU negative Renal ROS  negative genitourinary   Musculoskeletal   Abdominal   Peds  Hematology negative hematology ROS (+)   Anesthesia Other Findings Patient is NPO appropriate and reports no nausea or vomiting today.  Past Medical History: No date: Anemia No date: Cirrhosis (HCC) No date: Diabetes mellitus without complication (HCC) No date: Esophagitis No date: GERD (gastroesophageal reflux disease) No date: History of colon polyps No date: Hypertension No date: Skin cancer 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  Past Surgical History: 02/27/2020: CATARACT EXTRACTION W/PHACO; Right     Comment:  Procedure: CATARACT EXTRACTION PHACO AND INTRAOCULAR               LENS PLACEMENT (IOC) RIGHT DIABETIC 3.78   00:55.4 6.8%;               Surgeon: Lockie Mola, MD;  Location: [facility]               SURGERY CNTR;  Service: Ophthalmology;  Laterality:               Right;  Diabetic - oral meds 03/12/2020: CATARACT EXTRACTION W/PHACO; Left     Comment:  Procedure: CATARACT EXTRACTION PHACO AND INTRAOCULAR               LENS PLACEMENT (IOC) LEFT DIABETIC 1.54 00:29.3 5.2%;                Surgeon: Lockie Mola, MD;  Location: [facility]                SURGERY CNTR;  Service: Ophthalmology;  Laterality: Left;              patient prefers arrival after 10am No date: COLONOSCOPY 05/05/2016: ESOPHAGOGASTRODUODENOSCOPY (EGD) WITH PROPOFOL; N/A     Comment:  Procedure: ESOPHAGOGASTRODUODENOSCOPY (EGD) WITH               PROPOFOL;  Surgeon: Scot Jun, MD;  Location: [facility]              ENDOSCOPY;  Service: Endoscopy;  Laterality: N/A; No date: LIVER BIOPSY No date: SKIN CANCER EXCISION No date: TONSILLECTOMY 2005: UVULOPALATOPHARYNGOPLASTY  BMI    Body Mass Index: 19.53 kg/m      Reproductive/Obstetrics negative OB ROS                             Anesthesia Physical Anesthesia Plan  ASA: 3  Anesthesia Plan: General   Post-op Pain Management:    Induction: Intravenous  PONV Risk Score and Plan: Propofol infusion and TIVA  Airway Management Planned: Natural Airway and Nasal Cannula  Additional Equipment:   Intra-op Plan:   Post-operative Plan:   Informed Consent: I have reviewed the  patients History and Physical, chart, labs and discussed the procedure including the risks, benefits and alternatives for the proposed anesthesia with the patient or authorized representative who has indicated his/her understanding and acceptance.     Dental Advisory Given  Plan Discussed with: Anesthesiologist, CRNA and Surgeon  Anesthesia Plan Comments: (Patient consented for risks of anesthesia including but not limited to:  - adverse reactions to medications - risk of airway placement if required - damage to eyes, teeth, lips or other oral mucosa - nerve damage due to positioning  - sore throat or hoarseness - Damage to heart, brain, nerves, lungs, other parts of body or loss of life  Patient voiced understanding.)       Anesthesia Quick Evaluation

## 2023-01-13 NOTE — Op Note (Signed)
[facility] Gastroenterology Patient Name: Jack Powell Procedure Date: 01/13/2023 11:46 AM MRN: [id number] Account #: [id number] Date of Birth: [birth year] Admit Type: Inpatient Age: 72 Room: [facility] ENDO ROOM 2 Gender: Male Note Status: Finalized Instrument Name: Upper Endoscope [id number] Procedure:             Upper GI endoscopy Indications:           Acute post hemorrhagic anemia, Melena, Suspected upper                         gastrointestinal bleeding, Cirrhosis with UGI bleeding                         suspected esophageal varices Providers:             Toney Reil MD, MD Medicines:             General Anesthesia Complications:         No immediate complications. Estimated blood loss:                         Minimal. Procedure:             Pre-Anesthesia Assessment:                        - Prior to the procedure, a History and Physical was                         performed, and patient medications and allergies were                         reviewed. The patient is competent. The risks and                         benefits of the procedure and the sedation options and                         risks were discussed with the patient. All questions                         were answered and informed consent was obtained.                         Patient identification and proposed procedure were                         verified by the physician, the nurse, the                         anesthesiologist, the anesthetist and the technician                         in the pre-procedure area in the procedure room in the                         endoscopy suite. Mental Status Examination: alert and                         oriented. Airway Examination: normal oropharyngeal  airway and neck mobility. Respiratory Examination:                         clear to auscultation. CV Examination: normal.                         Prophylactic Antibiotics:  The patient does not require                         prophylactic antibiotics. Prior Anticoagulants: The                         patient has taken no anticoagulant or antiplatelet                         agents. ASA Grade Assessment: III - A patient with                         severe systemic disease. After reviewing the risks and                         benefits, the patient was deemed in satisfactory                         condition to undergo the procedure. The anesthesia                         plan was to use general anesthesia. Immediately prior                         to administration of medications, the patient was                         re-assessed for adequacy to receive sedatives. The                         heart rate, respiratory rate, oxygen saturations,                         blood pressure, adequacy of pulmonary ventilation, and                         response to care were monitored throughout the                         procedure. The physical status of the patient was                         re-assessed after the procedure.                        After obtaining informed consent, the endoscope was                         passed under direct vision. Throughout the procedure,                         the patient's blood pressure, pulse, and oxygen  saturations were monitored continuously. The Endoscope                         was introduced through the mouth, and advanced to the                         second part of duodenum. The upper GI endoscopy was                         accomplished without difficulty. The patient tolerated                         the procedure well. Findings:      The duodenal bulb and second portion of the duodenum were normal.      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 Coagulation       for hemostasis using argon plasma  was successful. Estimated blood loss:       none.      Four columns of large (> 5 mm) varices with stigmata of recent bleeding       (clean based ulcer) were found in the middle third of the esophagus, in       the lower third of the esophagus and at the gastroesophageal junction,       40 cm from the incisors. Red wale signs were present. The variceal       column with ulcer started oozing during suctioning the varix, band was       immediately deployed on to the ulcer, and bleeding stopped. Overall,       Three bands were successfully placed with incomplete eradication of       varices. There was no bleeding at the end of the procedure. Impression:            - Normal duodenal bulb and second portion of the                         duodenum.                        - Gastric antral vascular ectasia with bleeding.                         Treated with argon plasma coagulation (APC).                        - Large (> 5 mm) esophageal varices with stigmata of                         recent bleeding. Incompletely eradicated. Banded.                        - No specimens collected. Recommendation:        - Return patient to hospital ward for ongoing care.                        - NPO today except ice chips the liquid diet tomorrow.                        -  Continue present medications.                        - Refer to an interventional radiologist to evaluate                         for emergent TIPS if patient rebleeds from esopageal                         varices.                        - Continue octreotide infusion of 50 micrograms per                         hour for 3 days.                        - Continue PPI drip                        - Continue ceftriaxone for SBP ppx                        - Resume nadolol as outpt                        - [place] with liver doppler Procedure Code(s):     --- Professional ---                        [id number], Esophagogastroduodenoscopy, flexible,                          transoral; with band ligation of esophageal/gastric                         varices                        43255, 59, Esophagogastroduodenoscopy, flexible,                         transoral; with control of bleeding, any method Diagnosis Code(s):     --- Professional ---                        K74.60, Unspecified cirrhosis of liver                        I85.11, Secondary esophageal varices with bleeding                        K31.811, Angiodysplasia of stomach and duodenum with                         bleeding                        D62, Acute posthemorrhagic anemia                        K92.1, Melena (includes Hematochezia) CPT copyright 2022 American Medical Association. All rights reserved. The codes documented in this  report are preliminary and upon coder review may  be revised to meet current compliance requirements. Dr. Libby Maw Toney Reil MD, MD 01/13/2023 12:49:36 PM This report has been signed electronically. Number of Addenda: 0 Note Initiated On: 01/13/2023 11:46 AM Estimated Blood Loss:  Estimated blood loss was minimal.      [facility]

## 2023-01-13 NOTE — ED Notes (Signed)
Patient transported to endo.  

## 2023-01-13 NOTE — Anesthesia Postprocedure Evaluation (Signed)
Anesthesia Post Note  Patient: Jack Powell  Procedure(s) Performed: ESOPHAGOGASTRODUODENOSCOPY (EGD) HOT HEMOSTASIS (ARGON PLASMA COAGULATION/BICAP) ESOPHAGEAL BANDING  Patient location during evaluation: Endoscopy Anesthesia Type: General Level of consciousness: awake and alert Pain management: pain level controlled Vital Signs Assessment: post-procedure vital signs reviewed and stable Respiratory status: spontaneous breathing, nonlabored ventilation, respiratory function stable and patient connected to nasal cannula oxygen Cardiovascular status: blood pressure returned to baseline and stable Postop Assessment: no apparent nausea or vomiting Anesthetic complications: no   No notable events documented.   Last Vitals:  Vitals:   01/13/23 1320 01/13/23 1330  BP: (!) 129/52 (!) 147/68  Pulse: 64 (!) 57  Resp: 14 10  Temp:    SpO2: 98% 99%    Last Pain:  Vitals:   01/13/23 1330  TempSrc:   PainSc: 0-No pain                 Cleda Mccreedy Jack Powell

## 2023-01-13 NOTE — Plan of Care (Signed)
  Problem: Clinical Measurements: Goal: Ability to maintain clinical measurements within normal limits will improve Outcome: Progressing   Problem: Clinical Measurements: Goal: Respiratory complications will improve Outcome: Progressing   Problem: Clinical Measurements: Goal: Cardiovascular complication will be avoided Outcome: Progressing   Problem: Activity: Goal: Risk for activity intolerance will decrease Outcome: Progressing   Problem: Pain Managment: Goal: General experience of comfort will improve Outcome: Progressing   Problem: Safety: Goal: Ability to remain free from injury will improve Outcome: Progressing   

## 2023-01-14 ENCOUNTER — Inpatient Hospital Stay: Payer: Medicare HMO

## 2023-01-14 ENCOUNTER — Encounter: Payer: Self-pay | Admitting: Gastroenterology

## 2023-01-14 DIAGNOSIS — K7460 Unspecified cirrhosis of liver: Secondary | ICD-10-CM | POA: Diagnosis not present

## 2023-01-14 DIAGNOSIS — K31819 Angiodysplasia of stomach and duodenum without bleeding: Secondary | ICD-10-CM

## 2023-01-14 DIAGNOSIS — I8510 Secondary esophageal varices without bleeding: Secondary | ICD-10-CM

## 2023-01-14 DIAGNOSIS — K922 Gastrointestinal hemorrhage, unspecified: Secondary | ICD-10-CM | POA: Diagnosis not present

## 2023-01-14 LAB — CBC WITH DIFFERENTIAL/PLATELET
Abs Immature Granulocytes: 0.02 10*3/uL (ref 0.00–0.07)
Basophils Absolute: 0 10*3/uL (ref 0.0–0.1)
Basophils Relative: 1 %
Eosinophils Absolute: 0.2 10*3/uL (ref 0.0–0.5)
Eosinophils Relative: 6 %
HCT: 28.2 % — ABNORMAL LOW (ref 39.0–52.0)
Hemoglobin: 9.6 g/dL — ABNORMAL LOW (ref 13.0–17.0)
Immature Granulocytes: 1 %
Lymphocytes Relative: 18 %
Lymphs Abs: 0.7 10*3/uL (ref 0.7–4.0)
MCH: 36.1 pg — ABNORMAL HIGH (ref 26.0–34.0)
MCHC: 34 g/dL (ref 30.0–36.0)
MCV: 106 fL — ABNORMAL HIGH (ref 80.0–100.0)
Monocytes Absolute: 0.3 10*3/uL (ref 0.1–1.0)
Monocytes Relative: 8 %
Neutro Abs: 2.4 10*3/uL (ref 1.7–7.7)
Neutrophils Relative %: 66 %
Platelets: 72 10*3/uL — ABNORMAL LOW (ref 150–400)
RBC: 2.66 MIL/uL — ABNORMAL LOW (ref 4.22–5.81)
RDW: 16.7 % — ABNORMAL HIGH (ref 11.5–15.5)
WBC: 3.5 10*3/uL — ABNORMAL LOW (ref 4.0–10.5)
nRBC: 0 % (ref 0.0–0.2)

## 2023-01-14 LAB — PREPARE PLATELET PHERESIS: Unit division: 0

## 2023-01-14 LAB — BASIC METABOLIC PANEL
Anion gap: 7 (ref 5–15)
BUN: 22 mg/dL (ref 8–23)
CO2: 25 mmol/L (ref 22–32)
Calcium: 7.6 mg/dL — ABNORMAL LOW (ref 8.9–10.3)
Chloride: 109 mmol/L (ref 98–111)
Creatinine, Ser: 0.65 mg/dL (ref 0.61–1.24)
GFR, Estimated: >60 mL/min (ref >60)
Glucose, Bld: 136 mg/dL — ABNORMAL HIGH (ref 70–99)
Potassium: 3.5 mmol/L (ref 3.5–5.1)
Sodium: 141 mmol/L (ref 135–145)

## 2023-01-14 LAB — BPAM PLATELET PHERESIS
Blood Product Expiration Date: 202409082359
ISSUE DATE / TIME: 202409051645
Unit Type and Rh: 7300

## 2023-01-14 MED ORDER — ENSURE ENLIVE PO LIQD
237.0000 mL | Freq: Two times a day (BID) | ORAL | Status: DC
  Administered 2023-01-14 – 2023-01-16 (×4): 237 mL via ORAL

## 2023-01-14 NOTE — Progress Notes (Signed)
Arlyss Repress, MD [address]  [address]  [place], [place] [address]  Main: [phone]  Fax: [phone] Pager: [phone]   Subjective: No acute events overnight.  Patient denies any chest pain, nausea or vomiting, melena   Objective: Vital signs in last 24 hours: Vitals:   01/14/23 0346 01/14/23 0730 01/14/23 0813 01/14/23 1228  BP: 133/66  (!) 130/55 (!) 146/64  Pulse: 60  (!) 56 (!) 58  Resp: 16 16 19 20   Temp: 98.5 F (36.9 C)  98.9 F (37.2 C) 98.3 F (36.8 C)  TempSrc: Oral   Oral  SpO2: 94%  93% 90%  Weight:      Height:       Weight change: 0 kg  Intake/Output Summary (Last 24 hours) at 01/14/2023 1259 Last data filed at 01/14/2023 0346 Gross per 24 hour  Intake 1928.64 ml  Output 0 ml  Net 1928.64 ml     Exam: Heart: Regular rate and rhythm, S1S2 present, or without murmur or extra heart sounds Lungs: normal and clear to auscultation Abdomen: soft, nontender, normal bowel sounds  Lab Results:    Latest Ref Rng & Units 01/14/2023    4:29 AM 01/13/2023    8:28 PM 01/13/2023    4:22 PM  CBC  WBC 4.0 - 10.5 K/uL 3.5  3.9  3.0   Hemoglobin 13.0 - 17.0 g/dL 9.6  9.9  42.5   Hematocrit 39.0 - 52.0 % 28.2  29.4  31.1   Platelets 150 - 400 K/uL 72  71  63       Latest Ref Rng & Units 01/14/2023    4:29 AM 01/13/2023    4:44 AM 01/12/2023   12:46 PM  CMP  Glucose 70 - 99 mg/dL 956  93  387   BUN 8 - 23 mg/dL 22  23  27    Creatinine 0.61 - 1.24 mg/dL 5.64  3.32  9.51   Sodium 135 - 145 mmol/L 141  143  141   Potassium 3.5 - 5.1 mmol/L 3.5  3.5  3.4   Chloride 98 - 111 mmol/L 109  110  106   CO2 22 - 32 mmol/L 25  24  26    Calcium 8.9 - 10.3 mg/dL 7.6  7.9  9.1   Total Protein 6.5 - 8.1 g/dL   6.1   Total Bilirubin 0.3 - 1.2 mg/dL   1.6   Alkaline Phos 38 - 126 U/L   95   AST 15 - 41 U/L   37   ALT 0 - 44 U/L   35     Micro Results: No results found for this or any previous visit (from the past 240 hour(s)). Studies/Results: No results  found. Medications: Skews me Scheduled Meds:  sodium chloride   Intravenous Once   cholecalciferol  1,000 Units Oral Daily   escitalopram  10 mg Oral Daily   feeding supplement  237 mL Oral BID BM   ferrous gluconate  324 mg Oral Q breakfast   losartan  50 mg Oral Daily   magnesium oxide  200 mg Oral Daily   multivitamin with minerals  1 tablet Oral Daily   nadolol  40 mg Oral Daily   [START ON 01/16/2023] pantoprazole  40 mg Intravenous Q12H   rifaximin  550 mg Oral BID   tamsulosin  0.4 mg Oral QHS   triamterene-hydrochlorothiazide  1 tablet Oral Daily   Trientine HCl  4 capsule Oral Daily  Continuous Infusions:  cefTRIAXone (ROCEPHIN)  IV Stopped (01/13/23 2300)   octreotide (SANDOSTATIN) 500 mcg in sodium chloride 0.9 % 250 mL (2 mcg/mL) infusion 50 mcg/hr (01/14/23 0310)   pantoprazole 8 mg/hr (01/14/23 0309)   PRN Meds:.acetaminophen **OR** acetaminophen, ALPRAZolam, hydrALAZINE, ondansetron **OR** ondansetron (ZOFRAN) IV, senna-docusate   Assessment: Principal Problem:   Upper GI bleed Active Problems:   Wilson's disease   Liver cirrhosis (HCC)   Essential hypertension   Pancytopenia (HCC)   Thrombocytopenia (HCC)   Acute upper GI bleed   Secondary esophageal varices with bleeding (HCC)   GAVE (gastric antral vascular ectasia)  Jack Powell is a 72 y.o. male with Wilson's disease, cirrhosis of liver with esophageal varices presented with melena, acute on chronic symptomatic anemia status post EGD on 9/5 which revealed severe GAVE as well as large esophageal varices s/p APC and variceal ligation  Plan: Acute upper GI bleed secondary to GAVE and esophageal varices Bleeding has currently subsided BUN/creatinine ratio is improving Hemoglobin has been stable within last 24 hours Start full liquid diet today, advance to low-sodium diet tomorrow if he continues to remain stable with no recurrence of GI bleed Continue octreotide drip for 72 hours total, last day  tomorrow Switch to Protonix 40 mg p.o. twice daily before meals Continue ceftriaxone for 72 hours total for SBP prophylaxis Restart nadolol 40 mg daily at the time of discharge Patient will need repeat EGD for follow-up of varices and to assess the need for repeat banding in 4 to 6 weeks Follow-up with his primary hepatologist, Dr. Carrie Mew at [facility] Ultrasound liver Doppler, awaiting results  Cirrhosis of liver secondary to Wilson's disease, MELD-Na 12 Decompensated secondary to variceal bleed, hepatic encephalopathy Patient is currently euvolemic Continue Xifaxan and lactulose for hepatic encephalopathy Ultrasound liver Doppler is pending Management of varices as above Follow-up with Dr. Carrie Mew upon discharge   LOS: 2 days   Jack Powell 01/14/2023, 12:59 PM

## 2023-01-14 NOTE — Care Management Important Message (Signed)
Important Message  Patient Details  Name: Jack Powell MRN: [id number] Date of Birth: [birth year]   Medicare Important Message Given:  Yes     Johnell Comings 01/14/2023, 10:18 AM

## 2023-01-14 NOTE — Progress Notes (Signed)
PROGRESS NOTE    Jack Powell  ZOX:[id number] DOB: January 14, 1951 DOA: 01/12/2023 PCP: Danella Penton, MD    Brief Narrative:  72 year old male with history of hypertension, GERD, anxiety, who presents to the emergency department for chief concerns of black, loose tarry stools. He reports 5-6 tarry, watery bowel movements today. Over the last two days, he has been having sporadic tarry dark bowel movements. This is a new symptom for patient.   He denies vomiting of blood and/or coffee ground emesis.   Patient endorses taking iron supplements for many years. He reports that he does not have black bowel movements regularly while taking iron supplements.   He endorses history of variceal bleeding.    9/5: Hemoglobin 9.4.  Trended down.  No bleeding noted overnight.  Remains hemodynamically stable.  Seen by GI.  Plan for EGD today.  9/6: Patient stable.  Hemoglobin stable overnight.  No bowel movements.  No hematemesis.   Assessment & Plan:   Principal Problem:   Upper GI bleed Active Problems:   Wilson's disease   Liver cirrhosis (HCC)   Essential hypertension   Pancytopenia (HCC)   Thrombocytopenia (HCC)   Acute upper GI bleed   Secondary esophageal varices with bleeding (HCC)   GAVE (gastric antral vascular ectasia)   Esophageal varices in cirrhosis (HCC)   * Upper GI bleed History of esophageal varices Patient experienced 2 days of dark tarry stools associated with weakness Status post EGD, esophageal varices and gastric antral vascular ectasia noted Status post banding and APC Plan: DC IV PPI Start p.o. Protonix 40 mg twice daily Continue octreotide drip for 72 hours total last day tomorrow Continue Rocephin for 72 hours total Nadolol 40 mg daily at time of discharge Repeat EGD in 4 to 6 weeks Follow-up ultrasound liver Doppler Okay for full liquid diet today, advance to regular diet tomorrow if stable  Thrombocytopenia (HCC) Patient's platelet range is 63-94  this year Will lower the baseline at 55 on 9/5.  Considering evidence of bleeding on EGD decision made to transfuse 1 unit platelets   Pancytopenia (HCC) Continue to monitor, currently within baseline range   Essential hypertension As needed IV hydralazine  Liver cirrhosis (HCC) Secondary to Wilson's disease. Outpatient follow-up with hepatologist at [facility]   Wilson's disease Continue to monitor and follow-up with outpatient hepatologist as appropriate  DVT prophylaxis: SCDs Code Status: Full Family Communication: Wife via phone 9/5, bedside 9/6 Disposition Plan: Status is: Inpatient Remains inpatient appropriate because: Upper GI bleed   Level of care: Progressive  Consultants:  GI  Procedures:  EGD  Antimicrobials: Ceftriaxone   Subjective: Seen and examined.  Lying in bed.  No visible distress.  No complaints of pain.  Objective: Vitals:   01/14/23 0346 01/14/23 0730 01/14/23 0813 01/14/23 1228  BP: 133/66  (!) 130/55 (!) 146/64  Pulse: 60  (!) 56 (!) 58  Resp: 16 16 19 20   Temp: 98.5 F (36.9 C)  98.9 F (37.2 C) 98.3 F (36.8 C)  TempSrc: Oral   Oral  SpO2: 94%  93% 90%  Weight:      Height:        Intake/Output Summary (Last 24 hours) at 01/14/2023 1405 Last data filed at 01/14/2023 0346 Gross per 24 hour  Intake 1928.64 ml  Output 0 ml  Net 1928.64 ml   Filed Weights   01/12/23 1244 01/13/23 1134  Weight: 65.3 kg 65.3 kg    Examination:  General exam: No acute  distress Respiratory system: Lungs clear.  No work of breathing.  Room air Cardiovascular system: S1-2, RRR, no murmurs, no pedal edema Gastrointestinal system: Soft, NT/ND, normal bowel sounds Central nervous system: Alert and oriented x 3, no focal deficits Extremities: Symmetric 5 x 5 power. Skin: No rashes, lesions or ulcers Psychiatry: Judgement and insight appear normal. Mood & affect appropriate.     Data Reviewed: I have personally reviewed following labs and  imaging studies  CBC: Recent Labs  Lab 01/12/23 1537 01/13/23 0444 01/13/23 1622 01/13/23 2028 01/14/23 0429  WBC 3.7* 2.5* 3.0* 3.9* 3.5*  NEUTROABS 2.4  --   --   --  2.4  HGB 10.4* 9.4* 10.4* 9.9* 9.6*  HCT 31.7* 27.9* 31.1* 29.4* 28.2*  MCV 108.6* 106.5* 106.9* 105.0* 106.0*  PLT 63* 55* 63* 71* 72*   Basic Metabolic Panel: Recent Labs  Lab 01/12/23 1246 01/13/23 0444 01/14/23 0429  NA 141 143 141  K 3.4* 3.5 3.5  CL 106 110 109  CO2 26 24 25   GLUCOSE 167* 93 136*  BUN 27* 23 22  CREATININE 0.60* 0.64 0.65  CALCIUM 9.1 7.9* 7.6*   GFR: Estimated Creatinine Clearance: 80.5 mL/min (by C-G formula based on SCr of 0.65 mg/dL). Liver Function Tests: Recent Labs  Lab 01/12/23 1246  AST 37  ALT 35  ALKPHOS 95  BILITOT 1.6*  PROT 6.1*  ALBUMIN 3.8   No results for input(s): "LIPASE", "AMYLASE" in the last 168 hours. Recent Labs  Lab 01/12/23 1537  AMMONIA 37*   Coagulation Profile: Recent Labs  Lab 01/13/23 1504  INR 1.4*   Cardiac Enzymes: No results for input(s): "CKTOTAL", "CKMB", "CKMBINDEX", "TROPONINI" in the last 168 hours. BNP (last 3 results) No results for input(s): "PROBNP" in the last 8760 hours. HbA1C: No results for input(s): "HGBA1C" in the last 72 hours. CBG: No results for input(s): "GLUCAP" in the last 168 hours. Lipid Profile: No results for input(s): "CHOL", "HDL", "LDLCALC", "TRIG", "CHOLHDL", "LDLDIRECT" in the last 72 hours. Thyroid Function Tests: No results for input(s): "TSH", "T4TOTAL", "FREET4", "T3FREE", "THYROIDAB" in the last 72 hours. Anemia Panel: Recent Labs    01/13/23 0444 01/13/23 1504  VITAMINB12  --  995*  FOLATE 20.7  --   FERRITIN 79  --   TIBC 300  --   IRON 108  --    Sepsis Labs: No results for input(s): "PROCALCITON", "LATICACIDVEN" in the last 168 hours.  No results found for this or any previous visit (from the past 240 hour(s)).       Radiology Studies: No results  found.      Scheduled Meds:  sodium chloride   Intravenous Once   cholecalciferol  1,000 Units Oral Daily   escitalopram  10 mg Oral Daily   feeding supplement  237 mL Oral BID BM   ferrous gluconate  324 mg Oral Q breakfast   losartan  50 mg Oral Daily   magnesium oxide  200 mg Oral Daily   multivitamin with minerals  1 tablet Oral Daily   nadolol  40 mg Oral Daily   [START ON 01/16/2023] pantoprazole  40 mg Intravenous Q12H   rifaximin  550 mg Oral BID   tamsulosin  0.4 mg Oral QHS   triamterene-hydrochlorothiazide  1 tablet Oral Daily   Trientine HCl  4 capsule Oral Daily   Continuous Infusions:  cefTRIAXone (ROCEPHIN)  IV Stopped (01/13/23 2300)   octreotide (SANDOSTATIN) 500 mcg in sodium chloride 0.9 % 250 mL (  2 mcg/mL) infusion 50 mcg/hr (01/14/23 0310)   pantoprazole 8 mg/hr (01/14/23 1343)     LOS: 2 days      Tresa Moore, MD [facility]   If 7PM-7AM, please contact night-coverage  01/14/2023, 2:05 PM

## 2023-01-14 NOTE — Progress Notes (Signed)
Transition of Care [facility]) - Inpatient Brief Assessment   Patient Details  Name: Jack Powell MRN: [id number] Date of Birth: [birth year]  Transition of Care [facility]) CM/SW Contact:    Truddie Hidden, RN Phone Number: 01/14/2023, 10:55 AM   Clinical Narrative: Transition of Care Department [facility]) has reviewed patient and no TOC needs have been identified at this time. We will continue to monitor patient advancement. If new patient transition needs arise, please place a TOC consult.       Transition of Care Asessment: Insurance and Status: Insurance coverage has been reviewed Patient has primary care physician: Yes Home environment has been reviewed: Home Prior level of function:: Independent Prior/Current Home Services: No current home services Social Determinants of Health Reivew: SDOH reviewed no interventions necessary Readmission risk has been reviewed: Yes Transition of care needs: no transition of care needs at this time

## 2023-01-15 DIAGNOSIS — K7460 Unspecified cirrhosis of liver: Secondary | ICD-10-CM | POA: Diagnosis not present

## 2023-01-15 DIAGNOSIS — K31819 Angiodysplasia of stomach and duodenum without bleeding: Secondary | ICD-10-CM | POA: Diagnosis not present

## 2023-01-15 DIAGNOSIS — K922 Gastrointestinal hemorrhage, unspecified: Secondary | ICD-10-CM | POA: Diagnosis not present

## 2023-01-15 LAB — BASIC METABOLIC PANEL
Anion gap: 8 (ref 5–15)
BUN: 17 mg/dL (ref 8–23)
CO2: 25 mmol/L (ref 22–32)
Calcium: 7.4 mg/dL — ABNORMAL LOW (ref 8.9–10.3)
Chloride: 104 mmol/L (ref 98–111)
Creatinine, Ser: 0.69 mg/dL (ref 0.61–1.24)
GFR, Estimated: >60 mL/min (ref >60)
Glucose, Bld: 234 mg/dL — ABNORMAL HIGH (ref 70–99)
Potassium: 4 mmol/L (ref 3.5–5.1)
Sodium: 137 mmol/L (ref 135–145)

## 2023-01-15 LAB — CBC WITH DIFFERENTIAL/PLATELET
Abs Immature Granulocytes: 0.04 10*3/uL (ref 0.00–0.07)
Basophils Absolute: 0 10*3/uL (ref 0.0–0.1)
Basophils Relative: 1 %
Eosinophils Absolute: 0.1 10*3/uL (ref 0.0–0.5)
Eosinophils Relative: 4 %
HCT: 28.3 % — ABNORMAL LOW (ref 39.0–52.0)
Hemoglobin: 9.5 g/dL — ABNORMAL LOW (ref 13.0–17.0)
Immature Granulocytes: 1 %
Lymphocytes Relative: 15 %
Lymphs Abs: 0.6 10*3/uL — ABNORMAL LOW (ref 0.7–4.0)
MCH: 35.3 pg — ABNORMAL HIGH (ref 26.0–34.0)
MCHC: 33.6 g/dL (ref 30.0–36.0)
MCV: 105.2 fL — ABNORMAL HIGH (ref 80.0–100.0)
Monocytes Absolute: 0.3 10*3/uL (ref 0.1–1.0)
Monocytes Relative: 8 %
Neutro Abs: 2.7 10*3/uL (ref 1.7–7.7)
Neutrophils Relative %: 71 %
Platelets: 75 10*3/uL — ABNORMAL LOW (ref 150–400)
RBC: 2.69 MIL/uL — ABNORMAL LOW (ref 4.22–5.81)
RDW: 16.3 % — ABNORMAL HIGH (ref 11.5–15.5)
WBC: 3.7 10*3/uL — ABNORMAL LOW (ref 4.0–10.5)
nRBC: 0 % (ref 0.0–0.2)

## 2023-01-15 MED ORDER — PANTOPRAZOLE SODIUM 40 MG PO TBEC
40.0000 mg | DELAYED_RELEASE_TABLET | Freq: Two times a day (BID) | ORAL | Status: DC
  Administered 2023-01-15 – 2023-01-16 (×3): 40 mg via ORAL
  Filled 2023-01-15 (×3): qty 1

## 2023-01-15 MED ORDER — PANTOPRAZOLE SODIUM 40 MG PO TBEC: 40.0000 mg | DELAYED_RELEASE_TABLET | Freq: Two times a day (BID) | ORAL | 1 refills | Status: DC

## 2023-01-15 NOTE — Discharge Summary (Signed)
Physician Discharge Summary  Jack Powell XLK:[id number] DOB: [birth year] DOA: 01/12/2023  PCP: Danella Penton, MD  Admit date: 01/12/2023 Discharge date: 01/16/2023  Admitted From: Home Disposition:  Home  Recommendations for Outpatient Follow-up:  Follow up with PCP in 1-2 weeks Follow up with GI as directed  Home Health:No  Equipment/Devices:None   Discharge Condition:Stable  CODE STATUS:FULL  Diet recommendation: Soft/bland  Brief/Interim Summary:  72 year old male with history of hypertension, GERD, anxiety, who presents to the emergency department for chief concerns of black, loose tarry stools. He reports 5-6 tarry, watery bowel movements today. Over the last two days, he has been having sporadic tarry dark bowel movements. This is a new symptom for patient.   He denies vomiting of blood and/or coffee ground emesis.   Patient endorses taking iron supplements for many years. He reports that he does not have black bowel movements regularly while taking iron supplements.   He endorses history of variceal bleeding.    9/5: Hemoglobin 9.4.  Trended down.  No bleeding noted overnight.  Remains hemodynamically stable.  Seen by GI.  Plan for EGD today.   9/6: Patient stable.  Hemoglobin stable overnight.  No bowel movements.  No hematemesis.   9/7: Hb stable.  Ok for discharge home.  FU PCP 1 week.  FU GI as directed 9/8: Could not leave due to octreotide finishing late.  Discharge this AM    Discharge Diagnoses:  Principal Problem:   Upper GI bleed Active Problems:   Wilson's disease   Liver cirrhosis (HCC)   Essential hypertension   Pancytopenia (HCC)   Thrombocytopenia (HCC)   Acute upper GI bleed   Secondary esophageal varices with bleeding (HCC)   GAVE (gastric antral vascular ectasia)   Esophageal varices in cirrhosis (HCC)   Upper GI bleed History of esophageal varices Patient experienced 2 days of dark tarry stools associated with weakness Status post  EGD, esophageal varices and gastric antral vascular ectasia noted Status post banding and APC Plan: DC home PPI PO BID  Continue nadolol FU OP PCP 1 week FU OP GI 4-6 wks Soft/bland diet  Thrombocytopenia (HCC) Patient's platelet range is 63-94 this year Stable at time of dc   Pancytopenia (HCC) Continue to monitor, currently within baseline range   Essential hypertension As needed IV hydralazine   Liver cirrhosis (HCC) Secondary to Wilson's disease. Outpatient follow-up with hepatologist at [facility] Can resume home nadolol, lactulose, rifaximin   Wilson's disease Continue to monitor and follow-up with outpatient hepatologist as appropriate  Discharge Instructions  Discharge Instructions     Diet - low sodium heart healthy   Complete by: As directed    Increase activity slowly   Complete by: As directed       Allergies as of 01/15/2023       Reactions   Ace Inhibitors Cough   Serotonin Other (See Comments)   hallucinations   Penicillins Rash        Medication List     STOP taking these medications    glimepiride 2 MG tablet Commonly known as: AMARYL   losartan 50 MG tablet Commonly known as: COZAAR       TAKE these medications    ALPRAZolam 0.25 MG tablet Commonly known as: XANAX Take 0.25 mg by mouth at bedtime as needed for anxiety.   cholecalciferol 1000 units tablet Commonly known as: VITAMIN D Take 1,000 Units by mouth daily.   escitalopram 10 MG tablet Commonly known as: LEXAPRO  Take 10 mg by mouth daily.   ferrous gluconate 324 MG tablet Commonly known as: FERGON Take 324 mg by mouth daily with breakfast.   Jardiance 25 MG Tabs tablet Generic drug: empagliflozin Take 25 mg by mouth daily.   ketoconazole 2 % shampoo Commonly known as: NIZORAL Apply 1 Application topically every other day.   lactulose (encephalopathy) 10 GM/15ML Soln Commonly known as: CHRONULAC Take 30 g by mouth 3 (three) times daily.    Magnesium 250 MG Tabs Take by mouth daily.   MULTIPLE VITAMIN PO Take 1 tablet by mouth daily.   nadolol 40 MG tablet Commonly known as: CORGARD Take 40 mg by mouth daily.   pantoprazole 40 MG tablet Commonly known as: PROTONIX Take 1 tablet (40 mg total) by mouth 2 (two) times daily before a meal. What changed: when to take this   Syprine 250 MG Caps Generic drug: Trientine HCl Take 4 capsules by mouth daily.   tamsulosin 0.4 MG Caps capsule Commonly known as: FLOMAX 0.4 mg daily.   triamterene-hydrochlorothiazide 37.5-25 MG tablet Commonly known as: MAXZIDE-25 Take 1 tablet by mouth daily.   Xifaxan 550 MG Tabs tablet Generic drug: rifaximin Take 550 mg by mouth 2 (two) times daily.        Allergies  Allergen Reactions   Ace Inhibitors Cough   Serotonin Other (See Comments)    hallucinations   Penicillins Rash    Consultations: GI   Procedures/Studies: US LIVER DOPPLER  Result Date: 01/14/2023 CLINICAL DATA:  Cirrhosis EXAM: DUPLEX ULTRASOUND OF LIVER TECHNIQUE: Color and duplex Doppler ultrasound was performed to evaluate the hepatic in-flow and out-flow vessels. COMPARISON:  Ultrasound abdomen limited 10/17/2020 FINDINGS: Liver: Diffusely coarsened.  Hepatic contours are nodular. No focal lesion, mass or intrahepatic biliary ductal dilatation. Main Portal Vein size: 1.6 cm Portal Vein Velocities Main Prox:  53 cm/sec Main Mid: 52 cm/sec Main Dist:  45 cm/sec Right: 43 cm/sec Left: 65 cm/sec Hepatic Vein Velocities Right:  21 cm/sec Middle:  25 cm/sec Left:  21 cm/sec IVC: Present and patent with normal respiratory phasicity. Hepatic Artery Velocity:  100 cm/sec Splenic Vein Velocity:  20 cm/sec Spleen: 19.9 cm x 15.7 cm x 6.2 cm with a total volume of 1,009 cm^3 (411 cm^3 is upper limit normal) Portal Vein Occlusion/Thrombus: No Splenic Vein Occlusion/Thrombus: No Ascites: Trace perihepatic ascites. Varices: None Stones and sludge noted within the gallbladder  lumen without additional evidence of acute cholecystitis. IMPRESSION: 1. Patent portal vein with appropriate direction of flow. 2. Minimal perihepatic ascites and splenomegaly consistent with portal hypertension. 3. Cirrhotic liver morphology without focal hepatic lesion. Electronically Signed   By: Acquanetta Belling M.D.   On: 01/14/2023 15:13      Subjective: Seen and examined on day of discharge.  Stable, no distress.  Tolerating soft diet.  Appropriate for dc home  Discharge Exam: Vitals:   01/15/23 0334 01/15/23 0821  BP: (!) 98/47 (!) 107/52  Pulse: (!) 55 (!) 51  Resp:  17  Temp:  98 F (36.7 C)  SpO2:  93%   Vitals:   01/15/23 0019 01/15/23 0331 01/15/23 0334 01/15/23 0821  BP: (!) 121/58 (!) 101/45 (!) 98/47 (!) 107/52  Pulse: (!) 55 (!) 55 (!) 55 (!) 51  Resp: 18 18  17   Temp: 98.4 F (36.9 C) 98 F (36.7 C)  98 F (36.7 C)  TempSrc: Oral Oral  Oral  SpO2: 97% 92%  93%  Weight:      Height:  General: Pt is alert, awake, not in acute distress Cardiovascular: RRR, S1/S2 +, no rubs, no gallops Respiratory: CTA bilaterally, no wheezing, no rhonchi Abdominal: Soft, NT, ND, bowel sounds + Extremities: no edema, no cyanosis    The results of significant diagnostics from this hospitalization (including imaging, microbiology, ancillary and laboratory) are listed below for reference.     Microbiology: No results found for this or any previous visit (from the past 240 hour(s)).   Labs: BNP (last 3 results) No results for input(s): "BNP" in the last 8760 hours. Basic Metabolic Panel: Recent Labs  Lab 01/12/23 1246 01/13/23 0444 01/14/23 0429 01/15/23 0945  NA 141 143 141 137  K 3.4* 3.5 3.5 4.0  CL 106 110 109 104  CO2 26 24 25 25   GLUCOSE 167* 93 136* 234*  BUN 27* 23 22 17   CREATININE 0.60* 0.64 0.65 0.69  CALCIUM 9.1 7.9* 7.6* 7.4*   Liver Function Tests: Recent Labs  Lab 01/12/23 1246  AST 37  ALT 35  ALKPHOS 95  BILITOT 1.6*  PROT 6.1*   ALBUMIN 3.8   No results for input(s): "LIPASE", "AMYLASE" in the last 168 hours. Recent Labs  Lab 01/12/23 1537  AMMONIA 37*   CBC: Recent Labs  Lab 01/12/23 1537 01/13/23 0444 01/13/23 1622 01/13/23 2028 01/14/23 0429 01/15/23 0945  WBC 3.7* 2.5* 3.0* 3.9* 3.5* 3.7*  NEUTROABS 2.4  --   --   --  2.4 2.7  HGB 10.4* 9.4* 10.4* 9.9* 9.6* 9.5*  HCT 31.7* 27.9* 31.1* 29.4* 28.2* 28.3*  MCV 108.6* 106.5* 106.9* 105.0* 106.0* 105.2*  PLT 63* 55* 63* 71* 72* 75*   Cardiac Enzymes: No results for input(s): "CKTOTAL", "CKMB", "CKMBINDEX", "TROPONINI" in the last 168 hours. BNP: Invalid input(s): "POCBNP" CBG: No results for input(s): "GLUCAP" in the last 168 hours. D-Dimer No results for input(s): "DDIMER" in the last 72 hours. Hgb A1c No results for input(s): "HGBA1C" in the last 72 hours. Lipid Profile No results for input(s): "CHOL", "HDL", "LDLCALC", "TRIG", "CHOLHDL", "LDLDIRECT" in the last 72 hours. Thyroid function studies No results for input(s): "TSH", "T4TOTAL", "T3FREE", "THYROIDAB" in the last 72 hours.  Invalid input(s): "FREET3" Anemia work up Recent Labs    01/13/23 0444 01/13/23 1504  VITAMINB12  --  995*  FOLATE 20.7  --   FERRITIN 79  --   TIBC 300  --   IRON 108  --    Urinalysis No results found for: "COLORURINE", "APPEARANCEUR", "LABSPEC", "PHURINE", "GLUCOSEU", "HGBUR", "BILIRUBINUR", "KETONESUR", "PROTEINUR", "UROBILINOGEN", "NITRITE", "LEUKOCYTESUR" Sepsis Labs Recent Labs  Lab 01/13/23 1622 01/13/23 2028 01/14/23 0429 01/15/23 0945  WBC 3.0* 3.9* 3.5* 3.7*   Microbiology No results found for this or any previous visit (from the past 240 hour(s)).   Time coordinating discharge: Over 30 minutes  SIGNED:   Tresa Moore, MD  [facility] 01/16/23 Pager   If 7PM-7AM, please contact night-coverage

## 2023-01-15 NOTE — Progress Notes (Addendum)
Jack Repress, MD [address]  [address]  [place], [place] [address]  Main: [phone]  Fax: [phone] Pager: [phone]   Subjective: No acute events overnight.  Patient denies any chest pain, nausea or vomiting, melena.  He reports having an episode of dark stool today.   Objective: Vital signs in last 24 hours: Vitals:   01/15/23 0019 01/15/23 0331 01/15/23 0334 01/15/23 0821  BP: (!) 121/58 (!) 101/45 (!) 98/47 (!) 107/52  Pulse: (!) 55 (!) 55 (!) 55 (!) 51  Resp: 18 18  17   Temp: 98.4 F (36.9 C) 98 F (36.7 C)  98 F (36.7 C)  TempSrc: Oral Oral  Oral  SpO2: 97% 92%  93%  Weight:      Height:       Weight change:   Intake/Output Summary (Last 24 hours) at 01/15/2023 1709 Last data filed at 01/15/2023 0800 Gross per 24 hour  Intake 1091.85 ml  Output --  Net 1091.85 ml     Exam: Heart: Regular rate and rhythm, S1S2 present, or without murmur or extra heart sounds Lungs: normal and clear to auscultation Abdomen: soft, nontender, normal bowel sounds  Lab Results:    Latest Ref Rng & Units 01/15/2023    9:45 AM 01/14/2023    4:29 AM 01/13/2023    8:28 PM  CBC  WBC 4.0 - 10.5 K/uL 3.7  3.5  3.9   Hemoglobin 13.0 - 17.0 g/dL 9.5  9.6  9.9   Hematocrit 39.0 - 52.0 % 28.3  28.2  29.4   Platelets 150 - 400 K/uL 75  72  71       Latest Ref Rng & Units 01/15/2023    9:45 AM 01/14/2023    4:29 AM 01/13/2023    4:44 AM  CMP  Glucose 70 - 99 mg/dL 347  425  93   BUN 8 - 23 mg/dL 17  22  23    Creatinine 0.61 - 1.24 mg/dL 9.56  3.87  5.64   Sodium 135 - 145 mmol/L 137  141  143   Potassium 3.5 - 5.1 mmol/L 4.0  3.5  3.5   Chloride 98 - 111 mmol/L 104  109  110   CO2 22 - 32 mmol/L 25  25  24    Calcium 8.9 - 10.3 mg/dL 7.4  7.6  7.9     Micro Results: No results found for this or any previous visit (from the past 240 hour(s)). Studies/Results: US LIVER DOPPLER  Result Date: 01/14/2023 CLINICAL DATA:  Cirrhosis EXAM: DUPLEX ULTRASOUND OF LIVER  TECHNIQUE: Color and duplex Doppler ultrasound was performed to evaluate the hepatic in-flow and out-flow vessels. COMPARISON:  Ultrasound abdomen limited 10/17/2020 FINDINGS: Liver: Diffusely coarsened.  Hepatic contours are nodular. No focal lesion, mass or intrahepatic biliary ductal dilatation. Main Portal Vein size: 1.6 cm Portal Vein Velocities Main Prox:  53 cm/sec Main Mid: 52 cm/sec Main Dist:  45 cm/sec Right: 43 cm/sec Left: 65 cm/sec Hepatic Vein Velocities Right:  21 cm/sec Middle:  25 cm/sec Left:  21 cm/sec IVC: Present and patent with normal respiratory phasicity. Hepatic Artery Velocity:  100 cm/sec Splenic Vein Velocity:  20 cm/sec Spleen: 19.9 cm x 15.7 cm x 6.2 cm with a total volume of 1,009 cm^3 (411 cm^3 is upper limit normal) Portal Vein Occlusion/Thrombus: No Splenic Vein Occlusion/Thrombus: No Ascites: Trace perihepatic ascites. Varices: None Stones and sludge noted within the gallbladder lumen without additional evidence of acute cholecystitis. IMPRESSION:  1. Patent portal vein with appropriate direction of flow. 2. Minimal perihepatic ascites and splenomegaly consistent with portal hypertension. 3. Cirrhotic liver morphology without focal hepatic lesion. Electronically Signed   By: Acquanetta Belling M.D.   On: 01/14/2023 15:13   Medications: Skews me Scheduled Meds:  sodium chloride   Intravenous Once   cholecalciferol  1,000 Units Oral Daily   escitalopram  10 mg Oral Daily   feeding supplement  237 mL Oral BID BM   ferrous gluconate  324 mg Oral Q breakfast   losartan  50 mg Oral Daily   magnesium oxide  200 mg Oral Daily   multivitamin with minerals  1 tablet Oral Daily   nadolol  40 mg Oral Daily   pantoprazole  40 mg Oral BID   rifaximin  550 mg Oral BID   tamsulosin  0.4 mg Oral QHS   triamterene-hydrochlorothiazide  1 tablet Oral Daily   Trientine HCl  4 capsule Oral Daily   Continuous Infusions:  cefTRIAXone (ROCEPHIN)  IV Stopped (01/14/23 1811)   octreotide  (SANDOSTATIN) 500 mcg in sodium chloride 0.9 % 250 mL (2 mcg/mL) infusion 50 mcg/hr (01/15/23 1233)   PRN Meds:.acetaminophen **OR** acetaminophen, ALPRAZolam, hydrALAZINE, ondansetron **OR** ondansetron (ZOFRAN) IV, senna-docusate   Assessment: Principal Problem:   Upper GI bleed Active Problems:   Wilson's disease   Liver cirrhosis (HCC)   Essential hypertension   Pancytopenia (HCC)   Thrombocytopenia (HCC)   Acute upper GI bleed   Secondary esophageal varices with bleeding (HCC)   GAVE (gastric antral vascular ectasia)   Esophageal varices in cirrhosis (HCC)  Jack Powell is a 72 y.o. male with Wilson's disease, cirrhosis of liver with esophageal varices presented with melena, acute on chronic symptomatic anemia status post EGD on 9/5 which revealed severe GAVE as well as large esophageal varices s/p APC and variceal ligation  Plan: Acute upper GI bleed secondary to GAVE and esophageal varices Bleeding has currently subsided BUN/creatinine ratio normalized Hemoglobin has been stable since upper endoscopy Advance to low-sodium diet  Continue octreotide drip for 72 hours total, last day today Switch to Protonix 40 mg p.o. twice daily before meals Continue ceftriaxone for 72 hours total for SBP prophylaxis Restart nadolol 40 mg daily at the time of discharge Patient will need repeat EGD for follow-up of varices and to assess the need for repeat banding in 4 to 6 weeks Follow-up with his primary hepatologist, Dr. Carrie Mew at [facility] Ultrasound liver Doppler, awaiting results  Cirrhosis of liver secondary to Wilson's disease, MELD-Na 12 Decompensated secondary to variceal bleed, hepatic encephalopathy Patient is currently euvolemic Continue Xifaxan and lactulose for hepatic encephalopathy Ultrasound liver Doppler with no evidence of portal vein thrombosis Management of varices as above Follow-up with Dr. Carrie Mew upon discharge   LOS: 3 days    Jack Powell 01/15/2023, 5:09 PM

## 2023-01-15 NOTE — Progress Notes (Signed)
       CROSS COVER NOTE  NAME: AMDREW MCROBIE MRN: [id number] DOB : [birth year]    Concern as stated by nurse / staff   This pt has D/c orders but because his gtt doesn't end till late, his wife had to leave, so he will not be able to be d/c'd till am.      Pertinent findings on chart review: Patient admitted with upper GI bleed and hx of esophageal varices. Per plan in DC summary Status post EGD, esophageal varices and gastric antral vascular ectasia noted Status post banding and APC Plan: DC IV PPI Start p.o. Protonix 40 mg twice daily Continue octreotide drip for 72 hours total last day tomorrow Continue Rocephin for 72 hours total Nadolol 40 mg daily at time of discharge Repeat EGD in 4 to 6 weeks Follow-up ultrasound liver Doppler Okay for full liquid diet today, advance to regular diet tomorrow if stable  Assessment and  Interventions   Assessment:    01/15/2023    9:00 PM 01/15/2023    8:21 AM 01/15/2023    3:34 AM  Vitals with BMI  Systolic 117 107 98  Diastolic 63 52 47  Pulse 54 51 55    Plan: Wait for am when octreotide infusion has been completed for discharge        Donnie Mesa NP [facility] Cross Cover 7pm-7am - check amion for availability Pager [phone]

## 2023-01-15 NOTE — Plan of Care (Signed)

## 2023-01-16 DIAGNOSIS — K922 Gastrointestinal hemorrhage, unspecified: Secondary | ICD-10-CM | POA: Diagnosis not present

## 2023-01-16 NOTE — Progress Notes (Signed)
Patient is ready for discharge. I will review discharge instructions with patient and his wife. IV's have been removed without complication. Patient has all of his belonging and will be transported home by his wife. He will pick up his prescriptions at his pharmacy in [place] . He is stable, Alert and Oriented and all of his questions regarding discharge have been answered.

## 2023-01-17 NOTE — Group Note (Deleted)

## 2023-01-18 DIAGNOSIS — K922 Gastrointestinal hemorrhage, unspecified: Secondary | ICD-10-CM | POA: Diagnosis not present

## 2023-01-18 DIAGNOSIS — I8500 Esophageal varices without bleeding: Secondary | ICD-10-CM | POA: Diagnosis not present

## 2023-01-18 DIAGNOSIS — I864 Gastric varices: Secondary | ICD-10-CM | POA: Diagnosis not present

## 2023-01-21 ENCOUNTER — Telehealth: Payer: Self-pay

## 2023-01-21 ENCOUNTER — Other Ambulatory Visit: Payer: Self-pay

## 2023-01-21 DIAGNOSIS — K7469 Other cirrhosis of liver: Secondary | ICD-10-CM | POA: Diagnosis not present

## 2023-01-21 DIAGNOSIS — I8510 Secondary esophageal varices without bleeding: Secondary | ICD-10-CM

## 2023-01-21 NOTE — Telephone Encounter (Signed)
Called [facility] and was on hold for 16 minutes and then they told me they would get kendal so I could let her know they transfer me to her. She took a message and informed her of this information and informed her we would do the EGD for Patient. She states she will let the provider know. Called patient and went over instructions mailed them and sent to [facility].

## 2023-01-21 NOTE — Telephone Encounter (Signed)
Patient provider Andrena Mews from [facility] is calling because he saw patient today for a hospital follow up. 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 He states the patient told him today in the visit that you said when he was in the hospital at [facility] for a Acute GI bleed from 01/12/2023 to 01/16/2023 that you would do his repeat EGD locally so he would not have to drive to [place] for them. His last EGD was 01/13/2023. Please advise if this is correct and when he is due for his repeat EGD

## 2023-01-21 NOTE — Telephone Encounter (Signed)
That is correct, we did have a discussion about giving him an option of doing an upper endoscopy locally for variceal surveillance after his posthospital follow-up visit with Dr. Carrie Mew as long as both patient and Dr. Carrie Mew are agreeable.  He will be due for his next upper endoscopy in 4 to 6 weeks from his last EGD  Lannette Donath, MD

## 2023-01-25 DIAGNOSIS — R197 Diarrhea, unspecified: Secondary | ICD-10-CM | POA: Diagnosis not present

## 2023-01-25 DIAGNOSIS — K7469 Other cirrhosis of liver: Secondary | ICD-10-CM | POA: Diagnosis not present

## 2023-01-26 DIAGNOSIS — L570 Actinic keratosis: Secondary | ICD-10-CM | POA: Diagnosis not present

## 2023-01-26 DIAGNOSIS — D485 Neoplasm of uncertain behavior of skin: Secondary | ICD-10-CM | POA: Diagnosis not present

## 2023-01-26 DIAGNOSIS — L743 Miliaria, unspecified: Secondary | ICD-10-CM | POA: Diagnosis not present

## 2023-01-26 DIAGNOSIS — L821 Other seborrheic keratosis: Secondary | ICD-10-CM | POA: Diagnosis not present

## 2023-01-26 DIAGNOSIS — R208 Other disturbances of skin sensation: Secondary | ICD-10-CM | POA: Diagnosis not present

## 2023-01-26 DIAGNOSIS — D225 Melanocytic nevi of trunk: Secondary | ICD-10-CM | POA: Diagnosis not present

## 2023-01-26 DIAGNOSIS — X32XXXA Exposure to sunlight, initial encounter: Secondary | ICD-10-CM | POA: Diagnosis not present

## 2023-01-26 DIAGNOSIS — Z85828 Personal history of other malignant neoplasm of skin: Secondary | ICD-10-CM | POA: Diagnosis not present

## 2023-01-26 DIAGNOSIS — Z08 Encounter for follow-up examination after completed treatment for malignant neoplasm: Secondary | ICD-10-CM | POA: Diagnosis not present

## 2023-01-27 DIAGNOSIS — I8501 Esophageal varices with bleeding: Secondary | ICD-10-CM | POA: Diagnosis not present

## 2023-01-27 DIAGNOSIS — D509 Iron deficiency anemia, unspecified: Secondary | ICD-10-CM | POA: Diagnosis not present

## 2023-02-23 ENCOUNTER — Encounter: Payer: Self-pay | Admitting: Gastroenterology

## 2023-02-24 ENCOUNTER — Other Ambulatory Visit: Payer: Self-pay

## 2023-02-24 ENCOUNTER — Ambulatory Visit: Payer: Medicare HMO | Admitting: Anesthesiology

## 2023-02-24 ENCOUNTER — Encounter
Admission: RE | Disposition: A | Discharge status: Home / Self Care | Payer: Self-pay | Source: Home / Self Care | Attending: Gastroenterology

## 2023-02-24 ENCOUNTER — Encounter: Payer: Self-pay | Admitting: Gastroenterology

## 2023-02-24 ENCOUNTER — Ambulatory Visit
Admission: RE | Admit: 2023-02-24 | Discharge: 2023-02-24 | Disposition: A | Discharge status: Home / Self Care | Payer: Medicare HMO | Attending: Gastroenterology | Admitting: Gastroenterology

## 2023-02-24 DIAGNOSIS — G4730 Sleep apnea, unspecified: Secondary | ICD-10-CM | POA: Insufficient documentation

## 2023-02-24 DIAGNOSIS — K2950 Unspecified chronic gastritis without bleeding: Secondary | ICD-10-CM | POA: Diagnosis not present

## 2023-02-24 DIAGNOSIS — K2970 Gastritis, unspecified, without bleeding: Secondary | ICD-10-CM | POA: Diagnosis not present

## 2023-02-24 DIAGNOSIS — K766 Portal hypertension: Secondary | ICD-10-CM | POA: Diagnosis not present

## 2023-02-24 DIAGNOSIS — K31811 Angiodysplasia of stomach and duodenum with bleeding: Secondary | ICD-10-CM | POA: Diagnosis not present

## 2023-02-24 DIAGNOSIS — K3189 Other diseases of stomach and duodenum: Secondary | ICD-10-CM | POA: Insufficient documentation

## 2023-02-24 DIAGNOSIS — E119 Type 2 diabetes mellitus without complications: Secondary | ICD-10-CM | POA: Insufficient documentation

## 2023-02-24 DIAGNOSIS — Z7984 Long term (current) use of oral hypoglycemic drugs: Secondary | ICD-10-CM | POA: Insufficient documentation

## 2023-02-24 DIAGNOSIS — I10 Essential (primary) hypertension: Secondary | ICD-10-CM | POA: Diagnosis not present

## 2023-02-24 DIAGNOSIS — I8510 Secondary esophageal varices without bleeding: Secondary | ICD-10-CM | POA: Diagnosis not present

## 2023-02-24 DIAGNOSIS — I8500 Esophageal varices without bleeding: Secondary | ICD-10-CM | POA: Diagnosis not present

## 2023-02-24 DIAGNOSIS — Z85828 Personal history of other malignant neoplasm of skin: Secondary | ICD-10-CM | POA: Insufficient documentation

## 2023-02-24 DIAGNOSIS — Z860100 Personal history of colon polyps, unspecified: Secondary | ICD-10-CM | POA: Diagnosis not present

## 2023-02-24 DIAGNOSIS — K31819 Angiodysplasia of stomach and duodenum without bleeding: Secondary | ICD-10-CM | POA: Diagnosis not present

## 2023-02-24 DIAGNOSIS — K319 Disease of stomach and duodenum, unspecified: Secondary | ICD-10-CM

## 2023-02-24 HISTORY — PX: HOT HEMOSTASIS: SHX5433

## 2023-02-24 HISTORY — DX: Sleep apnea, unspecified: G47.30

## 2023-02-24 HISTORY — PX: ESOPHAGEAL BANDING: SHX5518

## 2023-02-24 HISTORY — PX: ESOPHAGOGASTRODUODENOSCOPY (EGD) WITH PROPOFOL: SHX5813

## 2023-02-24 SURGERY — ESOPHAGOGASTRODUODENOSCOPY (EGD) WITH PROPOFOL
Anesthesia: General

## 2023-02-24 MED ORDER — LIDOCAINE HCL (CARDIAC) PF 100 MG/5ML IV SOSY
PREFILLED_SYRINGE | INTRAVENOUS | Status: DC | PRN
  Administered 2023-02-24: 40 mg via INTRAVENOUS

## 2023-02-24 MED ORDER — LIDOCAINE HCL (PF) 2 % IJ SOLN
INTRAMUSCULAR | Status: AC
  Filled 2023-02-24: qty 5

## 2023-02-24 MED ORDER — PROPOFOL 500 MG/50ML IV EMUL
INTRAVENOUS | Status: DC | PRN
  Administered 2023-02-24: 180 ug/kg/min via INTRAVENOUS

## 2023-02-24 MED ORDER — SODIUM CHLORIDE 0.9 % IV SOLN: INTRAVENOUS | Status: DC

## 2023-02-24 MED ORDER — PROPOFOL 10 MG/ML IV BOLUS
INTRAVENOUS | Status: AC
  Filled 2023-02-24: qty 40

## 2023-02-24 MED ORDER — PROPOFOL 10 MG/ML IV BOLUS
INTRAVENOUS | Status: DC | PRN
  Administered 2023-02-24: 100 mg via INTRAVENOUS
  Administered 2023-02-24: 50 mg via INTRAVENOUS

## 2023-02-24 NOTE — Transfer of Care (Signed)
Immediate Anesthesia Transfer of Care Note  Patient: Jack Powell  Procedure(s) Performed: ESOPHAGOGASTRODUODENOSCOPY (EGD) WITH PROPOFOL HOT HEMOSTASIS (ARGON PLASMA COAGULATION/BICAP) ESOPHAGEAL BANDING  Patient Location: PACU  Anesthesia Type:MAC  Level of Consciousness: drowsy  Airway & Oxygen Therapy: Patient Spontanous Breathing  Post-op Assessment: Report given to RN and Post -op Vital signs reviewed and stable  Post vital signs: Reviewed and stable  Last Vitals:  Vitals Value Taken Time  BP 104/42 02/24/23 1007  Temp    Pulse 64 02/24/23 1008  Resp 10 02/24/23 1008  SpO2 99 % 02/24/23 1008  Vitals shown include unfiled device data.  Last Pain:  Vitals:   02/24/23 0845  TempSrc: Temporal  PainSc: 0-No pain         Complications: No notable events documented.

## 2023-02-24 NOTE — Anesthesia Postprocedure Evaluation (Signed)
Anesthesia Post Note  Patient: Jack Powell  Procedure(s) Performed: ESOPHAGOGASTRODUODENOSCOPY (EGD) WITH PROPOFOL HOT HEMOSTASIS (ARGON PLASMA COAGULATION/BICAP) ESOPHAGEAL BANDING  Patient location during evaluation: PACU Anesthesia Type: General Level of consciousness: awake Pain management: pain level controlled Vital Signs Assessment: post-procedure vital signs reviewed and stable Respiratory status: spontaneous breathing and nonlabored ventilation Cardiovascular status: blood pressure returned to baseline Anesthetic complications: no   No notable events documented.   Last Vitals:  Vitals:   02/24/23 0845  BP: (!) 144/53  Pulse: (!) 58  Resp: 16  Temp: (!) 35.9 C  SpO2: 100%    Last Pain:  Vitals:   02/24/23 0845  TempSrc: Temporal  PainSc: 0-No pain                 VAN STAVEREN,Zaden Sako

## 2023-02-24 NOTE — Anesthesia Preprocedure Evaluation (Signed)
Anesthesia Evaluation  Patient identified by MRN, date of birth, ID band Patient awake    Reviewed: Allergy & Precautions, NPO status , Patient's Chart, lab work & pertinent test results  Airway Mallampati: III  TM Distance: <3 FB Neck ROM: full  Mouth opening: Pediatric Airway  Dental  (+) Teeth Intact   Pulmonary neg pulmonary ROS   Pulmonary exam normal breath sounds clear to auscultation       Cardiovascular Exercise Tolerance: Good hypertension, Pt. on medications negative cardio ROS Normal cardiovascular exam Rhythm:Regular Rate:Normal     Neuro/Psych negative neurological ROS  negative psych ROS   GI/Hepatic negative GI ROS, Neg liver ROS,GERD  Medicated,,(+) Cirrhosis         Endo/Other  negative endocrine ROSdiabetes, Type 2, Oral Hypoglycemic Agents    Renal/GU negative Renal ROS  negative genitourinary   Musculoskeletal   Abdominal  (+) + obese  Peds negative pediatric ROS (+)  Hematology negative hematology ROS (+) Blood dyscrasia   Anesthesia Other Findings Past Medical History: No date: Anemia No date: Cirrhosis (HCC) No date: Diabetes mellitus without complication (HCC) No date: Esophagitis No date: GERD (gastroesophageal reflux disease) No date: History of colon polyps No date: Hypertension No date: Skin cancer 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  Past Surgical History: 02/27/2020: CATARACT EXTRACTION W/PHACO; Right     Comment:  Procedure: CATARACT EXTRACTION PHACO AND INTRAOCULAR               LENS PLACEMENT (IOC) RIGHT DIABETIC 3.78   00:55.4 6.8%;               Surgeon: Lockie Mola, MD;  Location: [facility]               SURGERY CNTR;  Service: Ophthalmology;  Laterality:               Right;  Diabetic - oral meds 03/12/2020: CATARACT EXTRACTION W/PHACO; Left     Comment:  Procedure: CATARACT EXTRACTION PHACO AND INTRAOCULAR               LENS PLACEMENT (IOC) LEFT DIABETIC 1.54 00:29.3  5.2%;                Surgeon: Lockie Mola, MD;  Location: [facility]               SURGERY CN[facility];  Service: Ophthalmology;  Laterality: Left;              patient prefers arrival after 10am No date: COLONOSCOPY 01/13/2023: ESOPHAGEAL BANDING     Comment:  Procedure: ESOPHAGEAL BANDING;  Surgeon: Toney Reil, MD;  Location: [facility] ENDOSCOPY;  Service:               Gastroenterology;; 01/13/2023: ESOPHAGOGASTRODUODENOSCOPY; N/A     Comment:  Procedure: ESOPHAGOGASTRODUODENOSCOPY (EGD);  Surgeon:               Toney Reil, MD;  Location: [facility] ENDOSCOPY;                Service: Gastroenterology;  Laterality: N/A; 05/05/2016: ESOPHAGOGASTRODUODENOSCOPY (EGD) WITH PROPOFOL; N/A     Comment:  Procedure: ESOPHAGOGASTRODUODENOSCOPY (EGD) WITH               PROPOFOL;  Surgeon: Scot Jun, MD;  Location: [facility]              ENDOSCOPY;  Service: Endoscopy;  Laterality: N/A; 01/13/2023: HOT  HEMOSTASIS     Comment:  Procedure: HOT HEMOSTASIS (ARGON PLASMA               COAGULATION/BICAP);  Surgeon: Toney Reil, MD;                Location: [facility] ENDOSCOPY;  Service: Gastroenterology;; No date: LIVER BIOPSY No date: SKIN CANCER EXCISION No date: TONSILLECTOMY 2005: UVULOPALATOPHARYNGOPLASTY     Reproductive/Obstetrics negative OB ROS                             Anesthesia Physical Anesthesia Plan  ASA: 3  Anesthesia Plan: General   Post-op Pain Management:    Induction: Intravenous  PONV Risk Score and Plan: Propofol infusion and TIVA  Airway Management Planned: Natural Airway and Nasal Cannula  Additional Equipment:   Intra-op Plan:   Post-operative Plan:   Informed Consent: I have reviewed the patients History and Physical, chart, labs and discussed the procedure including the risks, benefits and alternatives for the proposed anesthesia with the patient or authorized representative who has indicated his/her  understanding and acceptance.     Dental Advisory Given  Plan Discussed with: Anesthesiologist, CRNA and Surgeon  Anesthesia Plan Comments:        Anesthesia Quick Evaluation

## 2023-02-24 NOTE — Op Note (Signed)
[facility] Gastroenterology Patient Name: Jack Powell Procedure Date: 02/24/2023 9:32 AM MRN: [id number] Account #: [id number] Date of Birth: 07/22/1950 Admit Type: Outpatient Age: 72 Room: [facility] ENDO ROOM 2 Gender: Male Note Status: Finalized Instrument Name: Upper Endoscope [id number] Procedure:             Upper GI endoscopy Indications:           Esophageal varices, Follow-up of esophageal varices,                         For therapy of esophageal varices Providers:             Toney Reil MD, MD Referring MD:          Danella Penton, MD (Referring MD) Medicines:             General Anesthesia Complications:         No immediate complications. Estimated blood loss: None. Procedure:             Pre-Anesthesia Assessment:                        - Prior to the procedure, a History and Physical was                         performed, and patient medications and allergies were                         reviewed. The patient is competent. The risks and                         benefits of the procedure and the sedation options and                         risks were discussed with the patient. All questions                         were answered and informed consent was obtained.                         Patient identification and proposed procedure were                         verified by the physician, the nurse, the                         anesthesiologist, the anesthetist and the technician                         in the pre-procedure area in the procedure room in the                         endoscopy suite. Mental Status Examination: alert and                         oriented. Airway Examination: normal oropharyngeal                         airway and neck mobility. Respiratory Examination:  clear to auscultation. CV Examination: normal.                         Prophylactic Antibiotics: The patient does not require                          prophylactic antibiotics. Prior Anticoagulants: The                         patient has taken no anticoagulant or antiplatelet                         agents. ASA Grade Assessment: III - A patient with                         severe systemic disease. After reviewing the risks and                         benefits, the patient was deemed in satisfactory                         condition to undergo the procedure. The anesthesia                         plan was to use general anesthesia. Immediately prior                         to administration of medications, the patient was                         re-assessed for adequacy to receive sedatives. The                         heart rate, respiratory rate, oxygen saturations,                         blood pressure, adequacy of pulmonary ventilation, and                         response to care were monitored throughout the                         procedure. The physical status of the patient was                         re-assessed after the procedure.                        After obtaining informed consent, the endoscope was                         passed under direct vision. Throughout the procedure,                         the patient's blood pressure, pulse, and oxygen                         saturations were monitored continuously. The Endoscope  was introduced through the mouth, and advanced to the                         second part of duodenum. The upper GI endoscopy was                         accomplished without difficulty. The patient tolerated                         the procedure well. Findings:      The duodenal bulb and second portion of the duodenum were normal.      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 Coagulation for hemostasis using argon       plasma was successful. Estimated blood loss: none.      Diffuse mildly erythematous  mucosa without bleeding was found in the       entire examined stomach. Biopsies were taken with a cold forceps for       Helicobacter pylori testing.      Mild, diffuse portal hypertensive gastropathy was found in the entire       examined stomach.      One column of large (> 5 mm) varices with no bleeding and no stigmata of       recent bleeding were found in the mid esophagus and in the distal       esophagus. Red wale signs were present. Scarring from prior treatment       was visible. Evidence of partial eradication was visible. The varices       appeared smaller than they were at prior exam. One band was successfully       placed with incomplete eradication of varices. There was no bleeding       during and at the end of the procedure. Impression:            - Normal duodenal bulb and second portion of the                         duodenum.                        - Gastric antral vascular ectasia with bleeding.                         Treated with argon plasma coagulation (APC).                        - Erythematous mucosa in the stomach. Biopsied.                        - Portal hypertensive gastropathy.                        - Large (> 5 mm) esophageal varices with no bleeding                         and no stigmata of recent bleeding. Incompletely                         eradicated. Banded. Recommendation:        -  Discharge patient to home (with spouse).                        - Low sodium diet indefinitely.                        - Continue present medications.                        - Repeat upper endoscopy in 8 weeks for endoscopic                         band ligation.                        - Return to liver clinic as previously scheduled. Procedure Code(s):     --- Professional ---                        [phone], Esophagogastroduodenoscopy, flexible,                         transoral; with band ligation of esophageal/gastric                         varices                         [id number], 59, Esophagogastroduodenoscopy, flexible,                         transoral; with control of bleeding, any method                        43239, Esophagogastroduodenoscopy, flexible,                         transoral; with biopsy, single or multiple Diagnosis Code(s):     --- Professional ---                        K31.811, Angiodysplasia of stomach and duodenum with                         bleeding                        K31.89, Other diseases of stomach and duodenum                        K76.6, Portal hypertension                        I85.00, Esophageal varices without bleeding CPT copyright 2022 American Medical Association. All rights reserved. The codes documented in this report are preliminary and upon coder review may  be revised to meet current compliance requirements. Dr. Libby Maw Toney Reil MD, MD 02/24/2023 10:05:42 AM This report has been signed electronically. Number of Addenda: 0 Note Initiated On: 02/24/2023 9:32 AM Estimated Blood Loss:  Estimated blood loss: none.      [facility]

## 2023-02-24 NOTE — Anesthesia Procedure Notes (Signed)
Procedure Name: MAC Date/Time: 02/24/2023 9:33 AM  Performed by: Elisabeth Pigeon, CRNAPre-anesthesia Checklist: Patient identified, Emergency Drugs available, Suction available, Patient being monitored and Timeout performed Patient Re-evaluated:Patient Re-evaluated prior to induction Oxygen Delivery Method: Simple face mask

## 2023-02-24 NOTE — H&P (Signed)
Arlyss Repress, MD [address]  [address]  [place], [place] [address]  Main: [phone]  Fax: [phone] Pager: [phone]  Primary Care Physician:  Danella Penton, MD Primary Gastroenterologist:  Dr. Arlyss Repress  Pre-Procedure History & Physical: HPI:  Jack Powell is a 72 y.o. male is here for an endoscopy.   Past Medical History:  Diagnosis Date   Anemia    Cirrhosis (HCC)    Diabetes mellitus without complication (HCC)    Esophagitis    GERD (gastroesophageal reflux disease)    History of colon polyps    Hypertension    Skin cancer    Sleep apnea    Wilson disease     Past Surgical History:  Procedure Laterality Date   CATARACT EXTRACTION W/PHACO Right 02/27/2020   Procedure: CATARACT EXTRACTION PHACO AND INTRAOCULAR LENS PLACEMENT (IOC) RIGHT DIABETIC 3.78   00:55.4 6.8%;  Surgeon: Lockie Mola, MD;  Location: [facility] SURGERY [facility];  Service: Ophthalmology;  Laterality: Right;  Diabetic - oral meds   CATARACT EXTRACTION W/PHACO Left 03/12/2020   Procedure: CATARACT EXTRACTION PHACO AND INTRAOCULAR LENS PLACEMENT (IOC) LEFT DIABETIC 1.54 00:29.3 5.2%;  Surgeon: Lockie Mola, MD;  Location: [facility]ERY [facility];  Service: Ophthalmology;  Laterality: Left;  patient prefers arrival after 10am   COLONOSCOPY     ESOPHAGEAL BANDING  01/13/2023   Procedure: ESOPHAGEAL BANDING;  Surgeon: Toney Reil, MD;  Location: [facility] ENDOSCOPY;  Service: Gastroenterology;;   ESOPHAGOGASTRODUODENOSCOPY N/A 01/13/2023   Procedure: ESOPHAGOGASTRODUODENOSCOPY (EGD);  Surgeon: Toney Reil, MD;  Location: [facility] ENDOSCOPY;  Service: Gastroenterology;  Laterality: N/A;   ESOPHAGOGASTRODUODENOSCOPY (EGD) WITH PROPOFOL N/A 05/05/2016   Procedure: ESOPHAGOGASTRODUODENOSCOPY (EGD) WITH PROPOFOL;  Surgeon: Scot Jun, MD;  Location: [facility] ENDOSCOPY;  Service: Endoscopy;  Laterality: N/A;   HOT HEMOSTASIS  01/13/2023   Procedure: HOT HEMOSTASIS (ARGON  PLASMA COAGULATION/BICAP);  Surgeon: Toney Reil, MD;  Location: [facility] ENDOSCOPY;  Service: Gastroenterology;;   LIVER BIOPSY     SKIN CANCER EXCISION     TONSILLECTOMY     UVULOPALATOPHARYNGOPLASTY  2005    Prior to Admission medications   Medication Sig Start Date End Date Taking? Authorizing Provider  carvedilol (COREG) 6.25 MG tablet Take 6.25 mg by mouth 2 (two) times daily with a meal.   Yes [provider]  cholecalciferol (VITAMIN D) 1000 units tablet Take 1,000 Units by mouth daily.   Yes [provider]  donepezil (ARICEPT) 5 MG tablet Take 5 mg by mouth at bedtime.   Yes [provider]  ferrous gluconate (FERGON) 324 MG tablet Take 324 mg by mouth daily with breakfast. 09/02/21 07/15/23 Yes [provider]  Magnesium 250 MG TABS Take by mouth daily.   Yes [provider]  MULTIPLE VITAMIN PO Take 1 tablet by mouth daily.    Yes [provider]  pantoprazole (PROTONIX) 40 MG tablet Take 1 tablet (40 mg total) by mouth 2 (two) times daily before a meal. 01/15/23 03/16/23 Yes Sreenath, Sudheer B, MD  rifaximin (XIFAXAN) 550 MG TABS tablet Take 550 mg by mouth 2 (two) times daily.   Yes [provider]  tamsulosin (FLOMAX) 0.4 MG CAPS capsule 0.4 mg daily. 05/11/22  Yes [provider]  triamterene-hydrochlorothiazide (MAXZIDE-25) 37.5-25 MG tablet Take 1 tablet by mouth daily.   Yes [provider]  Trientine HCl (SYPRINE) 250 MG CAPS Take 4 capsules by mouth daily.    Yes [provider]  ALPRAZolam Prudy Feeler) 0.25  MG tablet Take 0.25 mg by mouth at bedtime as needed for anxiety. Patient not taking: Reported on 01/12/2023    [provider]  empagliflozin (JARDIANCE) 25 MG TABS tablet Take 25 mg by mouth daily.    [provider]  escitalopram (LEXAPRO) 10 MG tablet Take 10 mg by mouth daily. Patient not taking: Reported on 02/24/2023    [provider]  ketoconazole  (NIZORAL) 2 % shampoo Apply 1 Application topically every other day. 06/28/22   [provider]  lactulose, encephalopathy, (CHRONULAC) 10 GM/15ML SOLN Take 30 g by mouth 3 (three) times daily.    [provider]  nadolol (CORGARD) 40 MG tablet Take 40 mg by mouth daily. Patient not taking: Reported on 02/24/2023    [provider]    Allergies as of 01/21/2023 - Review Complete 01/13/2023  Allergen Reaction Noted   Ace inhibitors Cough 10/08/2016   Serotonin Other (See Comments) 10/31/2017   Penicillins Rash 03/15/2016    Family History  Problem Relation Age of Onset   Diabetes Mother    Heart disease Mother    Cancer Father        colon    Social History   Socioeconomic History   Marital status: Married    Spouse name: Not on file   Number of children: Not on file   Years of education: Not on file   Highest education level: Not on file  Occupational History   Not on file  Tobacco Use   Smoking status: Never   Smokeless tobacco: Never  Vaping Use   Vaping status: Never Used  Substance and Sexual Activity   Alcohol use: No   Drug use: No   Sexual activity: Not Currently  Other Topics Concern   Not on file  Social History Narrative   Not on file   Social Determinants of Health   Financial Resource Strain: Low Risk  (12/29/2022)   Received from [facility]   Overall Financial Resource Strain (CARDIA)    Difficulty of Paying Living Expenses: Not hard at all  Food Insecurity: No Food Insecurity (01/13/2023)   Hunger Vital Sign    Worried About Running Out of Food in the Last Year: Never true    Ran Out of Food in the Last Year: Never true  Transportation Needs: No Transportation Needs (01/13/2023)   PRAPARE - Administrator, Civil Service (Medical): No    Lack of Transportation (Non-Medical): No  Physical Activity: Insufficiently Active (11/23/2022)   Received from [facility]   Exercise Vital Sign    Days of  Exercise per Week: 2 days    Minutes of Exercise per Session: 20 min  Stress: No Stress Concern Present (11/23/2022)   Received from [facility] - Occupational Stress Questionnaire    Feeling of Stress : Not at all  Social Connections: Socially Integrated (11/23/2022)   Received from [facility]   Social Network    How would you rate your social network (family, work, friends)?: Good participation with social networks  Intimate Partner Violence: Not At Risk (01/13/2023)   Humiliation, Afraid, Rape, and Kick questionnaire    Fear of Current or Ex-Partner: No    Emotionally Abused: No    Physically Abused: No    Sexually Abused: No    Review of Systems: See HPI, otherwise negative ROS  Physical Exam: BP (!) 144/53   Pulse (!) 58   Temp (!)  96.6 F (35.9 C) (Temporal)   Resp 16   Ht 6' (1.829 m)   Wt 66.6 kg   SpO2 100%   BMI 19.91 kg/m  General:   Alert,  pleasant and cooperative in NAD Head:  Normocephalic and atraumatic. Neck:  Supple; no masses or thyromegaly. Lungs:  Clear throughout to auscultation.    Heart:  Regular rate and rhythm. Abdomen:  Soft, nontender and nondistended. Normal bowel sounds, without guarding, and without rebound.   Neurologic:  Alert and  oriented x4;  grossly normal neurologically.  Impression/Plan: Jack Powell is here for an endoscopy to be performed for variceal surveillance  Risks, benefits, limitations, and alternatives regarding  endoscopy have been reviewed with the patient.  Questions have been answered.  All parties agreeable.   Lannette Donath, MD  02/24/2023, 8:54 AM

## 2023-02-25 ENCOUNTER — Encounter: Payer: Self-pay | Admitting: Gastroenterology

## 2023-02-25 LAB — SURGICAL PATHOLOGY

## 2023-02-27 NOTE — Plan of Care (Signed)
[facility] Tonsillectomy/Adenoidectomy, Postoperative PEDS care plan entered in error.

## 2023-02-28 ENCOUNTER — Encounter: Payer: Self-pay | Admitting: Gastroenterology

## 2023-02-28 ENCOUNTER — Telehealth: Payer: Self-pay

## 2023-02-28 ENCOUNTER — Other Ambulatory Visit: Payer: Self-pay

## 2023-02-28 DIAGNOSIS — I8510 Secondary esophageal varices without bleeding: Secondary | ICD-10-CM

## 2023-02-28 NOTE — Telephone Encounter (Signed)
Per EGD report patient needed to repeat EGD In 8 weeks to check for healing. Last EGD was 02/24/2023

## 2023-02-28 NOTE — Telephone Encounter (Signed)
Called and got patient schedule for repeat EGD for 04/21/2023. Went over instructions mailed them and sent to [employer]

## 2023-04-04 DIAGNOSIS — M7741 Metatarsalgia, right foot: Secondary | ICD-10-CM | POA: Diagnosis not present

## 2023-04-04 DIAGNOSIS — L600 Ingrowing nail: Secondary | ICD-10-CM | POA: Diagnosis not present

## 2023-04-04 DIAGNOSIS — L601 Onycholysis: Secondary | ICD-10-CM | POA: Diagnosis not present

## 2023-04-04 DIAGNOSIS — M7742 Metatarsalgia, left foot: Secondary | ICD-10-CM | POA: Diagnosis not present

## 2023-04-04 DIAGNOSIS — L603 Nail dystrophy: Secondary | ICD-10-CM | POA: Diagnosis not present

## 2023-04-06 DIAGNOSIS — K7460 Unspecified cirrhosis of liver: Secondary | ICD-10-CM | POA: Diagnosis not present

## 2023-04-06 DIAGNOSIS — F32A Depression, unspecified: Secondary | ICD-10-CM | POA: Diagnosis not present

## 2023-04-06 DIAGNOSIS — R5383 Other fatigue: Secondary | ICD-10-CM | POA: Diagnosis not present

## 2023-04-13 DIAGNOSIS — K862 Cyst of pancreas: Secondary | ICD-10-CM | POA: Diagnosis not present

## 2023-04-13 DIAGNOSIS — E119 Type 2 diabetes mellitus without complications: Secondary | ICD-10-CM | POA: Diagnosis not present

## 2023-04-13 DIAGNOSIS — Z008 Encounter for other general examination: Secondary | ICD-10-CM | POA: Diagnosis not present

## 2023-04-13 DIAGNOSIS — K7469 Other cirrhosis of liver: Secondary | ICD-10-CM | POA: Diagnosis not present

## 2023-04-13 DIAGNOSIS — R634 Abnormal weight loss: Secondary | ICD-10-CM | POA: Diagnosis not present

## 2023-04-18 DIAGNOSIS — D500 Iron deficiency anemia secondary to blood loss (chronic): Secondary | ICD-10-CM | POA: Diagnosis not present

## 2023-04-18 DIAGNOSIS — R5382 Chronic fatigue, unspecified: Secondary | ICD-10-CM | POA: Diagnosis not present

## 2023-04-18 DIAGNOSIS — D61818 Other pancytopenia: Secondary | ICD-10-CM | POA: Diagnosis not present

## 2023-04-21 ENCOUNTER — Encounter
Admission: RE | Disposition: A | Discharge status: Home / Self Care | Payer: Self-pay | Source: Home / Self Care | Attending: Gastroenterology

## 2023-04-21 ENCOUNTER — Ambulatory Visit
Admission: RE | Admit: 2023-04-21 | Discharge: 2023-04-21 | Disposition: A | Discharge status: Home / Self Care | Payer: Medicare HMO | Attending: Gastroenterology | Admitting: Gastroenterology

## 2023-04-21 ENCOUNTER — Ambulatory Visit: Payer: Medicare HMO | Admitting: Anesthesiology

## 2023-04-21 ENCOUNTER — Other Ambulatory Visit: Payer: Self-pay

## 2023-04-21 ENCOUNTER — Encounter: Payer: Self-pay | Admitting: Gastroenterology

## 2023-04-21 DIAGNOSIS — K3189 Other diseases of stomach and duodenum: Secondary | ICD-10-CM | POA: Diagnosis not present

## 2023-04-21 DIAGNOSIS — D649 Anemia, unspecified: Secondary | ICD-10-CM | POA: Insufficient documentation

## 2023-04-21 DIAGNOSIS — Z7984 Long term (current) use of oral hypoglycemic drugs: Secondary | ICD-10-CM | POA: Insufficient documentation

## 2023-04-21 DIAGNOSIS — K219 Gastro-esophageal reflux disease without esophagitis: Secondary | ICD-10-CM | POA: Diagnosis not present

## 2023-04-21 DIAGNOSIS — K7460 Unspecified cirrhosis of liver: Secondary | ICD-10-CM | POA: Insufficient documentation

## 2023-04-21 DIAGNOSIS — I10 Essential (primary) hypertension: Secondary | ICD-10-CM | POA: Diagnosis not present

## 2023-04-21 DIAGNOSIS — D759 Disease of blood and blood-forming organs, unspecified: Secondary | ICD-10-CM | POA: Diagnosis not present

## 2023-04-21 DIAGNOSIS — I8500 Esophageal varices without bleeding: Secondary | ICD-10-CM | POA: Diagnosis not present

## 2023-04-21 DIAGNOSIS — E119 Type 2 diabetes mellitus without complications: Secondary | ICD-10-CM | POA: Insufficient documentation

## 2023-04-21 DIAGNOSIS — I8510 Secondary esophageal varices without bleeding: Secondary | ICD-10-CM | POA: Diagnosis not present

## 2023-04-21 DIAGNOSIS — Z79899 Other long term (current) drug therapy: Secondary | ICD-10-CM | POA: Insufficient documentation

## 2023-04-21 DIAGNOSIS — K2210 Ulcer of esophagus without bleeding: Secondary | ICD-10-CM | POA: Diagnosis not present

## 2023-04-21 HISTORY — PX: ESOPHAGOGASTRODUODENOSCOPY (EGD) WITH PROPOFOL: SHX5813

## 2023-04-21 HISTORY — PX: ESOPHAGEAL BRUSHING: SHX6842

## 2023-04-21 LAB — KOH PREP: KOH Prep: NONE SEEN

## 2023-04-21 SURGERY — ESOPHAGOGASTRODUODENOSCOPY (EGD) WITH PROPOFOL
Anesthesia: General

## 2023-04-21 MED ORDER — SODIUM CHLORIDE 0.9 % IV SOLN: INTRAVENOUS | Status: DC

## 2023-04-21 MED ORDER — DEXMEDETOMIDINE HCL IN NACL 80 MCG/20ML IV SOLN
INTRAVENOUS | Status: DC | PRN
  Administered 2023-04-21: 8 ug via INTRAVENOUS

## 2023-04-21 MED ORDER — PROPOFOL 10 MG/ML IV BOLUS
INTRAVENOUS | Status: DC | PRN
  Administered 2023-04-21: 30 mg via INTRAVENOUS
  Administered 2023-04-21: 70 mg via INTRAVENOUS
  Administered 2023-04-21: 30 mg via INTRAVENOUS
  Administered 2023-04-21: 20 mg via INTRAVENOUS

## 2023-04-21 MED ORDER — LIDOCAINE HCL (CARDIAC) PF 100 MG/5ML IV SOSY
PREFILLED_SYRINGE | INTRAVENOUS | Status: DC | PRN
  Administered 2023-04-21: 60 mg via INTRAVENOUS

## 2023-04-21 MED ORDER — OMEPRAZOLE 40 MG PO CPDR: 40.0000 mg | DELAYED_RELEASE_CAPSULE | Freq: Two times a day (BID) | ORAL | 2 refills | Status: DC

## 2023-04-21 NOTE — Anesthesia Preprocedure Evaluation (Signed)
Anesthesia Evaluation  Patient identified by MRN, date of birth, ID band Patient awake    Reviewed: Allergy & Precautions, NPO status , Patient's Chart, lab work & pertinent test results  Airway Mallampati: III  TM Distance: <3 FB Neck ROM: full  Mouth opening: Pediatric Airway  Dental  (+) Teeth Intact, Dental Advidsory Given   Pulmonary neg pulmonary ROS   Pulmonary exam normal breath sounds clear to auscultation       Cardiovascular Exercise Tolerance: Good hypertension, Pt. on medications negative cardio ROS Normal cardiovascular exam Rhythm:Regular Rate:Normal     Neuro/Psych negative neurological ROS  negative psych ROS   GI/Hepatic negative GI ROS, Neg liver ROS,GERD  Medicated,,(+) Cirrhosis         Endo/Other  negative endocrine ROSdiabetes, Type 2, Oral Hypoglycemic Agents    Renal/GU negative Renal ROS  negative genitourinary   Musculoskeletal   Abdominal  (+) + obese  Peds negative pediatric ROS (+)  Hematology  (+) Blood dyscrasia, anemia   Anesthesia Other Findings Past Medical History: No date: Anemia No date: Cirrhosis (HCC) No date: Diabetes mellitus without complication (HCC) No date: Esophagitis No date: GERD (gastroesophageal reflux disease) No date: History of colon polyps No date: Hypertension No date: Skin cancer 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  Past Surgical History: 02/27/2020: CATARACT EXTRACTION W/PHACO; Right     Comment:  Procedure: CATARACT EXTRACTION PHACO AND INTRAOCULAR               LENS PLACEMENT (IOC) RIGHT DIABETIC 3.78   00:55.4 6.8%;               Surgeon: Lockie Mola, MD;  Location: [facility]               SURGERY CNTR;  Service: Ophthalmology;  Laterality:               Right;  Diabetic - oral meds 03/12/2020: CATARACT EXTRACTION W/PHACO; Left     Comment:  Procedure: CATARACT EXTRACTION PHACO AND INTRAOCULAR               LENS PLACEMENT (IOC) LEFT DIABETIC 1.54  00:29.3 5.2%;                Surgeon: Lockie Mola, MD;  Location: [facility]               SURGERY CNTR;  Service: Ophthalmology;  Laterality: Left;              patient prefers arrival after 10am No date: COLONOSCOPY 01/13/2023: ESOPHAGEAL BANDING     Comment:  Procedure: ESOPHAGEAL BANDING;  Surgeon: Toney Reil, MD;  Location: [facility] ENDOSCOPY;  Service:               Gastroenterology;; 01/13/2023: ESOPHAGOGASTRODUODENOSCOPY; N/A     Comment:  Procedure: ESOPHAGOGASTRODUODENOSCOPY (EGD);  Surgeon:               Toney Reil, MD;  Location: [facility] ENDOSCOPY;                Service: Gastroenterology;  Laterality: N/A; 05/05/2016: ESOPHAGOGASTRODUODENOSCOPY (EGD) WITH PROPOFOL; N/A     Comment:  Procedure: ESOPHAGOGASTRODUODENOSCOPY (EGD) WITH               PROPOFOL;  Surgeon: Scot Jun, MD;  Location: [facility]              ENDOSCOPY;  Service: Endoscopy;  Laterality: N/A;  01/13/2023: HOT HEMOSTASIS     Comment:  Procedure: HOT HEMOSTASIS (ARGON PLASMA               COAGULATION/BICAP);  Surgeon: Toney Reil, MD;                Location: [facility] ENDOSCOPY;  Service: Gastroenterology;; No date: LIVER BIOPSY No date: SKIN CANCER EXCISION No date: TONSILLECTOMY 2005: UVULOPALATOPHARYNGOPLASTY     Reproductive/Obstetrics negative OB ROS                             Anesthesia Physical Anesthesia Plan  ASA: 3  Anesthesia Plan: General   Post-op Pain Management:    Induction: Intravenous  PONV Risk Score and Plan: 2 and Propofol infusion and TIVA  Airway Management Planned: Natural Airway and Nasal Cannula  Additional Equipment:   Intra-op Plan:   Post-operative Plan:   Informed Consent: I have reviewed the patients History and Physical, chart, labs and discussed the procedure including the risks, benefits and alternatives for the proposed anesthesia with the patient or authorized representative who has indicated  his/her understanding and acceptance.     Dental Advisory Given  Plan Discussed with: Anesthesiologist, CRNA and Surgeon  Anesthesia Plan Comments:        Anesthesia Quick Evaluation

## 2023-04-21 NOTE — H&P (Signed)
Arlyss Repress, MD [address]  [address]  [place], [place] [address]  Main: [phone]  Fax: [phone] Pager: [phone]  Primary Care Physician:  Danella Penton, MD Primary Gastroenterologist:  Dr. Arlyss Repress  Pre-Procedure History & Physical: HPI:  Jack Powell is a 72 y.o. male is here for an endoscopy.   Past Medical History:  Diagnosis Date   Anemia    Cirrhosis (HCC)    Diabetes mellitus without complication (HCC)    Esophagitis    GERD (gastroesophageal reflux disease)    History of colon polyps    Hypertension    Skin cancer    Sleep apnea    Wilson disease     Past Surgical History:  Procedure Laterality Date   CATARACT EXTRACTION W/PHACO Right 02/27/2020   Procedure: CATARACT EXTRACTION PHACO AND INTRAOCULAR LENS PLACEMENT (IOC) RIGHT DIABETIC 3.78   00:55.4 6.8%;  Surgeon: Lockie Mola, MD;  Location: [facility] SURGERY CNTR;  Service: Ophthalmology;  Laterality: Right;  Diabetic - oral meds   CATARACT EXTRACTION W/PHACO Left 03/12/2020   Procedure: CATARACT EXTRACTION PHACO AND INTRAOCULAR LENS PLACEMENT (IOC) LEFT DIABETIC 1.54 00:29.3 5.2%;  Surgeon: Lockie Mola, MD;  Location: [facility] SURGERY CNTR;  Service: Ophthalmology;  Laterality: Left;  patient prefers arrival after 10am   COLONOSCOPY     ESOPHAGEAL BANDING  01/13/2023   Procedure: ESOPHAGEAL BANDING;  Surgeon: Toney Reil, MD;  Location: [facility] ENDOSCOPY;  Service: Gastroenterology;;   ESOPHAGEAL BANDING  02/24/2023   Procedure: ESOPHAGEAL BANDING;  Surgeon: Toney Reil, MD;  Location: [facility] ENDOSCOPY;  Service: Gastroenterology;;   ESOPHAGOGASTRODUODENOSCOPY N/A 01/13/2023   Procedure: ESOPHAGOGASTRODUODENOSCOPY (EGD);  Surgeon: Toney Reil, MD;  Location: [facility] ENDOSCOPY;  Service: Gastroenterology;  Laterality: N/A;   ESOPHAGOGASTRODUODENOSCOPY (EGD) WITH PROPOFOL N/A 05/05/2016   Procedure: ESOPHAGOGASTRODUODENOSCOPY (EGD) WITH PROPOFOL;   Surgeon: Scot Jun, MD;  Location: [facility] ENDOSCOPY;  Service: Endoscopy;  Laterality: N/A;   ESOPHAGOGASTRODUODENOSCOPY (EGD) WITH PROPOFOL N/A 02/24/2023   Procedure: ESOPHAGOGASTRODUODENOSCOPY (EGD) WITH PROPOFOL;  Surgeon: Toney Reil, MD;  Location: [facility] ENDOSCOPY;  Service: Gastroenterology;  Laterality: N/A;   HOT HEMOSTASIS  01/13/2023   Procedure: HOT HEMOSTASIS (ARGON PLASMA COAGULATION/BICAP);  Surgeon: Toney Reil, MD;  Location: [facility] ENDOSCOPY;  Service: Gastroenterology;;   HOT HEMOSTASIS  02/24/2023   Procedure: HOT HEMOSTASIS (ARGON PLASMA COAGULATION/BICAP);  Surgeon: Toney Reil, MD;  Location: [facility] ENDOSCOPY;  Service: Gastroenterology;;   LIVER BIOPSY     SKIN CANCER EXCISION     TONSILLECTOMY     UVULOPALATOPHARYNGOPLASTY  2005    Prior to Admission medications   Medication Sig Start Date End Date Taking? Authorizing Provider  carvedilol (COREG) 6.25 MG tablet Take 3.125 mg by mouth 2 (two) times daily with a meal.   Yes [provider]  ALPRAZolam (XANAX) 0.25 MG tablet Take 0.25 mg by mouth at bedtime as needed for anxiety. Patient not taking: Reported on 01/12/2023    [provider]  cholecalciferol (VITAMIN D) 1000 units tablet Take 1,000 Units by mouth daily.    [provider]  donepezil (ARICEPT) 5 MG tablet Take 5 mg by mouth at bedtime.    [provider]  empagliflozin (JARDIANCE) 25 MG TABS tablet Take 25 mg by mouth daily.    [provider]  escitalopram (LEXAPRO) 10 MG tablet Take 10 mg by mouth daily. Patient not taking: Reported on 02/24/2023    [provider]  ferrous gluconate (FERGON) 324 MG tablet  Take 324 mg by mouth daily with breakfast. 09/02/21 07/15/23  [provider]  ketoconazole (NIZORAL) 2 % shampoo Apply 1 Application topically every other day. 06/28/22   [provider]  lactulose, encephalopathy, (CHRONULAC) 10 GM/15ML SOLN Take 30 g by mouth 3  (three) times daily.    [provider]  Magnesium 250 MG TABS Take by mouth daily.    [provider]  MULTIPLE VITAMIN PO Take 1 tablet by mouth daily.     [provider]  nadolol (CORGARD) 40 MG tablet Take 40 mg by mouth daily. Patient not taking: Reported on 02/24/2023    [provider]  pantoprazole (PROTONIX) 40 MG tablet Take 1 tablet (40 mg total) by mouth 2 (two) times daily before a meal. 01/15/23 03/16/23  Sreenath, Jonelle Sports, MD  rifaximin (XIFAXAN) 550 MG TABS tablet Take 550 mg by mouth 2 (two) times daily.    [provider]  tamsulosin (FLOMAX) 0.4 MG CAPS capsule 0.4 mg daily. 05/11/22   [provider]  triamterene-hydrochlorothiazide (MAXZIDE-25) 37.5-25 MG tablet Take 1 tablet by mouth daily.    [provider]  Trientine HCl (SYPRINE) 250 MG CAPS Take 4 capsules by mouth daily.     [provider]    Allergies as of 02/28/2023 - Review Complete 02/24/2023  Allergen Reaction Noted   Ace inhibitors Cough 10/08/2016   Serotonin Other (See Comments) 10/31/2017   Penicillins Rash 03/15/2016    Family History  Problem Relation Age of Onset   Diabetes Mother    Heart disease Mother    Cancer Father        colon    Social History   Socioeconomic History   Marital status: Married    Spouse name: Not on file   Number of children: Not on file   Years of education: Not on file   Highest education level: Not on file  Occupational History   Not on file  Tobacco Use   Smoking status: Never   Smokeless tobacco: Never  Vaping Use   Vaping status: Never Used  Substance and Sexual Activity   Alcohol use: No   Drug use: No   Sexual activity: Not Currently  Other Topics Concern   Not on file  Social History Narrative   Not on file   Social Drivers of Health   Financial Resource Strain: Low Risk  (04/04/2023)   Received from [facility]   Overall Financial Resource Strain (CARDIA)     Difficulty of Paying Living Expenses: Not hard at all  Food Insecurity: No Food Insecurity (04/04/2023)   Received from [facility]   Hunger Vital Sign    Worried About Running Out of Food in the Last Year: Never true    Ran Out of Food in the Last Year: Never true  Transportation Needs: No Transportation Needs (04/04/2023)   Received from [facility] - Transportation    Lack of Transportation (Medical): No    Lack of Transportation (Non-Medical): No  Physical Activity: Insufficiently Active (11/23/2022)   Received from [facility]   Exercise Vital Sign    Days of Exercise per Week: 2 days    Minutes of Exercise per Session: 20 min  Stress: No Stress Concern Present (11/23/2022)   Received from [facility] - Occupational Stress Questionnaire    Feeling of Stress : Not at all  Social Connections: Socially Integrated (11/23/2022)   Received from  [facility]   Social Network    How would you rate your social network (family, work, friends)?: Good participation with social networks  Intimate Partner Violence: Not At Risk (01/13/2023)   Humiliation, Afraid, Rape, and Kick questionnaire    Fear of Current or Ex-Partner: No    Emotionally Abused: No    Physically Abused: No    Sexually Abused: No    Review of Systems: See HPI, otherwise negative ROS  Physical Exam: BP (!) 128/52   Pulse 64   Temp (!) 97.3 F (36.3 C) (Temporal)   Resp 16   Ht 6' (1.829 m)   Wt 64.4 kg   SpO2 98%   BMI 19.26 kg/m  General:   Alert,  pleasant and cooperative in NAD Head:  Normocephalic and atraumatic. Neck:  Supple; no masses or thyromegaly. Lungs:  Clear throughout to auscultation.    Heart:  Regular rate and rhythm. Abdomen:  Soft, nontender and nondistended. Normal bowel sounds, without guarding, and without rebound.   Neurologic:  Alert and  oriented x4;  grossly normal neurologically.  Impression/Plan: Jack Powell is  here for an endoscopy to be performed for cirrhosis, variceal surveillance  Risks, benefits, limitations, and alternatives regarding  endoscopy have been reviewed with the patient.  Questions have been answered.  All parties agreeable.   Lannette Donath, MD  04/21/2023, 8:17 AM

## 2023-04-21 NOTE — Transfer of Care (Signed)
Immediate Anesthesia Transfer of Care Note  Patient: Jack Powell  Procedure(s) Performed: ESOPHAGOGASTRODUODENOSCOPY (EGD) WITH PROPOFOL  Patient Location: PACU  Anesthesia Type:General  Level of Consciousness: awake, alert , and oriented  Airway & Oxygen Therapy: Patient Spontanous Breathing and Patient connected to nasal cannula oxygen  Post-op Assessment: Report given to RN and Post -op Vital signs reviewed and stable  Post vital signs: Reviewed and stable  Last Vitals:  Vitals Value Taken Time  BP 114/54 04/21/23 0909  Temp 35.8 C 04/21/23 0849  Pulse 60 04/21/23 0910  Resp 12 04/21/23 0909  SpO2 98 % 04/21/23 0910  Vitals shown include unfiled device data.  Last Pain:  Vitals:   04/21/23 0849  TempSrc: Temporal  PainSc:          Complications: No notable events documented.

## 2023-04-21 NOTE — Op Note (Signed)
[facility] Gastroenterology Patient Name: Jack Powell Procedure Date: 04/21/2023 8:18 AM MRN: [id number] Account #: [id number] Date of Birth: 1951-03-30 Admit Type: Outpatient Age: 72 Room: [facility] ENDO ROOM 2 Gender: Male Note Status: Finalized Instrument Name: Upper Endoscope [id number] Procedure:             Upper GI endoscopy Indications:           Esophageal varices, Follow-up of esophageal varices Providers:             Toney Reil MD, MD Referring MD:          Danella Penton, MD (Referring MD) Medicines:             General Anesthesia Complications:         No immediate complications. Estimated blood loss: None. Procedure:             Pre-Anesthesia Assessment:                        - Prior to the procedure, a History and Physical was                         performed, and patient medications and allergies were                         reviewed. The patient is competent. The risks and                         benefits of the procedure and the sedation options and                         risks were discussed with the patient. All questions                         were answered and informed consent was obtained.                         Patient identification and proposed procedure were                         verified by the physician, the nurse, the                         anesthesiologist, the anesthetist and the technician                         in the pre-procedure area in the procedure room in the                         endoscopy suite. Mental Status Examination: alert and                         oriented. Airway Examination: normal oropharyngeal                         airway and neck mobility. Respiratory Examination:                         clear to auscultation. CV Examination: normal.  Prophylactic Antibiotics: The patient does not require                         prophylactic antibiotics. Prior Anticoagulants: The                          patient has taken no anticoagulant or antiplatelet                         agents. ASA Grade Assessment: III - A patient with                         severe systemic disease. After reviewing the risks and                         benefits, the patient was deemed in satisfactory                         condition to undergo the procedure. The anesthesia                         plan was to use general anesthesia. Immediately prior                         to administration of medications, the patient was                         re-assessed for adequacy to receive sedatives. The                         heart rate, respiratory rate, oxygen saturations,                         blood pressure, adequacy of pulmonary ventilation, and                         response to care were monitored throughout the                         procedure. The physical status of the patient was                         re-assessed after the procedure.                        After obtaining informed consent, the endoscope was                         passed under direct vision. Throughout the procedure,                         the patient's blood pressure, pulse, and oxygen                         saturations were monitored continuously. The Endoscope                         was introduced through the mouth, and advanced to the  second part of duodenum. The upper GI endoscopy was                         accomplished without difficulty. The patient tolerated                         the procedure well. Findings:      The duodenal bulb and second portion of the duodenum were normal.      Diffuse moderately erythematous mucosa without bleeding was found in the       gastric antrum.      The gastric fundus, gastric body and incisura were normal.      The cardia and gastric fundus were normal on retroflexion.      Three columns of small (< 5 mm) varices with no bleeding and no  stigmata       of recent bleeding were found in the middle third of the esophagus and       in the lower third of the esophagus. No red wale signs were present.       Scarring from prior treatment was visible. Evidence of partial       eradication was visible. The varices appeared smaller than they were at       prior exam.      Many superficial esophageal ulcers with no bleeding and no stigmata of       recent bleeding were found in the mid and distal esophagus. The largest       lesion was 10 mm in largest dimension. Cells for cytology were obtained       by brushing. Estimated blood loss: none. Impression:            - Normal duodenal bulb and second portion of the                         duodenum.                        - Erythematous mucosa in the antrum.                        - Normal gastric fundus, gastric body and incisura.                        - Small (< 5 mm) esophageal varices with no bleeding                         and no stigmata of recent bleeding.                        - Esophageal ulcers with no bleeding and no stigmata                         of recent bleeding. Cells for cytology obtained. Recommendation:        - Discharge patient to home (with spouse).                        - Low sodium diet today.                        - Continue present medications.                        -  Use Prilosec (omeprazole) 40 mg PO BID for 3 months.                        - Repeat upper endoscopy in 3 months to check healing. Procedure Code(s):     --- Professional ---                        [phone], Esophagogastroduodenoscopy, flexible,                         transoral; diagnostic, including collection of                         specimen(s) by brushing or washing, when performed                         (separate procedure) Diagnosis Code(s):     --- Professional ---                        K31.89, Other diseases of stomach and duodenum                        I85.00, Esophageal  varices without bleeding                        K22.10, Ulcer of esophagus without bleeding CPT copyright 2022 [employer]. All rights reserved. The codes documented in this report are preliminary and upon coder review may  be revised to meet current compliance requirements. Dr. Libby Maw Toney Reil MD, MD 04/21/2023 8:46:06 AM This report has been signed electronically. Number of Addenda: 0 Note Initiated On: 04/21/2023 8:18 AM Estimated Blood Loss:  Estimated blood loss: none.      [facility]

## 2023-04-22 ENCOUNTER — Encounter: Payer: Self-pay | Admitting: Gastroenterology

## 2023-04-26 DIAGNOSIS — E119 Type 2 diabetes mellitus without complications: Secondary | ICD-10-CM | POA: Diagnosis not present

## 2023-04-26 DIAGNOSIS — K7460 Unspecified cirrhosis of liver: Secondary | ICD-10-CM | POA: Diagnosis not present

## 2023-04-27 ENCOUNTER — Encounter: Payer: Self-pay | Admitting: Oncology

## 2023-04-27 ENCOUNTER — Inpatient Hospital Stay: Payer: Medicare HMO | Attending: Oncology | Admitting: Oncology

## 2023-04-27 ENCOUNTER — Inpatient Hospital Stay: Payer: Medicare HMO

## 2023-04-27 VITALS — BP 126/43 | HR 57 | Temp 96.1°F | Resp 19 | Wt 143.0 lb

## 2023-04-27 DIAGNOSIS — D539 Nutritional anemia, unspecified: Secondary | ICD-10-CM

## 2023-04-27 DIAGNOSIS — Z79899 Other long term (current) drug therapy: Secondary | ICD-10-CM | POA: Insufficient documentation

## 2023-04-27 DIAGNOSIS — D72819 Decreased white blood cell count, unspecified: Secondary | ICD-10-CM | POA: Insufficient documentation

## 2023-04-27 DIAGNOSIS — D649 Anemia, unspecified: Secondary | ICD-10-CM | POA: Insufficient documentation

## 2023-04-27 LAB — TSH: TSH: 2.914 u[IU]/mL (ref 0.350–4.500)

## 2023-04-27 LAB — RETICULOCYTES
Immature Retic Fract: 7.8 % (ref 2.3–15.9)
RBC.: 2.91 MIL/uL — ABNORMAL LOW (ref 4.22–5.81)
Retic Count, Absolute: 88.8 10*3/uL (ref 19.0–186.0)
Retic Ct Pct: 3.1 % (ref 0.4–3.1)

## 2023-04-27 LAB — FOLATE: Folate: 24 ng/mL (ref >5.9)

## 2023-04-27 LAB — LACTATE DEHYDROGENASE: LDH: 282 U/L — ABNORMAL HIGH (ref 98–192)

## 2023-04-27 LAB — VITAMIN B12: Vitamin B-12: 792 pg/mL (ref 180–914)

## 2023-04-27 NOTE — Progress Notes (Unsigned)
Hematology/Oncology Consult note [facility] Telephone:([phone] [phone]  Patient Care Team: Danella Penton, MD as PCP - General (Internal Medicine)   Name of the patient: Jack Powell  [id number]  [birth year]    Reason for referral-iron deficiency anemia   Referring physician-Dr. Bethann Punches   Date of visit: 04/27/23   History of presenting illness- patient is a 72 year old male with a past medical history of Wilson's disease, liver cirrhosis and pancytopenia. He has been referred for anemia.  CBC on 04/26/2023 showed white count of 2.8, H&H of 9.7/28.9 with an MCV of 107.4 and a platelet count of 67.  Iron studies were normal with an iron saturation of 27% with a ferritin level of 84.  CMP showed elevated bilirubin of 2 with a normal ALK phosphatase AST ALT.  aFP normal at 5.1.  Looking back at his prior CBCs patient has had Chronic thrombocytopenia with a platelet count has remained stable between 60s to 70s over the last 1 year.  White cell count has been fluctuating between 2.5-4 predominantly with lymphopenia.  His hemoglobin was 12 back in November 2022 and dropped to 10.8 in March 2023.  He has had persistent macrocytosis   ECOG PS- ***  Pain scale- ***   Review of systems- ROS  Allergies  Allergen Reactions  . Ace Inhibitors Cough  . Serotonin Other (See Comments)    hallucinations  . Penicillins Rash    Patient Active Problem List   Diagnosis Date Noted  . Ulcer of esophagus without bleeding 04/21/2023  . Gastric erythema 02/24/2023  . Secondary esophageal varices without bleeding (HCC) 02/24/2023  . Esophageal varices in cirrhosis (HCC) 01/14/2023  . Acute upper GI bleed 01/13/2023  . Secondary esophageal varices with bleeding (HCC) 01/13/2023  . 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 Upper GI bleed 01/12/2023  . Wilson's disease 01/12/2023  . Liver cirrhosis (HCC) 01/12/2023  . Essential  hypertension 01/12/2023  . Pancytopenia (HCC) 01/12/2023  . Thrombocytopenia (HCC) 01/12/2023     Past Medical History:  Diagnosis Date  . Anemia   . Cirrhosis (HCC)   . Diabetes mellitus without complication (HCC)   . Esophagitis   . GERD (gastroesophageal reflux disease)   . History of colon polyps   . Hypertension   . Skin cancer   . Sleep apnea   . Wilson disease      Past Surgical History:  Procedure Laterality Date  . CATARACT EXTRACTION W/PHACO Right 02/27/2020   Procedure: CATARACT EXTRACTION PHACO AND INTRAOCULAR LENS PLACEMENT (IOC) RIGHT DIABETIC 3.78   00:55.4 6.8%;  Surgeon: Lockie Mola, MD;  Location: [facility];  Service: Ophthalmology;  Laterality: Right;  Diabetic - oral meds  . CATARACT EXTRACTION W/PHACO Left 03/12/2020   Procedure: CATARACT EXTRACTION PHACO AND INTRAOCULAR LENS PLACEMENT (IOC) LEFT DIABETIC 1.54 00:29.3 5.2%;  Surgeon: Lockie Mola, MD;  Location: [facility];  Service: Ophthalmology;  Laterality: Left;  patient prefers arrival after 10am  . COLONOSCOPY    . ESOPHAGEAL BANDING  01/13/2023   Procedure: ESOPHAGEAL BANDING;  Surgeon: Toney Reil, MD;  Location: [facility] END[facility]COPY;  Service: Gastroenterology;;  . ESOPHAGEAL BANDING  02/24/2023   Procedure: ESOPHAGEAL BANDING;  Surgeon: Toney Reil, MD;  Location: [facility] ENDOSCOPY;  Service: Gastroenterology;;  . ESOPHAGEAL BRUSHING  04/21/2023   Procedure: ESOPHAGEAL BRUSHING;  Surgeon: Toney Reil, MD;  Location: [facility] ENDOSCOPY;  Service: Gastroenterology;;  . ESOPHAGOGASTRODUODENOSCOPY N/A 01/13/2023   Procedure: ESOPHAGOGASTRODUODENOSCOPY (EGD);  Surgeon: Toney Reil, MD;  Location: [facility] ENDOSCOPY;  Service: Gastroenterology;  Laterality: N/A;  . ESOPHAGOGASTRODUODENOSCOPY (EGD) WITH PROPOFOL N/A 05/05/2016   Procedure: ESOPHAGOGASTRODUODENOSCOPY (EGD) WITH PROPOFOL;  Surgeon: Scot Jun, MD;  Location: [facility] ENDOSCOPY;  Service:  Endoscopy;  Laterality: N/A;  . ESOPHAGOGASTRODUODENOSCOPY (EGD) WITH PROPOFOL N/A 02/24/2023   Procedure: ESOPHAGOGASTRODUODENOSCOPY (EGD) WITH PROPOFOL;  Surgeon: Toney Reil, MD;  Location: [facility]COPY;  Service: Gastroenterology;  Laterality: N/A;  . ESOPHAGOGASTRODUODENOSCOPY (EGD) WITH PROPOFOL N/A 04/21/2023   Procedure: ESOPHAGOGASTRODUODENOSCOPY (EGD) WITH PROPOFOL;  Surgeon: Toney Reil, MD;  Location: [facility] ENDOSCOPY;  Service: Gastroenterology;  Laterality: N/A;  . HOT HEMOSTASIS  01/13/2023   Procedure: HOT HEMOSTASIS (ARGON PLASMA COAGULATION/BICAP);  Surgeon: Toney Reil, MD;  Location: [facility] ENDOSCOPY;  Service: Gastroenterology;;  . HOT HEMOSTASIS  02/24/2023   Procedure: HOT HEMOSTASIS (ARGON PLASMA COAGULATION/BICAP);  Surgeon: Toney Reil, MD;  Location: [facility] END[facility]COPY;  Service: Gastroenterology;;  . LIVER BIOPSY    . SKIN CANCER EXCISION    . TONSILLECTOMY    . UVULOPALATOPHARYNGOPLASTY  2005    Social History   Socioeconomic History  . Marital status: Married    Spouse name: Not on file  . Number of children: Not on file  . Years of education: Not on file  . Highest education level: Not on file  Occupational History  . Not on file  Tobacco Use  . Smoking status: Never  . Smokeless tobacco: Never  Vaping Use  . Vaping status: Never Used  Substance and Sexual Activity  . Alcohol use: No  . Drug use: No  . Sexual activity: Not Currently  Other Topics Concern  . Not on file  Social History Narrative  . Not on file   Social Drivers of Health   Financial Resource Strain: Low Risk  (04/04/2023)   Received from [facility]   Overall Financial Resource Strain (CARDIA)   . Difficulty of Paying Living Expenses: Not hard at all  Food Insecurity: No Food Insecurity (04/04/2023)   Received from [facility]   Hunger Vital Sign   . Worried About Programme researcher, broadcasting/film/video in the Last Year: Never true   . Ran Out of Food in the Last  Year: Never true  Transportation Needs: No Transportation Needs (04/04/2023)   Received from [facility] - Transportation   . Lack of Transportation (Medical): No   . Lack of Transportation (Non-Medical): No  Physical Activity: Insufficiently Active (11/23/2022)   Received from [facility]   Exercise Vital Sign   . Days of Exercise per Week: 2 days   . Minutes of Exercise per Session: 20 min  Stress: No Stress Concern Present (11/23/2022)   Received from [facility] - Occupational Stress Questionnaire   . Feeling of Stress : Not at all  Social Connections: Socially Integrated (11/23/2022)   Received from [facility]   Social Network   . How would you rate your social network (family, work, friends)?: Good participation with social networks  Intimate Partner Violence: Not At Risk (01/13/2023)   Humiliation, Afraid, Rape, and Kick questionnaire   . Fear of Current or Ex-Partner: No   . Emotionally Abused: No   . Physically Abused: No   . Sexually Abused: No     Family History  Problem Relation Age of Onset  . Diabetes Mother   . Heart disease Mother   . Cancer Father  colon  . Heart attack Father      Current Outpatient Medications:  .  ondansetron (ZOFRAN-ODT) 4 MG disintegrating tablet, Take 4 mg by mouth every 8 (eight) hours as needed., Disp: , Rfl:  .  carvedilol (COREG) 6.25 MG tablet, Take 3.125 mg by mouth 2 (two) times daily with a meal., Disp: , Rfl:  .  cholecalciferol (VITAMIN D) 1000 units tablet, Take 1,000 Units by mouth daily., Disp: , Rfl:  .  donepezil (ARICEPT) 5 MG tablet, Take 5 mg by mouth at bedtime., Disp: , Rfl:  .  empagliflozin (JARDIANCE) 25 MG TABS tablet, Take 25 mg by mouth daily., Disp: , Rfl:  .  ferrous gluconate (FERGON) 324 MG tablet, Take 324 mg by mouth daily with breakfast., Disp: , Rfl:  .  ketoconazole (NIZORAL) 2 % shampoo, Apply 1 Application topically every other day.,  Disp: , Rfl:  .  lactulose, encephalopathy, (CHRONULAC) 10 GM/15ML SOLN, Take 30 g by mouth 3 (three) times daily., Disp: , Rfl:  .  Magnesium 250 MG TABS, Take by mouth daily., Disp: , Rfl:  .  MULTIPLE VITAMIN PO, Take 1 tablet by mouth daily. , Disp: , Rfl:  .  omeprazole (PRILOSEC) 40 MG capsule, Take 1 capsule (40 mg total) by mouth 2 (two) times daily before a meal., Disp: 180 capsule, Rfl: 2 .  pantoprazole (PROTONIX) 40 MG tablet, Take by mouth. (Patient not taking: Reported on 04/27/2023), Disp: , Rfl:  .  PARoxetine (PAXIL) 20 MG tablet, Take by mouth. (Patient not taking: Reported on 04/27/2023), Disp: , Rfl:  .  potassium chloride (KLOR-CON) 8 MEQ tablet, SMARTSIG:1.0 Tablet(s) By Mouth Daily, Disp: , Rfl:  .  rifaximin (XIFAXAN) 550 MG TABS tablet, Take 550 mg by mouth 2 (two) times daily., Disp: , Rfl:  .  tamsulosin (FLOMAX) 0.4 MG CAPS capsule, 0.4 mg daily., Disp: , Rfl:  .  triamterene-hydrochlorothiazide (MAXZIDE-25) 37.5-25 MG tablet, Take 1 tablet by mouth daily., Disp: , Rfl:  .  Trientine HCl (SYPRINE) 250 MG CAPS, Take 4 capsules by mouth daily. , Disp: , Rfl:    Physical exam: There were no vitals filed for this visit. Physical Exam        Latest Ref Rng & Units 01/15/2023    9:45 AM  CMP  Glucose 70 - 99 mg/dL 366   BUN 8 - 23 mg/dL 17   Creatinine 4.40 - 1.24 mg/dL 3.47   Sodium 425 - 956 mmol/L 137   Potassium 3.5 - 5.1 mmol/L 4.0   Chloride 98 - 111 mmol/L 104   CO2 22 - 32 mmol/L 25   Calcium 8.9 - 10.3 mg/dL 7.4       Latest Ref Rng & Units 01/15/2023    9:45 AM  CBC  WBC 4.0 - 10.5 K/uL 3.7   Hemoglobin 13.0 - 17.0 g/dL 9.5   Hematocrit 38.7 - 52.0 % 28.3   Platelets 150 - 400 K/uL 75     No images are attached to the encounter.  No results found.  Assessment and plan- Patient is a 72 y.o. male ***   Thank you for this kind referral and the opportunity to participate in the care of this  Patient   Visit Diagnosis No diagnosis  found.  Dr. Owens Shark, MD, MPH [facility] [phone] 04/27/2023

## 2023-04-28 LAB — KAPPA/LAMBDA LIGHT CHAINS
Kappa free light chain: 20 mg/L — ABNORMAL HIGH (ref 3.3–19.4)
Kappa, lambda light chain ratio: 0.98 (ref 0.26–1.65)
Lambda free light chains: 20.5 mg/L (ref 5.7–26.3)

## 2023-04-28 LAB — HAPTOGLOBIN: Haptoglobin: <10 mg/dL — ABNORMAL LOW (ref 32–363)

## 2023-04-28 NOTE — Anesthesia Postprocedure Evaluation (Signed)
Anesthesia Post Note  Patient: Lucy Sacre Whiteside  Procedure(s) Performed: ESOPHAGOGASTRODUODENOSCOPY (EGD) WITH PROPOFOL ESOPHAGEAL BRUSHING  Patient location during evaluation: Endoscopy Anesthesia Type: General Level of consciousness: awake and alert Pain management: pain level controlled Vital Signs Assessment: post-procedure vital signs reviewed and stable Respiratory status: spontaneous breathing, nonlabored ventilation, respiratory function stable and patient connected to nasal cannula oxygen Cardiovascular status: blood pressure returned to baseline and stable Postop Assessment: no apparent nausea or vomiting Anesthetic complications: no   No notable events documented.   Last Vitals:  Vitals:   04/21/23 0900 04/21/23 0909  BP: (!) 111/49 (!) 114/54  Pulse: (!) 58 (!) 58  Resp: 13 12  Temp:    SpO2: 96% 98%    Last Pain:  Vitals:   04/21/23 0849  TempSrc: Temporal  PainSc:                  Lenard Simmer

## 2023-05-01 LAB — MULTIPLE MYELOMA PANEL, SERUM
Albumin SerPl Elph-Mcnc: 3.2 g/dL (ref 2.9–4.4)
Albumin/Glob SerPl: 1.7 (ref 0.7–1.7)
Alpha 1: 0.2 g/dL (ref 0.0–0.4)
Alpha2 Glob SerPl Elph-Mcnc: 0.4 g/dL (ref 0.4–1.0)
B-Globulin SerPl Elph-Mcnc: 0.7 g/dL (ref 0.7–1.3)
Gamma Glob SerPl Elph-Mcnc: 0.7 g/dL (ref 0.4–1.8)
Globulin, Total: 2 g/dL — ABNORMAL LOW (ref 2.2–3.9)
IgA: 179 mg/dL (ref 61–437)
IgG (Immunoglobin G), Serum: 673 mg/dL (ref 603–1613)
IgM (Immunoglobulin M), Srm: 99 mg/dL (ref 20–172)
Total Protein ELP: 5.2 g/dL — ABNORMAL LOW (ref 6.0–8.5)

## 2023-05-05 DIAGNOSIS — E119 Type 2 diabetes mellitus without complications: Secondary | ICD-10-CM | POA: Diagnosis not present

## 2023-05-05 DIAGNOSIS — F32A Depression, unspecified: Secondary | ICD-10-CM | POA: Diagnosis not present

## 2023-05-05 DIAGNOSIS — I8510 Secondary esophageal varices without bleeding: Secondary | ICD-10-CM | POA: Diagnosis not present

## 2023-05-05 DIAGNOSIS — K7460 Unspecified cirrhosis of liver: Secondary | ICD-10-CM | POA: Diagnosis not present

## 2023-05-06 ENCOUNTER — Telehealth: Payer: Self-pay | Admitting: *Deleted

## 2023-05-06 ENCOUNTER — Encounter: Payer: Self-pay | Admitting: *Deleted

## 2023-05-06 NOTE — Telephone Encounter (Signed)
Patient would like the results from recent lab work.

## 2023-05-06 NOTE — Telephone Encounter (Signed)
Please let him know that we will discuss in detail on 05/22/2022.There is no overt concerning valus based on his recent bloodwork

## 2023-05-23 ENCOUNTER — Telehealth: Payer: Self-pay | Admitting: *Deleted

## 2023-05-23 ENCOUNTER — Inpatient Hospital Stay: Payer: Medicare Other | Admitting: Oncology

## 2023-05-23 ENCOUNTER — Encounter: Payer: Self-pay | Admitting: Oncology

## 2023-05-23 ENCOUNTER — Other Ambulatory Visit: Payer: Medicare HMO

## 2023-05-23 ENCOUNTER — Inpatient Hospital Stay: Payer: Medicare Other | Attending: Oncology

## 2023-05-23 ENCOUNTER — Ambulatory Visit: Payer: Medicare HMO | Admitting: Oncology

## 2023-05-23 VITALS — BP 126/55 | HR 59 | Temp 97.5°F | Resp 19 | Wt 136.8 lb

## 2023-05-23 DIAGNOSIS — D539 Nutritional anemia, unspecified: Secondary | ICD-10-CM | POA: Diagnosis present

## 2023-05-23 DIAGNOSIS — D696 Thrombocytopenia, unspecified: Secondary | ICD-10-CM | POA: Diagnosis not present

## 2023-05-23 DIAGNOSIS — Z79899 Other long term (current) drug therapy: Secondary | ICD-10-CM | POA: Insufficient documentation

## 2023-05-23 DIAGNOSIS — D72819 Decreased white blood cell count, unspecified: Secondary | ICD-10-CM | POA: Diagnosis not present

## 2023-05-23 LAB — CBC WITH DIFFERENTIAL/PLATELET
Abs Immature Granulocytes: 0.03 10*3/uL (ref 0.00–0.07)
Basophils Absolute: 0 10*3/uL (ref 0.0–0.1)
Basophils Relative: 0 %
Eosinophils Absolute: 0.1 10*3/uL (ref 0.0–0.5)
Eosinophils Relative: 2 %
HCT: 28.2 % — ABNORMAL LOW (ref 39.0–52.0)
Hemoglobin: 9.2 g/dL — ABNORMAL LOW (ref 13.0–17.0)
Immature Granulocytes: 1 %
Lymphocytes Relative: 17 %
Lymphs Abs: 0.5 10*3/uL — ABNORMAL LOW (ref 0.7–4.0)
MCH: 35.2 pg — ABNORMAL HIGH (ref 26.0–34.0)
MCHC: 32.6 g/dL (ref 30.0–36.0)
MCV: 108 fL — ABNORMAL HIGH (ref 80.0–100.0)
Monocytes Absolute: 0.2 10*3/uL (ref 0.1–1.0)
Monocytes Relative: 6 %
Neutro Abs: 2.1 10*3/uL (ref 1.7–7.7)
Neutrophils Relative %: 74 %
Platelets: 78 10*3/uL — ABNORMAL LOW (ref 150–400)
RBC: 2.61 MIL/uL — ABNORMAL LOW (ref 4.22–5.81)
RDW: 17 % — ABNORMAL HIGH (ref 11.5–15.5)
Smear Review: NORMAL
WBC: 2.8 10*3/uL — ABNORMAL LOW (ref 4.0–10.5)
nRBC: 0 % (ref 0.0–0.2)

## 2023-05-23 NOTE — Progress Notes (Signed)
 Hematology/Oncology Consult note [facility]  Telephone:([phone] [phone]  Patient Care Team: Cleotilde Oneil FALCON, MD as PCP - General (Internal Medicine)   Name of the patient: Jack Powell  [id number]  June 12, 1950   Date of visit: 05/23/23  Diagnosis- macrocytic anemia of unclear etiology  Chief complaint/ Reason for visit- discuss results of bloodwork  Heme/Onc history:  patient is a 73 year old male with a past medical history of Wilson's disease, liver cirrhosis and pancytopenia. He has been referred for anemia.  CBC on 04/26/2023 showed white count of 2.8, H&H of 9.7/28.9 with an MCV of 107.4 and a platelet count of 67.  Iron  studies were normal with an iron  saturation of 27% with a ferritin level of 84.  CMP showed elevated bilirubin of 2 with a normal ALK phosphatase AST ALT.  aFP normal at 5.1.  Looking back at his prior CBCs patient has had Chronic thrombocytopenia with a platelet count has remained stable between 60s to 70s over the last 1 year.  White cell count has been fluctuating between 2.5-4 predominantly with lymphopenia.  His hemoglobin was 12 back in November 2022 and dropped to 10.8 in March 2023.  He has had persistent macrocytosis.  Patient has been on syprine  medication for many years as a chelating agent.  He was started on Xifaxan  for encephalopathy in the last couple of years   Appetite has been generally good but he has still been losing weight.  He has been trying to keep up with his protein intake in the form of protein shakes.  He has been getting surveillance scans for hepatocellular carcinoma surveillance and last scan was in September 2024 which showed cirrhotic liver morphology without focal hepatic lesion and evidence of splenomegaly and portal hypertension.  He is a lifelong non-smoker   Blood work from 04/27/2023 were as follows: B12 folate TSH normal.  Myeloma panel showed no M protein and serum free light chain  ratio was normal.  LDH mildly elevated at 282.  Haptoglobin less than 10.  Reticulocyte count normal at 3.1.  Interval history- He has ongoing fatigue and has lost 7 pounds as compared to last month.  ECOG PS- 2 Pain scale- 0   Review of systems- Review of Systems  Constitutional:  Positive for malaise/fatigue and weight loss. Negative for chills and fever.  HENT:  Negative for congestion, ear discharge and nosebleeds.   Eyes:  Negative for blurred vision.  Respiratory:  Negative for cough, hemoptysis, sputum production, shortness of breath and wheezing.   Cardiovascular:  Negative for chest pain, palpitations, orthopnea and claudication.  Gastrointestinal:  Negative for abdominal pain, blood in stool, constipation, diarrhea, heartburn, melena, nausea and vomiting.  Genitourinary:  Negative for dysuria, flank pain, frequency, hematuria and urgency.  Musculoskeletal:  Negative for back pain, joint pain and myalgias.  Skin:  Negative for rash.  Neurological:  Negative for dizziness, tingling, focal weakness, seizures, weakness and headaches.  Endo/Heme/Allergies:  Does not bruise/bleed easily.  Psychiatric/Behavioral:  Negative for depression and suicidal ideas. The patient does not have insomnia.       Allergies  Allergen Reactions   Ace Inhibitors Cough   Serotonin Other (See Comments)    hallucinations   Penicillins Rash     Past Medical History:  Diagnosis Date   Anemia    Cirrhosis (HCC)    Diabetes mellitus without complication (HCC)    Esophagitis    GERD (gastroesophageal reflux disease)    History of  colon polyps    Hypertension    Skin cancer    Sleep apnea    Wilson disease      Past Surgical History:  Procedure Laterality Date   CATARACT EXTRACTION W/PHACO Right 02/27/2020   Procedure: CATARACT EXTRACTION PHACO AND INTRAOCULAR LENS PLACEMENT (IOC) RIGHT DIABETIC 3.78   00:55.4 6.8%;  Surgeon: Mittie Gaskin, MD;  Location: [facility];   Service: Ophthalmology;  Laterality: Right;  Diabetic - oral meds   CATARACT EXTRACTION W/PHACO Left 03/12/2020   Procedure: CATARACT EXTRACTION PHACO AND INTRAOCULAR LENS PLACEMENT (IOC) LEFT DIABETIC 1.54 00:29.3 5.2%;  Surgeon: Mittie Gaskin, MD;  Location: [facility];  Service: Ophthalmology;  Laterality: Left;  patient prefers arrival after 10am   COLONOSCOPY     ESOPHAGEAL BANDING  01/13/2023   Procedure: ESOPHAGEAL BANDING;  Surgeon: Unk Corinn Skiff, MD;  Location: [facility] ENDOSCOPY;  Service: Gastroenterology;;   ESOPHAGEAL BANDING  02/24/2023   Procedure: ESOPHAGEAL BANDING;  Surgeon: Unk Corinn Skiff, MD;  Location: [facility] ENDOSCOPY;  Service: Gastroenterology;;   ESOPHAGEAL BRUSHING  04/21/2023   Procedure: ESOPHAGEAL BRUSHING;  Surgeon: Unk Corinn Skiff, MD;  Location: [facility] ENDOSCOPY;  Service: Gastroenterology;;   ESOPHAGOGASTRODUODENOSCOPY N/A 01/13/2023   Procedure: ESOPHAGOGASTRODUODENOSCOPY (EGD);  Surgeon: Unk Corinn Skiff, MD;  Location: [facility] ENDOSCOPY;  Service: Gastroenterology;  Laterality: N/A;   ESOPHAGOGASTRODUODENOSCOPY (EGD) WITH PROPOFOL  N/A 05/05/2016   Procedure: ESOPHAGOGASTRODUODENOSCOPY (EGD) WITH PROPOFOL ;  Surgeon: Lamar ONEIDA Holmes, MD;  Location: [facility] ENDOSCOPY;  Service: Endoscopy;  Laterality: N/A;   ESOPHAGOGASTRODUODENOSCOPY (EGD) WITH PROPOFOL  N/A 02/24/2023   Procedure: ESOPHAGOGASTRODUODENOSCOPY (EGD) WITH PROPOFOL ;  Surgeon: Unk Corinn Skiff, MD;  Location: [facility] ENDOSCOPY;  Service: Gastroenterology;  Laterality: N/A;   ESOPHAGOGASTRODUODENOSCOPY (EGD) WITH PROPOFOL  N/A 04/21/2023   Procedure: ESOPHAGOGASTRODUODENOSCOPY (EGD) WITH PROPOFOL ;  Surgeon: Unk Corinn Skiff, MD;  Location: [facility] ENDOSCOPY;  Service: Gastroenterology;  Laterality: N/A;   HOT HEMOSTASIS  01/13/2023   Procedure: HOT HEMOSTASIS (ARGON PLASMA COAGULATION/BICAP);  Surgeon: Unk Corinn Skiff, MD;  Location: [facility] ENDOSCOPY;  Service: Gastroenterology;;   HOT  HEMOSTASIS  02/24/2023   Procedure: HOT HEMOSTASIS (ARGON PLASMA COAGULATION/BICAP);  Surgeon: Unk Corinn Skiff, MD;  Location: [facility] ENDOSCOPY;  Service: Gastroenterology;;   LIVER BIOPSY     SKIN CANCER EXCISION     TONSILLECTOMY     UVULOPALATOPHARYNGOPLASTY  2005    Social History   Socioeconomic History   Marital status: Married    Spouse name: Not on file   Number of children: Not on file   Years of education: Not on file   Highest education level: Not on file  Occupational History   Not on file  Tobacco Use   Smoking status: Never   Smokeless tobacco: Never  Vaping Use   Vaping status: Never Used  Substance and Sexual Activity   Alcohol  use: No   Drug use: No   Sexual activity: Not Currently  Other Topics Concern   Not on file  Social History Narrative   Not on file   Social Drivers of Health   Financial Resource Strain: Low Risk  (05/05/2023)   Received from [facility]   Overall Financial Resource Strain (CARDIA)    Difficulty of Paying Living Expenses: Not hard at all  Food Insecurity: No Food Insecurity (05/05/2023)   Received from [facility]   Hunger Vital Sign    Worried About Running Out of Food in the Last Year: Never true    Ran Out of Food in the  Last Year: Never true  Transportation Needs: No Transportation Needs (05/05/2023)   Received from [facility] - Transportation    In the past 12 months, has lack of transportation kept you from medical appointments or from getting medications?: No    Lack of Transportation (Non-Medical): No  Physical Activity: Insufficiently Active (11/23/2022)   Received from [facility]   Exercise Vital Sign    Days of Exercise per Week: 2 days    Minutes of Exercise per Session: 20 min  Stress: No Stress Concern Present (11/23/2022)   Received from [facility] - Occupational Stress Questionnaire    Feeling  of Stress : Not at all  Social Connections: Socially Integrated (11/23/2022)   Received from [facility]   Social Network    How would you rate your social network (family, work, friends)?: Good participation with social networks  Intimate Partner Violence: Not At Risk (01/13/2023)   Humiliation, Afraid, Rape, and Kick questionnaire    Fear of Current or Ex-Partner: No    Emotionally Abused: No    Physically Abused: No    Sexually Abused: No    Family History  Problem Relation Age of Onset   Diabetes Mother    Heart disease Mother    Cancer Father        colon   Heart attack Father      Current Outpatient Medications:    benzonatate  (TESSALON ) 200 MG capsule, SMARTSIG:1 Capsule(s) By Mouth 1-3 Times Daily PRN, Disp: , Rfl:    LAGEVRIO 200 MG CAPS capsule, Take 4 capsules by mouth 2 (two) times daily., Disp: , Rfl:    carvedilol  (COREG ) 6.25 MG tablet, Take 3.125 mg by mouth 2 (two) times daily with a meal., Disp: , Rfl:    cholecalciferol  (VITAMIN D ) 1000 units tablet, Take 1,000 Units by mouth daily., Disp: , Rfl:    donepezil  (ARICEPT ) 5 MG tablet, Take 5 mg by mouth at bedtime., Disp: , Rfl:    empagliflozin  (JARDIANCE ) 25 MG TABS tablet, Take 25 mg by mouth daily., Disp: , Rfl:    ferrous gluconate  (FERGON) 324 MG tablet, Take 324 mg by mouth daily with breakfast., Disp: , Rfl:    ketoconazole  (NIZORAL ) 2 % shampoo, Apply 1 Application topically every other day., Disp: , Rfl:    lactulose , encephalopathy, (CHRONULAC ) 10 GM/15ML SOLN, Take 30 g by mouth 3 (three) times daily., Disp: , Rfl:    Magnesium  250 MG TABS, Take by mouth daily., Disp: , Rfl:    MULTIPLE VITAMIN PO, Take 1 tablet by mouth daily. , Disp: , Rfl:    omeprazole  (PRILOSEC) 40 MG capsule, Take 1 capsule (40 mg total) by mouth 2 (two) times daily before a meal., Disp: 180 capsule, Rfl: 2   ondansetron  (ZOFRAN -ODT) 4 MG disintegrating tablet, Take 4 mg by mouth every 8 (eight) hours as needed., Disp: , Rfl:     pantoprazole  (PROTONIX ) 40 MG tablet, Take by mouth. (Patient not taking: Reported on 04/27/2023), Disp: , Rfl:    PARoxetine  (PAXIL ) 20 MG tablet, Take by mouth. (Patient not taking: Reported on 04/27/2023), Disp: , Rfl:    potassium chloride  (KLOR-CON ) 8 MEQ tablet, SMARTSIG:1.0 Tablet(s) By Mouth Daily, Disp: , Rfl:    rifaximin  (XIFAXAN ) 550 MG TABS tablet, Take 550 mg by mouth 2 (two) times daily., Disp: , Rfl:    tamsulosin  (FLOMAX ) 0.4 MG CAPS capsule, 0.4 mg daily., Disp: , Rfl:  triamterene -hydrochlorothiazide  (MAXZIDE -25) 37.5-25 MG tablet, Take 1 tablet by mouth daily., Disp: , Rfl:    Trientine  HCl (SYPRINE ) 250 MG CAPS, Take 4 capsules by mouth daily. , Disp: , Rfl:   Physical exam:  Vitals:   05/23/23 1046  BP: (!) 126/55  Pulse: (!) 59  Resp: 19  Temp: (!) 97.5 F (36.4 C)  TempSrc: Tympanic  SpO2: 98%  Weight: 136 lb 12.8 oz (62.1 kg)   Physical Exam Cardiovascular:     Rate and Rhythm: Normal rate and regular rhythm.     Heart sounds: Normal heart sounds.  Pulmonary:     Effort: Pulmonary effort is normal.     Breath sounds: Normal breath sounds.  Skin:    General: Skin is warm and dry.  Neurological:     Mental Status: He is alert and oriented to person, place, and time.         Latest Ref Rng & Units 01/15/2023    9:45 AM  CMP  Glucose 70 - 99 mg/dL 765   BUN 8 - 23 mg/dL 17   Creatinine 9.38 - 1.24 mg/dL 9.30   Sodium 864 - 854 mmol/L 137   Potassium 3.5 - 5.1 mmol/L 4.0   Chloride 98 - 111 mmol/L 104   CO2 22 - 32 mmol/L 25   Calcium 8.9 - 10.3 mg/dL 7.4       Latest Ref Rng & Units 05/23/2023   10:17 AM  CBC  WBC 4.0 - 10.5 K/uL 2.8   Hemoglobin 13.0 - 17.0 g/dL 9.2   Hematocrit 60.9 - 52.0 % 28.2   Platelets 150 - 400 K/uL 78     No images are attached to the encounter.  No results found.   Assessment and plan- Patient is a 73 y.o. male here for routine follow-up of macrocytic anemia  Patient has chronic leukopenia  /Thrombocytopenia likely secondary to cirrhosis.  However his anemia has been gradually worsening over the last 2 years and has drifted down from 12 prior presently to the nines.  Hemoglobin was 9.9 in September 2024 and is presently at 9.2.  Iron  studies are not indicative of iron  deficiency.  B12 folate TSH normal.  Myeloma panel was not suggestive of multiple myeloma.  I would therefore like to get a bone marrow biopsy at this time to rule out MDS given presence of macrocytosis.  Given his ongoing weight loss of bone marrow biopsy does not reveal any overt etiology for his anemia I will consider getting CT scans to rule out malignancy as well.  I will tentatively see him back a week after his bone marrow biopsy gets done.  Although his haptoglobin was undetectable this can be seen in patients with cirrhosis.  Reticulocyte count was normal and therefore this is not overly indicative of hemolytic anemia.  I will be checking Coombs test with next set of labs.   Visit Diagnosis 1. Macrocytic anemia      Dr. Annah Skene, MD, MPH [facility] [phone] 05/23/2023 1:54 PM

## 2023-05-23 NOTE — Telephone Encounter (Signed)
 Called the patient and let him know that we did have a bone marrow biopsy opening for January 24 arrive at the heart and vascular at 8:30 AM'.  Will need someone to drive him back home, so patient needs to be n.p.o. 8 hours prior to the appointment of the bone marrow biopsy.  Patient understands it and has all the information.

## 2023-05-24 ENCOUNTER — Telehealth: Payer: Self-pay | Admitting: *Deleted

## 2023-05-24 NOTE — Telephone Encounter (Signed)
 I called the pt and said the next appt  is Friday 1/31 at 9:30a an arrive at 8:30a? . I told pt that he stills need to NPO 8 hours before the prcedure, has to have someone to take him home. He has all that info just taking and changing date 1/31 per patient request

## 2023-05-26 ENCOUNTER — Telehealth: Payer: Self-pay

## 2023-05-26 NOTE — Telephone Encounter (Signed)
Dr. Carrie Mew called in about a mutual patient that him and Dr. Allegra Lai for his liver disease and Wilson disease. He thinks he has an upcoming EGD schedule in approximately two months. He wants to see if he can discuss with Dr. Allegra Lai about adding colonoscopy at the same time of the procedure for colonic biopsies to look for microscopic colitis.

## 2023-05-26 NOTE — Telephone Encounter (Signed)
As long as pt is agreeable to undergo colonoscopy, I can perform colonoscopy to rule out microscopic colitis  RV

## 2023-05-26 NOTE — Telephone Encounter (Signed)
Called patient provider and left a detail message informing him this information

## 2023-05-27 NOTE — Telephone Encounter (Signed)
The patient called in to schedule his procedure.

## 2023-05-30 ENCOUNTER — Other Ambulatory Visit: Payer: Self-pay

## 2023-05-30 DIAGNOSIS — I8510 Secondary esophageal varices without bleeding: Secondary | ICD-10-CM

## 2023-05-30 DIAGNOSIS — Z860100 Personal history of colon polyps, unspecified: Secondary | ICD-10-CM

## 2023-05-30 MED ORDER — NA SULFATE-K SULFATE-MG SULF 17.5-3.13-1.6 GM/177ML PO SOLN: 354.0000 mL | Freq: Once | ORAL | 0 refills | Status: AC

## 2023-05-30 NOTE — Telephone Encounter (Signed)
Called and left a message for call back  

## 2023-05-30 NOTE — Addendum Note (Signed)
Addended by: Radene Knee L on: 05/30/2023 09:45 AM   Modules accepted: Orders

## 2023-05-30 NOTE — Telephone Encounter (Signed)
Patient called back and got patient schedule for 08/18/2023. Went over instructions, mailed them and sent to [employer]. Sent prep to the pharmacy

## 2023-06-03 ENCOUNTER — Ambulatory Visit: Payer: Medicare Other | Admitting: Radiology

## 2023-06-09 ENCOUNTER — Other Ambulatory Visit: Payer: Self-pay | Admitting: Student

## 2023-06-09 DIAGNOSIS — D61818 Other pancytopenia: Secondary | ICD-10-CM

## 2023-06-09 NOTE — Progress Notes (Signed)
Patient for IR Bone Marrow Biopsy on Friday 06/10/2023, I called and spoke with the patient on the phone and gave pre-procedure instructions. Pt was made aware to be here at 8:30a, NPO after MN prior to procedure as well as driver post procedure/recovery/discharge. Pt stated understanding.  Called 06/09/2023

## 2023-06-10 ENCOUNTER — Ambulatory Visit
Admission: RE | Admit: 2023-06-10 | Discharge: 2023-06-10 | Disposition: A | Discharge status: Home / Self Care | Payer: Medicare Other | Source: Ambulatory Visit | Attending: Oncology | Admitting: Oncology

## 2023-06-10 ENCOUNTER — Encounter: Payer: Self-pay | Admitting: Radiology

## 2023-06-10 ENCOUNTER — Other Ambulatory Visit: Payer: Self-pay

## 2023-06-10 DIAGNOSIS — D696 Thrombocytopenia, unspecified: Secondary | ICD-10-CM | POA: Insufficient documentation

## 2023-06-10 DIAGNOSIS — I10 Essential (primary) hypertension: Secondary | ICD-10-CM | POA: Insufficient documentation

## 2023-06-10 DIAGNOSIS — D539 Nutritional anemia, unspecified: Secondary | ICD-10-CM | POA: Diagnosis present

## 2023-06-10 DIAGNOSIS — E119 Type 2 diabetes mellitus without complications: Secondary | ICD-10-CM | POA: Insufficient documentation

## 2023-06-10 DIAGNOSIS — K2100 Gastro-esophageal reflux disease with esophagitis, without bleeding: Secondary | ICD-10-CM | POA: Insufficient documentation

## 2023-06-10 DIAGNOSIS — K7460 Unspecified cirrhosis of liver: Secondary | ICD-10-CM | POA: Insufficient documentation

## 2023-06-10 DIAGNOSIS — Z7984 Long term (current) use of oral hypoglycemic drugs: Secondary | ICD-10-CM | POA: Insufficient documentation

## 2023-06-10 DIAGNOSIS — D61818 Other pancytopenia: Secondary | ICD-10-CM

## 2023-06-10 DIAGNOSIS — D649 Anemia, unspecified: Secondary | ICD-10-CM | POA: Diagnosis not present

## 2023-06-10 HISTORY — PX: IR BONE MARROW BIOPSY & ASPIRATION: IMG5727

## 2023-06-10 LAB — CBC WITH DIFFERENTIAL/PLATELET
Abs Immature Granulocytes: 0.02 10*3/uL (ref 0.00–0.07)
Basophils Absolute: 0 10*3/uL (ref 0.0–0.1)
Basophils Relative: 1 %
Eosinophils Absolute: 0.1 10*3/uL (ref 0.0–0.5)
Eosinophils Relative: 6 %
HCT: 28.6 % — ABNORMAL LOW (ref 39.0–52.0)
Hemoglobin: 9.4 g/dL — ABNORMAL LOW (ref 13.0–17.0)
Immature Granulocytes: 1 %
Lymphocytes Relative: 24 %
Lymphs Abs: 0.6 10*3/uL — ABNORMAL LOW (ref 0.7–4.0)
MCH: 35.2 pg — ABNORMAL HIGH (ref 26.0–34.0)
MCHC: 32.9 g/dL (ref 30.0–36.0)
MCV: 107.1 fL — ABNORMAL HIGH (ref 80.0–100.0)
Monocytes Absolute: 0.2 10*3/uL (ref 0.1–1.0)
Monocytes Relative: 8 %
Neutro Abs: 1.6 10*3/uL — ABNORMAL LOW (ref 1.7–7.7)
Neutrophils Relative %: 60 %
Platelets: 64 10*3/uL — ABNORMAL LOW (ref 150–400)
RBC: 2.67 MIL/uL — ABNORMAL LOW (ref 4.22–5.81)
RDW: 17.2 % — ABNORMAL HIGH (ref 11.5–15.5)
WBC: 2.5 10*3/uL — ABNORMAL LOW (ref 4.0–10.5)
nRBC: 0 % (ref 0.0–0.2)

## 2023-06-10 LAB — GLUCOSE, CAPILLARY: Glucose-Capillary: 159 mg/dL — ABNORMAL HIGH (ref 70–99)

## 2023-06-10 MED ORDER — SODIUM CHLORIDE 0.9 % IV SOLN: INTRAVENOUS | Status: DC

## 2023-06-10 MED ORDER — MIDAZOLAM HCL 2 MG/2ML IJ SOLN
INTRAMUSCULAR | Status: AC | PRN
  Administered 2023-06-10: 1 mg via INTRAVENOUS

## 2023-06-10 MED ORDER — HEPARIN SOD (PORK) LOCK FLUSH 100 UNIT/ML IV SOLN
INTRAVENOUS | Status: AC
  Filled 2023-06-10: qty 5

## 2023-06-10 MED ORDER — MIDAZOLAM HCL 2 MG/2ML IJ SOLN
INTRAMUSCULAR | Status: AC
  Filled 2023-06-10: qty 4

## 2023-06-10 MED ORDER — FENTANYL CITRATE (PF) 100 MCG/2ML IJ SOLN
INTRAMUSCULAR | Status: AC | PRN
  Administered 2023-06-10: 50 ug via INTRAVENOUS

## 2023-06-10 MED ORDER — FENTANYL CITRATE (PF) 100 MCG/2ML IJ SOLN
INTRAMUSCULAR | Status: AC
  Filled 2023-06-10: qty 2

## 2023-06-10 NOTE — Procedures (Signed)
Vascular and Interventional Radiology Procedure Note  Patient: Jack Powell DOB: 17-Jun-1950 Medical Record Number: [id number] Note Date/Time: 06/10/23 10:35 AM   Performing Physician: Roanna Banning, MD Assistant(s): None  Diagnosis: Anemia. Thrombocytopenia   Procedure: BONE MARROW ASPIRATION and BIOPSY  Anesthesia: Conscious Sedation Complications: None Estimated Blood Loss: Minimal Specimens: Sent for Pathology  Findings:  Successful Fluoroscopy-guided bone marrow aspiration and biopsy A total of 1 cores were obtained. Hemostasis of the tract was achieved using Manual Pressure.  Plan: Bed rest for 1 hours.  See detailed procedure note with images in PACS. The patient tolerated the procedure well without incident or complication and was returned to Recovery in stable condition.    Roanna Banning, MD [facility]   Pager. [phone] Clinic. [phone]

## 2023-06-10 NOTE — H&P (Signed)
Chief Complaint: Patient was seen in consultation today for anemia  Referring Physician(s): Rao,Archana C  Supervising Physician: Roanna Banning  Patient Status: [facility] - Out-pt  History of Present Illness: Jack Powell is a 73 y.o. male with history of cirrhosis, GAVE, esophageal varices, DM, GERD, HTN, and macrocytic anemia who presents for [facility] for bone marrow biopsy at the request of Dr. Smith Robert.    Patient presents today in his usual state of health.  He has been NPO.  He does not take blood thinners.  He is accompanied by his wife who is available for care and transportation post-procedure.  He is aware of hte goals of the procedure and is agreeable to proceed.   Past Medical History:  Diagnosis Date   Anemia    Cirrhosis (HCC)    Diabetes mellitus without complication (HCC)    Esophagitis    GERD (gastroesophageal reflux disease)    History of colon polyps    Hypertension    Skin cancer    Sleep apnea    Wilson disease     Past Surgical History:  Procedure Laterality Date   CATARACT EXTRACTION W/PHACO Right 02/27/2020   Procedure: CATARACT EXTRACTION PHACO AND INTRAOCULAR LENS PLACEMENT (IOC) RIGHT DIABETIC 3.78   00:55.4 6.8%;  Surgeon: Lockie Mola, MD;  Location: [facility];  Service: Ophthalmology;  Laterality: Right;  Diabetic - oral meds   CATARACT EXTRACTION W/PHACO Left 03/12/2020   Procedure: CATARACT EXTRACTION PHACO AND INTRAOCULAR LENS PLACEMENT (IOC) LEFT DIABETIC 1.54 00:29.3 5.2%;  Surgeon: Lockie Mola, MD;  Location: [facility];  Service: Ophthalmology;  Laterality: Left;  patient prefers arrival after 10am   COLONOSCOPY     ESOPHAGEAL BANDING  01/13/2023   Procedure: ESOPHAGEAL BANDING;  Surgeon: Toney Reil, MD;  Location: [facility] ENDOSCOPY;  Service: Gastroenterology;;   ESOPHAGEAL BANDING  02/24/2023   Procedure: ESOPHAGEAL BANDING;  Surgeon: Toney Reil, MD;  Location: [facility] ENDOSCOPY;   Service: Gastroenterology;;   ESOPHAGEAL BRUSHING  04/21/2023   Procedure: ESOPHAGEAL BRUSHING;  Surgeon: Toney Reil, MD;  Location: [facility] ENDOSCOPY;  Service: Gastroenterology;;   ESOPHAGOGASTRODUODENOSCOPY N/A 01/13/2023   Procedure: ESOPHAGOGASTRODUODENOSCOPY (EGD);  Surgeon: Toney Reil, MD;  Location: [facility] ENDOSCOPY;  Service: Gastroenterology;  Laterality: N/A;   ESOPHAGOGASTRODUODENOSCOPY (EGD) WITH PROPOFOL N/A 05/05/2016   Procedure: ESOPHAGOGASTRODUODENOSCOPY (EGD) WITH PROPOFOL;  Surgeon: Scot Jun, MD;  Location: [facility] ENDOSCOPY;  Service: Endoscopy;  Laterality: N/A;   ESOPHAGOGASTRODUODENOSCOPY (EGD) WITH PROPOFOL N/A 02/24/2023   Procedure: ESOPHAGOGASTRODUODENOSCOPY (EGD) WITH PROPOFOL;  Surgeon: Toney Reil, MD;  Location: [facility] ENDOSCOPY;  Service: Gastroenterology;  Laterality: N/A;   ESOPHAGOGASTRODUODENOSCOPY (EGD) WITH PROPOFOL N/A 04/21/2023   Procedure: ESOPHAGOGASTRODUODENOSCOPY (EGD) WITH PROPOFOL;  Surgeon: Toney Reil, MD;  Location: [facility] ENDOSCOPY;  Service: Gastroenterology;  Laterality: N/A;   HOT HEMOSTASIS  01/13/2023   Procedure: HOT HEMOSTASIS (ARGON PLASMA COAGULATION/BICAP);  Surgeon: Toney Reil, MD;  Location: [facility] ENDOSCOPY;  Service: Gastroenterology;;   HOT HEMOSTASIS  02/24/2023   Procedure: HOT HEMOSTASIS (ARGON PLASMA COAGULATION/BICAP);  Surgeon: Toney Reil, MD;  Location: [facility] ENDOSCOPY;  Service: Gastroenterology;;   LIVER BIOPSY     SKIN CANCER EXCISION     TONSILLECTOMY     UVULOPALATOPHARYNGOPLASTY  2005    Allergies: Ace inhibitors, Serotonin, and Penicillins  Medications: Prior to Admission medications   Medication Sig Start Date End Date Taking? Authorizing Provider  carvedilol (COREG) 6.25 MG tablet Take 3.125 mg by mouth 2 (two) times daily  with a meal.   Yes [provider]  cholecalciferol (VITAMIN D) 1000 units tablet Take 1,000 Units by mouth daily.   Yes [provider]  donepezil (ARICEPT) 5 MG tablet Take 5 mg by mouth at bedtime.   Yes [provider]  empagliflozin (JARDIANCE) 25 MG TABS tablet Take 25 mg by mouth daily.   Yes [provider]  ferrous gluconate (FERGON) 324 MG tablet Take 324 mg by mouth daily with breakfast. 09/02/21 07/15/23 Yes [provider]  ketoconazole (NIZORAL) 2 % shampoo Apply 1 Application topically every other day. 06/28/22  Yes [provider]  Magnesium 250 MG TABS Take by mouth daily.   Yes [provider]  MULTIPLE VITAMIN PO Take 1 tablet by mouth daily.    Yes [provider]  omeprazole (PRILOSEC) 40 MG capsule Take 1 capsule (40 mg total) by mouth 2 (two) times daily before a meal. 04/21/23 01/16/24 Yes Vanga, Loel Dubonnet, MD  potassium chloride (KLOR-CON) 8 MEQ tablet SMARTSIG:1.0 Tablet(s) By Mouth Daily   Yes [provider]  rifaximin (XIFAXAN) 550 MG TABS tablet Take 550 mg by mouth 2 (two) times daily.   Yes [provider]  tamsulosin (FLOMAX) 0.4 MG CAPS capsule 0.4 mg daily. 05/11/22  Yes [provider]  triamterene-hydrochlorothiazide (MAXZIDE-25) 37.5-25 MG tablet Take 1 tablet by mouth daily.   Yes [provider]  Trientine HCl (SYPRINE) 250 MG CAPS Take 4 capsules by mouth daily.    Yes [provider]  benzonatate (TESSALON) 200 MG capsule SMARTSIG:1 Capsule(s) By Mouth 1-3 Times Daily PRN 05/08/23   [provider]  lactulose, encephalopathy, (CHRONULAC) 10 GM/15ML SOLN Take 30 g by mouth 3 (three) times daily.    [provider]  LAGEVRIO 200 MG CAPS capsule Take 4 capsules by mouth 2 (two) times daily. 05/11/23   [provider]  ondansetron (ZOFRAN-ODT) 4 MG disintegrating tablet Take 4 mg by mouth every 8 (eight) hours as needed. 01/21/23   [provider]  pantoprazole (PROTONIX) 40 MG tablet Take by mouth. Patient not taking: Reported on 04/27/2023 01/16/21    [provider]  PARoxetine (PAXIL) 20 MG tablet Take by mouth. Patient not taking: Reported on 04/27/2023 04/06/23 04/05/24  [provider]     Family History  Problem Relation Age of Onset   Diabetes Mother    Heart disease Mother    Cancer Father        colon   Heart attack Father     Social History   Socioeconomic History   Marital status: Married    Spouse name: Not on file   Number of children: Not on file   Years of education: Not on file   Highest education level: Not on file  Occupational History   Not on file  Tobacco Use   Smoking status: Never   Smokeless tobacco: Never  Vaping Use   Vaping status: Never Used  Substance and Sexual Activity   Alcohol use: No   Drug use: No   Sexual activity: Not Currently  Other Topics Concern   Not on file  Social History Narrative   Live with Marisue Humble, wife.  One pet, dog, Willow.   Social Drivers of Corporate investment banker Strain: Low Risk  (05/05/2023)   Received from [facility]   Overall Financial Resource Strain (CARDIA)    Difficulty of Paying Living Expenses: Not hard at all  Food Insecurity: No Food  Insecurity (05/05/2023)   Received from [facility]   Hunger Vital Sign    Worried About Running Out of Food in the Last Year: Never true    Ran Out of Food in the Last Year: Never true  Transportation Needs: No Transportation Needs (05/05/2023)   Received from [facility] - Transportation    In the past 12 months, has lack of transportation kept you from medical appointments or from getting medications?: No    Lack of Transportation (Non-Medical): No  Physical Activity: Insufficiently Active (11/23/2022)   Received from [facility]   Exercise Vital Sign    Days of Exercise per Week: 2 days    Minutes of Exercise per Session: 20 min  Stress: No Stress Concern Present (11/23/2022)   Received from [facility] - Occupational Stress Questionnaire    Feeling of Stress : Not at all  Social Connections: Socially Integrated (11/23/2022)   Received from [facility]   Social Network    How would you rate your social network (family, work, friends)?: Good participation with social networks     Review of Systems: A 12 point ROS discussed and pertinent positives are indicated in the HPI above.  All other systems are negative.  Review of Systems  Constitutional:  Negative for fatigue and fever.  Respiratory:  Negative for cough and shortness of breath.   Cardiovascular:  Negative for chest pain.  Gastrointestinal:  Negative for abdominal pain, nausea and vomiting.  Musculoskeletal:  Negative for back pain.  Skin:  Positive for pallor.  Psychiatric/Behavioral:  Negative for behavioral problems and confusion.     Vital Signs: BP (!) 147/60   Pulse 64   Temp 98 F (36.7 C) (Oral)   Resp 10   Ht 6' (1.829 m)   Wt 136 lb 1.6 oz (61.7 kg)   SpO2 97%   BMI 18.46 kg/m   Physical Exam Vitals and nursing note reviewed.  Constitutional:      General: He is not in acute distress.    Appearance: Normal appearance. He is not ill-appearing.  HENT:     Mouth/Throat:     Mouth: Mucous membranes are moist.     Pharynx: Oropharynx is clear.  Cardiovascular:     Rate and Rhythm: Normal rate and regular rhythm.     Pulses: Normal pulses.  Pulmonary:     Effort: Pulmonary effort is normal. No respiratory distress.     Breath sounds: Normal breath sounds.  Abdominal:     General: Abdomen is flat. There is no distension.     Palpations: Abdomen is soft.  Skin:    General: Skin is warm and dry.  Neurological:     General: No focal deficit present.     Mental Status: He is alert and oriented to person, place, and time. Mental status is at baseline.  Psychiatric:        Mood and Affect: Mood normal.        Behavior: Behavior normal.        Thought Content: Thought  content normal.        Judgment: Judgment normal.      MD Evaluation Airway: WNL Heart: WNL Abdomen: WNL Chest/ Lungs: WNL ASA  Classification: 3 Mallampati/Airway Score: Two   Imaging: No results found.  Labs:  CBC: Recent Labs    01/14/23 0429 01/15/23 0945 05/23/23 1017 06/10/23 0918  WBC 3.5* 3.7*  2.8* 2.5*  HGB 9.6* 9.5* 9.2* 9.4*  HCT 28.2* 28.3* 28.2* 28.6*  PLT 72* 75* 78* 64*    COAGS: Recent Labs    01/13/23 1504  INR 1.4*    BMP: Recent Labs    01/12/23 1246 01/13/23 0444 01/14/23 0429 01/15/23 0945  NA 141 143 141 137  K 3.4* 3.5 3.5 4.0  CL 106 110 109 104  CO2 26 24 25 25   GLUCOSE 167* 93 136* 234*  BUN 27* 23 22 17   CALCIUM 9.1 7.9* 7.6* 7.4*  CREATININE 0.60* 0.64 0.65 0.69  GFRNONAA >60 >60 >60 >60    LIVER FUNCTION TESTS: Recent Labs    01/12/23 1246  BILITOT 1.6*  AST 37  ALT 35  ALKPHOS 95  PROT 6.1*  ALBUMIN 3.8    TUMOR MARKERS: No results for input(s): "AFPTM", "CEA", "CA199", "CHROMGRNA" in the last 8760 hours.  Assessment and Plan: Patient with past medical history of GAVE, HTN, esophageal varices presents with complaint of anemia.  IR consulted for bone marrow biopsy at the request of Dr. Smith Robert. Case reviewed by Dr. Milford Cage who approves patient for procedure.  Patient presents today in their usual state of health.  He has been NPO and is not currently on blood thinners.   Risks and benefits biopsy was discussed with the patient and/or patient's family including, but not limited to bleeding, infection, damage to adjacent structures or low yield requiring additional tests.  All of the questions were answered and there is agreement to proceed.  Consent signed and in chart.  Advance Care Plan: The advanced care plan/surrogate decision maker was discussed at the time of visit and documented in the medical record.     Thank you for this interesting consult.  I greatly enjoyed meeting LAVEL RIEMAN and  look forward to participating in their care.  A copy of this report was sent to the requesting provider on this date.  Electronically Signed: Hoyt Koch, PA 06/10/2023, 10:06 AM   I spent a total of  30 Minutes   in face to face in clinical consultation, greater than 50% of which was counseling/coordinating care for macrocytic anemia.

## 2023-06-13 LAB — SURGICAL PATHOLOGY

## 2023-06-14 ENCOUNTER — Telehealth: Payer: Self-pay | Admitting: *Deleted

## 2023-06-14 NOTE — Telephone Encounter (Signed)
 RN placed call to [facility] bone marrow lab to add on testing to recent bone marrow biopsy specimen. MD request to add on MDS Fish panel and Myeloid Mutation (NGS) testing. Both tests were able to be added on, results of MDS Fish will take approximately 1 week, results of NGS testing will take approximately 2 weeks.

## 2023-06-17 ENCOUNTER — Encounter (HOSPITAL_COMMUNITY): Payer: Self-pay | Admitting: Oncology

## 2023-06-20 ENCOUNTER — Encounter (HOSPITAL_COMMUNITY): Payer: Self-pay | Admitting: Oncology

## 2023-06-24 ENCOUNTER — Encounter (HOSPITAL_COMMUNITY): Payer: Self-pay | Admitting: Oncology

## 2023-06-27 ENCOUNTER — Inpatient Hospital Stay: Payer: Medicare Other | Attending: Oncology | Admitting: Oncology

## 2023-06-27 ENCOUNTER — Encounter: Payer: Self-pay | Admitting: Oncology

## 2023-06-27 VITALS — BP 128/64 | HR 60 | Temp 96.0°F | Resp 18 | Wt 141.5 lb

## 2023-06-27 DIAGNOSIS — D469 Myelodysplastic syndrome, unspecified: Secondary | ICD-10-CM | POA: Diagnosis not present

## 2023-06-27 DIAGNOSIS — D61818 Other pancytopenia: Secondary | ICD-10-CM | POA: Diagnosis not present

## 2023-06-27 DIAGNOSIS — Z79899 Other long term (current) drug therapy: Secondary | ICD-10-CM | POA: Insufficient documentation

## 2023-06-27 DIAGNOSIS — K7469 Other cirrhosis of liver: Secondary | ICD-10-CM | POA: Diagnosis not present

## 2023-06-27 NOTE — Progress Notes (Signed)
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 He has been referred for anemia.  CBC on 04/26/2023 showed white count of 2.8, H&H of 9.7/28.9 with an MCV of 107.4 and a platelet count of 67.  Iron studies were normal with an iron saturation of 27% with a ferritin level of 84.  CMP showed elevated bilirubin of 2 with a normal ALK phosphatase AST ALT.  aFP normal at 5.1.  Looking back at his prior CBCs patient has had Chronic thrombocytopenia with a platelet count has remained stable between 60s to 70s over the last 1 year.  White cell count has been fluctuating between 2.5-4 predominantly with lymphopenia.  His hemoglobin was 12 back in November 2022 and dropped to 10.8 in March 2023.  He has had persistent macrocytosis.  Patient has been on syprine medication for many years as a chelating agent.  He was started on Xifaxan for encephalopathy in the last couple of years   Appetite has been generally good but he has still been losing weight.  He has been trying to keep up with his protein intake in the form of protein shakes.  He has been getting surveillance scans for hepatocellular carcinoma surveillance and last scan was in September 2024 which showed cirrhotic liver morphology without focal hepatic lesion and evidence of splenomegaly and portal hypertension.  He is a lifelong non-smoker   Blood work from 04/27/2023 were  as follows: B12 folate TSH normal.  Myeloma panel showed no M protein and serum free light chain ratio was normal.  LDH mildly elevated at 282.  Haptoglobin less than 10.  Reticulocyte count normal at 3.1.  Bone marrow biopsy on 06/10/2019 if showed hypercellular bone marrow with trilineage hematopoiesis and relative abundance of erythroid precursors.  Dyspoietic changes primary involving megakaryocytic and erythroid cell lines with no increase in blastic cells.  Decreased iron stores.  Overall changes are limited and may be secondary in nature in the setting of liver disease.  Early involvement of MDS not excluded.  Cytogenetics were normal.  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  Denies any other new complaints at this time  ECOG PS- 2 Pain scale- 0   Review of systems- Review of Systems  Constitutional:  Negative for chills, fever, malaise/fatigue and weight loss.  HENT:  Negative for congestion, ear discharge and nosebleeds.   Eyes:  Negative for blurred vision.  Respiratory:  Negative for cough, hemoptysis, sputum production, shortness of breath and wheezing.   Cardiovascular:  Negative for chest pain, palpitations, orthopnea and claudication.  Gastrointestinal:  Negative for abdominal pain, blood in stool, constipation, diarrhea, heartburn, melena, nausea and vomiting.  Genitourinary:  Negative for dysuria, flank pain, frequency, hematuria and urgency.  Musculoskeletal:  Negative for back pain, joint pain and myalgias.  Skin:  Negative for rash.  Neurological:  Negative for dizziness, tingling,  focal weakness, seizures, weakness and headaches.  Endo/Heme/Allergies:  Does not bruise/bleed easily.  Psychiatric/Behavioral:  Negative for depression and suicidal ideas. The patient does not have insomnia.       Allergies  Allergen Reactions   Ace Inhibitors Cough    Serotonin Other (See Comments)    hallucinations   Penicillins Rash     Past Medical History:  Diagnosis Date   Anemia    Cirrhosis (HCC)    Diabetes mellitus without complication (HCC)    Esophagitis    GERD (gastroesophageal reflux disease)    History of colon polyps    Hypertension    Skin cancer    Sleep apnea    Wilson disease      Past Surgical History:  Procedure Laterality Date   CATARACT EXTRACTION W/PHACO Right 02/27/2020   Procedure: CATARACT EXTRACTION PHACO AND INTRAOCULAR LENS PLACEMENT (IOC) RIGHT DIABETIC 3.78   00:55.4 6.8%;  Surgeon: Lockie Mola, MD;  Location: [facility];  Service: Ophthalmology;  Laterality: Right;  Diabetic - oral meds   CATARACT EXTRACTION W/PHACO Left 03/12/2020   Procedure: CATARACT EXTRACTION PHACO AND INTRAOCULAR LENS PLACEMENT (IOC) LEFT DIABETIC 1.54 00:29.3 5.2%;  Surgeon: Lockie Mola, MD;  Location: [facility];  Service: Ophthalmology;  Laterality: Left;  patient prefers arrival after 10am   COLONOSCOPY     ESOPHAGEAL BANDING  01/13/2023   Procedure: ESOPHAGEAL BANDING;  Surgeon: Toney Reil, MD;  Location: [facility] END[facility];  Service: Gastroenterology;;   ESOPHAGEAL BANDING  02/24/2023   Procedure: ESOPHAGEAL BANDING;  Surgeon: Toney Reil, MD;  Location: [facility] ENDOSCOPY;  Service: Gastroenterology;;   ESOPHAGEAL BRUSHING  04/21/2023   Procedure: ESOPHAGEAL BRUSHING;  Surgeon: Toney Reil, MD;  Location: [facility] ENDOSCOPY;  Service: Gastroenterology;;   ESOPHAGOGASTRODUODENOSCOPY N/A 01/13/2023   Procedure: ESOPHAGOGASTRODUODENOSCOPY (EGD);  Surgeon: Toney Reil, MD;  Location: [facility] ENDOSCOPY;  Service: Gastroenterology;  Laterality: N/A;   ESOPHAGOGASTRODUODENOSCOPY (EGD) WITH PROPOFOL N/A 05/05/2016   Procedure: ESOPHAGOGASTRODUODENOSCOPY (EGD) WITH PROPOFOL;  Surgeon: Scot Jun, MD;  Location: [facility] ENDOSCOPY;  Service: Endoscopy;  Laterality: N/A;    ESOPHAGOGASTRODUODENOSCOPY (EGD) WITH PROPOFOL N/A 02/24/2023   Procedure: ESOPHAGOGASTRODUODENOSCOPY (EGD) WITH PROPOFOL;  Surgeon: Toney Reil, MD;  Location: [facility] ENDOSCOPY;  Service: Gastroenterology;  Laterality: N/A;   ESOPHAGOGASTRODUODENOSCOPY (EGD) WITH PROPOFOL N/A 04/21/2023   Procedure: ESOPHAGOGASTRODUODENOSCOPY (EGD) WITH PROPOFOL;  Surgeon: Toney Reil, MD;  Location: [facility] ENDOSCOPY;  Service: Gastroenterology;  Laterality: N/A;   HOT HEMOSTASIS  01/13/2023   Procedure: HOT HEMOSTASIS (ARGON PLASMA COAGULATION/BICAP);  Surgeon: Toney Reil, MD;  Location: [facility] ENDOSCOPY;  Service: Gastroenterology;;   HOT HEMOSTASIS  02/24/2023   Procedure: HOT HEMOSTASIS (ARGON PLASMA COAGULATION/BICAP);  Surgeon: Toney Reil, MD;  Location: [facility] ENDOSCOPY;  Service: Gastroenterology;;   IR BONE MARROW BIOPSY & ASPIRATION  06/10/2023   LIVER BIOPSY     SKIN CANCER EXCISION     TONSILLECTOMY     UVULOPALATOPHARYNGOPLASTY  2005    Social History   Socioeconomic History   Marital status: Married    Spouse name: Not on file   Number of children: Not on file   Years of education: Not on file   Highest education level: Not on file  Occupational History   Not on file  Tobacco Use   Smoking status: Never   Smokeless tobacco: Never  Vaping Use   Vaping status: Never Used  Substance and Sexual Activity   Alcohol use: No   Drug use: No  Sexual activity: Not Currently  Other Topics Concern   Not on file  Social History Narrative   Live with Marisue Humble, wife.  One pet, dog, Willow.   Social Drivers of Corporate investment banker Strain: Low Risk  (05/05/2023)   Received from [facility]   Overall Financial Resource Strain (CARDIA)    Difficulty of Paying Living Expenses: Not hard at all  Food Insecurity: No Food Insecurity (05/05/2023)   Received from [facility]   Hunger Vital Sign    Worried About Running Out of Food  in the Last Year: Never true    Ran Out of Food in the Last Year: Never true  Transportation Needs: No Transportation Needs (05/05/2023)   Received from [facility] - Transportation    In the past 12 months, has lack of transportation kept you from medical appointments or from getting medications?: No    Lack of Transportation (Non-Medical): No  Physical Activity: Insufficiently Active (11/23/2022)   Received from [facility]   Exercise Vital Sign    Days of Exercise per Week: 2 days    Minutes of Exercise per Session: 20 min  Stress: No Stress Concern Present (11/23/2022)   Received from [facility] - Occupational Stress Questionnaire    Feeling of Stress : Not at all  Social Connections: Socially Integrated (11/23/2022)   Received from [facility]   Social Network    How would you rate your social network (family, work, friends)?: Good participation with social networks  Intimate Partner Violence: Not At Risk (01/13/2023)   Humiliation, Afraid, Rape, and Kick questionnaire    Fear of Current or Ex-Partner: No    Emotionally Abused: No    Physically Abused: No    Sexually Abused: No    Family History  Problem Relation Age of Onset   Diabetes Mother    Heart disease Mother    Cancer Father        colon   Heart attack Father      Current Outpatient Medications:    benzonatate (TESSALON) 200 MG capsule, SMARTSIG:1 Capsule(s) By Mouth 1-3 Times Daily PRN, Disp: , Rfl:    carvedilol (COREG) 6.25 MG tablet, Take 3.125 mg by mouth 2 (two) times daily with a meal., Disp: , Rfl:    cholecalciferol (VITAMIN D) 1000 units tablet, Take 1,000 Units by mouth daily., Disp: , Rfl:    donepezil (ARICEPT) 5 MG tablet, Take 5 mg by mouth at bedtime., Disp: , Rfl:    empagliflozin (JARDIANCE) 25 MG TABS tablet, Take 25 mg by mouth daily., Disp: , Rfl:    ferrous gluconate (FERGON) 324 MG tablet, Take 324 mg by mouth  daily with breakfast., Disp: , Rfl:    ketoconazole (NIZORAL) 2 % shampoo, Apply 1 Application topically every other day., Disp: , Rfl:    lactulose, encephalopathy, (CHRONULAC) 10 GM/15ML SOLN, Take 30 g by mouth 3 (three) times daily., Disp: , Rfl:    LAGEVRIO 200 MG CAPS capsule, Take 4 capsules by mouth 2 (two) times daily., Disp: , Rfl:    Magnesium 250 MG TABS, Take by mouth daily., Disp: , Rfl:    MULTIPLE VITAMIN PO, Take 1 tablet by mouth daily. , Disp: , Rfl:    omeprazole (PRILOSEC) 40 MG capsule, Take 1 capsule (40 mg total) by mouth 2 (two) times daily before a meal., Disp: 180 capsule, Rfl: 2  ondansetron (ZOFRAN-ODT) 4 MG disintegrating tablet, Take 4 mg by mouth every 8 (eight) hours as needed., Disp: , Rfl:    pantoprazole (PROTONIX) 40 MG tablet, Take by mouth. (Patient not taking: Reported on 04/27/2023), Disp: , Rfl:    PARoxetine (PAXIL) 20 MG tablet, Take by mouth. (Patient not taking: Reported on 04/27/2023), Disp: , Rfl:    potassium chloride (KLOR-CON) 8 MEQ tablet, SMARTSIG:1.0 Tablet(s) By Mouth Daily, Disp: , Rfl:    rifaximin (XIFAXAN) 550 MG TABS tablet, Take 550 mg by mouth 2 (two) times daily., Disp: , Rfl:    tamsulosin (FLOMAX) 0.4 MG CAPS capsule, 0.4 mg daily., Disp: , Rfl:    triamterene-hydrochlorothiazide (MAXZIDE-25) 37.5-25 MG tablet, Take 1 tablet by mouth daily., Disp: , Rfl:    Trientine HCl (SYPRINE) 250 MG CAPS, Take 4 capsules by mouth daily. , Disp: , Rfl:   Physical exam:  Vitals:   06/27/23 1356  BP: 128/64  Pulse: 60  Resp: 18  Temp: (!) 96 F (35.6 C)  TempSrc: Tympanic  SpO2: 98%  Weight: 141 lb 8 oz (64.2 kg)   Physical Exam Cardiovascular:     Rate and Rhythm: Normal rate and regular rhythm.     Heart sounds: Normal heart sounds.  Pulmonary:     Effort: Pulmonary effort is normal.     Breath sounds: Normal breath sounds.  Skin:    General: Skin is warm and dry.  Neurological:     Mental Status: He is alert and oriented to  person, place, and time.         Latest Ref Rng & Units 01/15/2023    9:45 AM  CMP  Glucose 70 - 99 mg/dL 161   BUN 8 - 23 mg/dL 17   Creatinine 0.96 - 1.24 mg/dL 0.45   Sodium 409 - 811 mmol/L 137   Potassium 3.5 - 5.1 mmol/L 4.0   Chloride 98 - 111 mmol/L 104   CO2 22 - 32 mmol/L 25   Calcium 8.9 - 10.3 mg/dL 7.4       Latest Ref Rng & Units 06/10/2023    9:18 AM  CBC  WBC 4.0 - 10.5 K/uL 2.5   Hemoglobin 13.0 - 17.0 g/dL 9.4   Hematocrit 91.4 - 52.0 % 28.6   Platelets 150 - 400 K/uL 64     No images are attached to the encounter.  IR BONE MARROW BIOPSY & ASPIRATION Result Date: 06/10/2023 INDICATION: Macrocytic anemia.  Thrombocytopenia EXAM: FLUOROSCOPIC GUIDED BONE MARROW BIOPSY AND ASPIRATION MEDICATIONS: None FLUOROSCOPY TIME:  Fluoroscopic dose; 1 mGy ANESTHESIA/SEDATION: Moderate (conscious) sedation was employed during this procedure. A total of Versed 1 mg and Fentanyl 50 mcg was administered intravenously. Moderate Sedation Time: 10 minutes. The patient's level of consciousness and vital signs were monitored continuously by radiology nursing throughout the procedure under my direct supervision. COMPLICATIONS: None immediate. PROCEDURE: Informed consent was obtained from the patient following an explanation of the procedure, risks, benefits and alternatives. The patient understands, agrees and consents for the procedure. All questions were addressed. A time out was performed prior to the initiation of the procedure. The patient was positioned prone on the fluoroscopy table and the posterior aspect of the RIGHT iliac crest was marked fluoroscopically. The operative site was prepped and draped in the usual sterile fashion. Under sterile conditions and local anesthesia, an 11 gauge coaxial bone biopsy needle was advanced into the posterior aspect of the RIGHT iliac marrow space under intermittent fluoroscopic guidance. Multiple fluoroscopic  images were saved procedural  documentation purposes. Initially, a bone marrow aspiration was performed. Next, a bone marrow biopsy was obtained with the 11 gauge outer bone marrow device. The needle was removed and superficial hemostasis was obtained with manual compression. A dressing was applied. The patient tolerated the procedure well without immediate post procedural complication. IMPRESSION: Successful fluoroscopic guided RIGHT iliac bone marrow aspiration and core biopsy. Roanna Banning, MD [facility] Electronically Signed   By: Roanna Banning M.D.   On: 06/10/2023 11:19     Assessment and plan- Patient is a 73 y.o. male with history of pancytopenia here to discuss bone marrow biopsy results and further management  Patient has had pancytopeniaPatient has had chronic pancytopenia mainly with leukopenia and thrombocytopenia at least dating back to 2016.  However upon withdrawal September 2024 his hemoglobin was close to 12 but following that his hemoglobin drifted down to 9.5.  There has been no significant change in his hemoglobin since September 2024.  Results of peripheral blood anemia workup have been unremarkable.  I discussed the results of bone marrow biopsy with the patient which showed hypercellular bone marrow with trilineage hematopoiesis and abundant erythroid precursors.  There was some dyspoiesis noted in megakaryocytic and erythroid cell lines.  Overall changes were nonspecific and could be secondary to cirrhosis versus myelodysplastic syndrome.  Given that his myeloid mutation panel showed TET2 mutation there is a suggestion of a clonal hemopoietic process that could explain his pancytopenia to some extent.  His white count and platelets are more or less similar to what they have been given 5 years ago.  I recommend watchful monitoring of his anemia and consideration for EPO should his hemoglobin drift down to less than 9.  Discussed risks and benefits of each  counseling all but not limited to possible risk of thromboembolic events as well as CVA and strokes when hemoglobin is attempted to be increased to more than 11.  Since his hemoglobin is close to 10 at this point I will hold off on initiating EPO.  CBC ferritin and iron studies in 2 4 and 6 months and I will see him back in 6 months   Visit Diagnosis 1. Other pancytopenia (HCC)   2. Other cirrhosis of liver (HCC)   3. Myelodysplastic syndrome (HCC)      Dr. Owens Shark, MD, MPH [facility] [phone] 06/27/2023 3:38 PM

## 2023-07-29 ENCOUNTER — Other Ambulatory Visit: Payer: Self-pay | Admitting: Internal Medicine

## 2023-07-29 ENCOUNTER — Ambulatory Visit
Admission: RE | Admit: 2023-07-29 | Discharge: 2023-07-29 | Disposition: A | Discharge status: Home / Self Care | Source: Ambulatory Visit | Attending: Internal Medicine | Admitting: Internal Medicine

## 2023-07-29 DIAGNOSIS — G959 Disease of spinal cord, unspecified: Secondary | ICD-10-CM

## 2023-08-11 ENCOUNTER — Telehealth: Payer: Self-pay

## 2023-08-11 NOTE — Telephone Encounter (Signed)
 Pt requesting call back has question in ref to procedure on 08/18/2023

## 2023-08-12 NOTE — Telephone Encounter (Signed)
 okay

## 2023-08-17 ENCOUNTER — Telehealth: Payer: Self-pay

## 2023-08-18 ENCOUNTER — Encounter: Payer: Self-pay | Admitting: Gastroenterology

## 2023-08-18 ENCOUNTER — Encounter
Admission: RE | Disposition: A | Discharge status: Home / Self Care | Payer: Self-pay | Source: Home / Self Care | Attending: Gastroenterology

## 2023-08-18 ENCOUNTER — Ambulatory Visit: Admitting: Anesthesiology

## 2023-08-18 ENCOUNTER — Ambulatory Visit
Admission: RE | Admit: 2023-08-18 | Discharge: 2023-08-18 | Disposition: A | Discharge status: Home / Self Care | Payer: Medicare Other | Attending: Gastroenterology | Admitting: Gastroenterology

## 2023-08-18 DIAGNOSIS — Z681 Body mass index (BMI) 19 or less, adult: Secondary | ICD-10-CM | POA: Insufficient documentation

## 2023-08-18 DIAGNOSIS — I8510 Secondary esophageal varices without bleeding: Secondary | ICD-10-CM | POA: Diagnosis not present

## 2023-08-18 DIAGNOSIS — R634 Abnormal weight loss: Secondary | ICD-10-CM

## 2023-08-18 DIAGNOSIS — I8500 Esophageal varices without bleeding: Secondary | ICD-10-CM

## 2023-08-18 DIAGNOSIS — I10 Essential (primary) hypertension: Secondary | ICD-10-CM | POA: Insufficient documentation

## 2023-08-18 DIAGNOSIS — K529 Noninfective gastroenteritis and colitis, unspecified: Secondary | ICD-10-CM | POA: Diagnosis present

## 2023-08-18 DIAGNOSIS — K2970 Gastritis, unspecified, without bleeding: Secondary | ICD-10-CM | POA: Diagnosis not present

## 2023-08-18 DIAGNOSIS — G4730 Sleep apnea, unspecified: Secondary | ICD-10-CM | POA: Insufficient documentation

## 2023-08-18 DIAGNOSIS — K219 Gastro-esophageal reflux disease without esophagitis: Secondary | ICD-10-CM | POA: Diagnosis not present

## 2023-08-18 DIAGNOSIS — Z860100 Personal history of colon polyps, unspecified: Secondary | ICD-10-CM

## 2023-08-18 DIAGNOSIS — E119 Type 2 diabetes mellitus without complications: Secondary | ICD-10-CM | POA: Diagnosis not present

## 2023-08-18 DIAGNOSIS — K562 Volvulus: Secondary | ICD-10-CM

## 2023-08-18 DIAGNOSIS — I890 Lymphedema, not elsewhere classified: Secondary | ICD-10-CM | POA: Insufficient documentation

## 2023-08-18 DIAGNOSIS — K31819 Angiodysplasia of stomach and duodenum without bleeding: Secondary | ICD-10-CM

## 2023-08-18 DIAGNOSIS — Z7984 Long term (current) use of oral hypoglycemic drugs: Secondary | ICD-10-CM | POA: Diagnosis not present

## 2023-08-18 DIAGNOSIS — Z79899 Other long term (current) drug therapy: Secondary | ICD-10-CM | POA: Insufficient documentation

## 2023-08-18 HISTORY — PX: ESOPHAGOGASTRODUODENOSCOPY (EGD) WITH PROPOFOL: SHX5813

## 2023-08-18 HISTORY — PX: COLONOSCOPY WITH PROPOFOL: SHX5780

## 2023-08-18 LAB — GLUCOSE, CAPILLARY: Glucose-Capillary: 120 mg/dL — ABNORMAL HIGH (ref 70–99)

## 2023-08-18 SURGERY — COLONOSCOPY WITH PROPOFOL
Anesthesia: General

## 2023-08-18 MED ORDER — PROPOFOL 1000 MG/100ML IV EMUL
INTRAVENOUS | Status: AC
  Filled 2023-08-18: qty 100

## 2023-08-18 MED ORDER — PROPOFOL 10 MG/ML IV BOLUS
INTRAVENOUS | Status: AC
  Filled 2023-08-18: qty 40

## 2023-08-18 MED ORDER — LIDOCAINE HCL (CARDIAC) PF 100 MG/5ML IV SOSY
PREFILLED_SYRINGE | INTRAVENOUS | Status: DC | PRN
  Administered 2023-08-18: 60 mg via INTRAVENOUS

## 2023-08-18 MED ORDER — PROPOFOL 500 MG/50ML IV EMUL
INTRAVENOUS | Status: DC | PRN
Start: 2023-08-18 — End: 2023-08-18
  Administered 2023-08-18: 160 ug/kg/min via INTRAVENOUS
  Administered 2023-08-18: 120 ug/kg/min via INTRAVENOUS

## 2023-08-18 MED ORDER — PROPOFOL 10 MG/ML IV BOLUS
INTRAVENOUS | Status: DC | PRN
  Administered 2023-08-18: 40 mg via INTRAVENOUS
  Administered 2023-08-18: 30 mg via INTRAVENOUS
  Administered 2023-08-18 (×2): 40 mg via INTRAVENOUS
  Administered 2023-08-18: 90 mg via INTRAVENOUS

## 2023-08-18 MED ORDER — PROPOFOL 10 MG/ML IV BOLUS
INTRAVENOUS | Status: AC
Start: 2023-08-18 — End: ?
  Filled 2023-08-18: qty 20

## 2023-08-18 MED ORDER — SODIUM CHLORIDE 0.9 % IV SOLN
INTRAVENOUS | Status: DC
  Administered 2023-08-18: 1000 mL via INTRAVENOUS

## 2023-08-18 MED ORDER — LIDOCAINE HCL (PF) 2 % IJ SOLN
INTRAMUSCULAR | Status: AC
  Filled 2023-08-18: qty 10

## 2023-08-18 NOTE — Op Note (Signed)
 [facility] Gastroenterology Patient Name: Jack Powell Procedure Date: 08/18/2023 7:21 AM MRN: [id number] Account #: [id number] Date of Birth: 11-Jan-1951 Admit Type: Outpatient Age: 73 Room: [facility] ENDO ROOM 3 Gender: Male Note Status: Finalized Instrument Name: Upper Endoscope [id number] Procedure:             Upper GI endoscopy Indications:           Follow-up of esophageal varices, Diarrhea Providers:             Toney Reil MD, MD Referring MD:          Danella Penton, MD (Referring MD) Medicines:             General Anesthesia Complications:         No immediate complications. Estimated blood loss: None. Procedure:             Pre-Anesthesia Assessment:                        - Prior to the procedure, a History and Physical was                         performed, and patient medications and allergies were                         reviewed. The patient is competent. The risks and                         benefits of the procedure and the sedation options and                         risks were discussed with the patient. All questions                         were answered and informed consent was obtained.                         Patient identification and proposed procedure were                         verified by the physician, the nurse, the                         anesthesiologist, the anesthetist and the technician                         in the pre-procedure area in the procedure room in the                         endoscopy suite. Mental Status Examination: alert and                         oriented. Airway Examination: normal oropharyngeal                         airway and neck mobility. Respiratory Examination:                         clear to auscultation. CV Examination: normal.  Prophylactic Antibiotics: The patient does not require                         prophylactic antibiotics. Prior Anticoagulants: The                          patient has taken no anticoagulant or antiplatelet                         agents. ASA Grade Assessment: III - A patient with                         severe systemic disease. After reviewing the risks and                         benefits, the patient was deemed in satisfactory                         condition to undergo the procedure. The anesthesia                         plan was to use general anesthesia. Immediately prior                         to administration of medications, the patient was                         re-assessed for adequacy to receive sedatives. The                         heart rate, respiratory rate, oxygen saturations,                         blood pressure, adequacy of pulmonary ventilation, and                         response to care were monitored throughout the                         procedure. The physical status of the patient was                         re-assessed after the procedure.                        After obtaining informed consent, the endoscope was                         passed under direct vision. Throughout the procedure,                         the patient's blood pressure, pulse, and oxygen                         saturations were monitored continuously. The Endoscope                         was introduced through the mouth, and advanced to the  second part of duodenum. The upper GI endoscopy was                         accomplished with ease. The patient tolerated the                         procedure well. Findings:      The duodenal bulb and second portion of the duodenum were normal.       Biopsies were taken with a cold forceps for histology.      Diffuse mild inflammation was found in the entire examined stomach.       Biopsies were taken with a cold forceps for histology.      Small (< 5 mm) varices were found in the lower third of the esophagus. Impression:            - Normal duodenal bulb and  second portion of the                         duodenum. Biopsied.                        - Gastritis. Biopsied.                        - Small (< 5 mm) esophageal varices. Recommendation:        - Await pathology results.                        - Proceed with colonoscopy as scheduled                        See colonoscopy report Procedure Code(s):     --- Professional ---                        [phone], Esophagogastroduodenoscopy, flexible,                         transoral; with biopsy, single or multiple Diagnosis Code(s):     --- Professional ---                        K29.70, Gastritis, unspecified, without bleeding                        R19.7, Diarrhea, unspecified                        I85.00, Esophageal varices without bleeding CPT copyright 2022 American Medical Association. All rights reserved. The codes documented in this report are preliminary and upon coder review may  be revised to meet current compliance requirements. Dr. Libby Maw Toney Reil MD, MD 08/18/2023 8:25:09 AM This report has been signed electronically. Number of Addenda: 0 Note Initiated On: 08/18/2023 7:21 AM Estimated Blood Loss:  Estimated blood loss: none.      [facility]

## 2023-08-18 NOTE — Telephone Encounter (Signed)
 Called [facility], spoke with Erie Noe to obtain more information regarding the Hematology Risk assessment fax received.  Informed per Dr. Smith Robert she is not managing the patient anticoagulant therapy but is seeing him for his pancytopenia.  Erie Noe clarified for the clearance is there any special considerations that would need to take effect perioperatively or if the patient happened to be admitted post surgery if there are any special considerations to take note of.  Noted Dr. Lonie Peak contact number and provided to Dr. Smith Robert.

## 2023-08-18 NOTE — Anesthesia Preprocedure Evaluation (Signed)
 Anesthesia Evaluation  Patient identified by MRN, date of birth, ID band Patient awake    Reviewed: Allergy & Precautions, NPO status , Patient's Chart, lab work & pertinent test results  Airway Mallampati: I  TM Distance: >3 FB Neck ROM: full    Dental no notable dental hx.    Pulmonary sleep apnea    Pulmonary exam normal        Cardiovascular hypertension, On Medications negative cardio ROS Normal cardiovascular exam     Neuro/Psych negative neurological ROS  negative psych ROS   GI/Hepatic PUD,GERD  Medicated,,(+) Cirrhosis       Wilson disease    Endo/Other  negative endocrine ROSdiabetes    Renal/GU negative Renal ROS  negative genitourinary   Musculoskeletal   Abdominal   Peds  Hematology  (+) Blood dyscrasia, anemia   Anesthesia Other Findings Past Medical History: No date: Anemia No date: Cirrhosis (HCC) No date: Diabetes mellitus without complication (HCC) No date: Esophagitis No date: GERD (gastroesophageal reflux disease) No date: History of colon polyps No date: Hypertension No date: Skin cancer No date: Sleep apnea 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  Past Surgical History: 02/27/2020: CATARACT EXTRACTION W/PHACO; Right     Comment:  Procedure: CATARACT EXTRACTION PHACO AND INTRAOCULAR               LENS PLACEMENT (IOC) RIGHT DIABETIC 3.78   00:55.4 6.8%;               Surgeon: Lockie Mola, MD;  Location: [facility]               SURGERY CNTR;  Service: Ophthalmology;  Laterality:               Right;  Diabetic - oral meds 03/12/2020: CATARACT EXTRACTION W/PHACO; Left     Comment:  Procedure: CATARACT EXTRACTION PHACO AND INTRAOCULAR               LENS PLACEMENT (IOC) LEFT DIABETIC 1.54 00:29.3 5.2%;                Surgeon: Lockie Mola, MD;  Location: [facility]               SURGERY CNTR;  Service: Ophthalmology;  Laterality: Left;              patient prefers arrival after 10am No  date: COLONOSCOPY 01/13/2023: ESOPHAGEAL BANDING     Comment:  Procedure: ESOPHAGEAL BANDING;  Surgeon: Toney Reil, MD;  Location: [facility] ENDOSCOPY;  Service:               Gastroenterology;; 02/24/2023: ESOPHAGEAL BANDING     Comment:  Procedure: ESOPHAGEAL BANDING;  Surgeon: Toney Reil, MD;  Location: [facility] ENDOSCOPY;  Service:               Gastroenterology;; 04/21/2023: ESOPHAGEAL BRUSHING     Comment:  Procedure: ESOPHAGEAL BRUSHING;  Surgeon: Toney Reil, MD;  Location: [facility] ENDOSCOPY;  Service:               Gastroenterology;; 01/13/2023: ESOPHAGOGASTRODUODENOSCOPY; N/A     Comment:  Procedure: ESOPHAGOGASTRODUODENOSCOPY (EGD);  Surgeon:               Toney Reil, MD;  Location: [facility]  ENDOSCOPY;                Service: Gastroenterology;  Laterality: N/A; 05/05/2016: ESOPHAGOGASTRODUODENOSCOPY (EGD) WITH PROPOFOL; N/A     Comment:  Procedure: ESOPHAGOGASTRODUODENOSCOPY (EGD) WITH               PROPOFOL;  Surgeon: Scot Jun, MD;  Location: [facility]              ENDOSCOPY;  Service: Endoscopy;  Laterality: N/A; 02/24/2023: ESOPHAGOGASTRODUODENOSCOPY (EGD) WITH PROPOFOL; N/A     Comment:  Procedure: ESOPHAGOGASTRODUODENOSCOPY (EGD) WITH               PROPOFOL;  Surgeon: Toney Reil, MD;  Location:               [facility] ENDOSCOPY;  Service: Gastroenterology;  Laterality:               N/A; 04/21/2023: ESOPHAGOGASTRODUODENOSCOPY (EGD) WITH PROPOFOL; N/A     Comment:  Procedure: ESOPHAGOGASTRODUODENOSCOPY (EGD) WITH               PROPOFOL;  Surgeon: Toney Reil, MD;  Location:               [facility] ENDOSCOPY;  Service: Gastroenterology;  Laterality:               N/A; No date: EYE SURGERY 01/13/2023: HOT HEMOSTASIS     Comment:  Procedure: HOT HEMOSTASIS (ARGON PLASMA               COAGULATION/BICAP);  Surgeon: Toney Reil, MD;                Location: [facility] ENDOSCOPY;  Service:  Gastroenterology;; 02/24/2023: HOT HEMOSTASIS     Comment:  Procedure: HOT HEMOSTASIS (ARGON PLASMA               COAGULATION/BICAP);  Surgeon: Toney Reil, MD;                Location: [facility] ENDOSCOPY;  Service: Gastroenterology;; 06/10/2023: IR BONE MARROW BIOPSY & ASPIRATION No date: LIVER BIOPSY No date: SKIN CANCER EXCISION No date: TONSILLECTOMY 2005: UVULOPALATOPHARYNGOPLASTY  BMI    Body Mass Index: 16.27 kg/m      Reproductive/Obstetrics negative OB ROS                             Anesthesia Physical Anesthesia Plan  ASA: 3  Anesthesia Plan: General   Post-op Pain Management: Minimal or no pain anticipated   Induction: Intravenous  PONV Risk Score and Plan: 1 and Propofol infusion and TIVA  Airway Management Planned: Natural Airway and Nasal Cannula  Additional Equipment:   Intra-op Plan:   Post-operative Plan:   Informed Consent: I have reviewed the patients History and Physical, chart, labs and discussed the procedure including the risks, benefits and alternatives for the proposed anesthesia with the patient or authorized representative who has indicated his/her understanding and acceptance.     Dental Advisory Given  Plan Discussed with: Anesthesiologist, CRNA and Surgeon  Anesthesia Plan Comments: (Patient consented for risks of anesthesia including but not limited to:  - adverse reactions to medications - risk of airway placement if required - damage to eyes, teeth, lips or other oral mucosa - nerve damage due to positioning  - sore throat or hoarseness - Damage to heart, brain, nerves, lungs, other parts of body or loss of life  Patient voiced understanding and assent.)  Anesthesia Quick Evaluation

## 2023-08-18 NOTE — H&P (Signed)
 Arlyss Repress, MD [address]  [address]  [place], [place] [address]  Main: [phone]  Fax: [phone] Pager: [phone]  Primary Care Physician:  Danella Penton, MD Primary Gastroenterologist:  Dr. Arlyss Repress  Pre-Procedure History & Physical: HPI:  Jack Powell is a 73 y.o. male is here for an endoscopy and colonoscopy.   Past Medical History:  Diagnosis Date   Anemia    Cirrhosis (HCC)    Diabetes mellitus without complication (HCC)    Esophagitis    GERD (gastroesophageal reflux disease)    History of colon polyps    Hypertension    Skin cancer    Sleep apnea    Wilson disease     Past Surgical History:  Procedure Laterality Date   CATARACT EXTRACTION W/PHACO Right 02/27/2020   Procedure: CATARACT EXTRACTION PHACO AND INTRAOCULAR LENS PLACEMENT (IOC) RIGHT DIABETIC 3.78   00:55.4 6.8%;  Surgeon: Lockie Mola, MD;  Location: [facility] SURGERY CNTR;  Service: Ophthalmology;  Laterality: Right;  Diabetic - oral meds   CATARACT EXTRACTION W/PHACO Left 03/12/2020   Procedure: CATARACT EXTRACTION PHACO AND INTRAOCULAR LENS PLACEMENT (IOC) LEFT DIABETIC 1.54 00:29.3 5.2%;  Surgeon: Lockie Mola, MD;  Location: [facility] SURGERY CNTR;  Service: Ophthalmology;  Laterality: Left;  patient prefers arrival after 10am   COLONOSCOPY     ESOPHAGEAL BANDING  01/13/2023   Procedure: ESOPHAGEAL BANDING;  Surgeon: Toney Reil, MD;  Location: [facility] ENDOSCOPY;  Service: Gastroenterology;;   ESOPHAGEAL BANDING  02/24/2023   Procedure: ESOPHAGEAL BANDING;  Surgeon: Toney Reil, MD;  Location: [facility] ENDOSCOPY;  Service: Gastroenterology;;   ESOPHAGEAL BRUSHING  04/21/2023   Procedure: ESOPHAGEAL BRUSHING;  Surgeon: Toney Reil, MD;  Location: [facility] ENDOSCOPY;  Service: Gastroenterology;;   ESOPHAGOGASTRODUODENOSCOPY N/A 01/13/2023   Procedure: ESOPHAGOGASTRODUODENOSCOPY (EGD);  Surgeon: Toney Reil, MD;  Location: [facility] ENDOSCOPY;   Service: Gastroenterology;  Laterality: N/A;   ESOPHAGOGASTRODUODENOSCOPY (EGD) WITH PROPOFOL N/A 05/05/2016   Procedure: ESOPHAGOGASTRODUODENOSCOPY (EGD) WITH PROPOFOL;  Surgeon: Scot Jun, MD;  Location: [facility] ENDOSCOPY;  Service: Endoscopy;  Laterality: N/A;   ESOPHAGOGASTRODUODENOSCOPY (EGD) WITH PROPOFOL N/A 02/24/2023   Procedure: ESOPHAGOGASTRODUODENOSCOPY (EGD) WITH PROPOFOL;  Surgeon: Toney Reil, MD;  Location: [facility] ENDOSCOPY;  Service: Gastroenterology;  Laterality: N/A;   ESOPHAGOGASTRODUODENOSCOPY (EGD) WITH PROPOFOL N/A 04/21/2023   Procedure: ESOPHAGOGASTRODUODENOSCOPY (EGD) WITH PROPOFOL;  Surgeon: Toney Reil, MD;  Location: [facility] ENDOSCOPY;  Service: Gastroenterology;  Laterality: N/A;   HOT HEMOSTASIS  01/13/2023   Procedure: HOT HEMOSTASIS (ARGON PLASMA COAGULATION/BICAP);  Surgeon: Toney Reil, MD;  Location: [facility] ENDOSCOPY;  Service: Gastroenterology;;   HOT HEMOSTASIS  02/24/2023   Procedure: HOT HEMOSTASIS (ARGON PLASMA COAGULATION/BICAP);  Surgeon: Toney Reil, MD;  Location: [facility] ENDOSCOPY;  Service: Gastroenterology;;   IR BONE MARROW BIOPSY & ASPIRATION  06/10/2023   LIVER BIOPSY     SKIN CANCER EXCISION     TONSILLECTOMY     UVULOPALATOPHARYNGOPLASTY  2005    Prior to Admission medications   Medication Sig Start Date End Date Taking? Authorizing Provider  benzonatate (TESSALON) 200 MG capsule SMARTSIG:1 Capsule(s) By Mouth 1-3 Times Daily PRN 05/08/23   [provider]  carvedilol (COREG) 6.25 MG tablet Take 3.125 mg by mouth 2 (two) times daily with a meal.    [provider]  cholecalciferol (VITAMIN D) 1000 units tablet Take 1,000 Units by mouth daily.    [provider]  donepezil (ARICEPT) 5 MG tablet Take 5 mg by mouth  at bedtime.    [provider]  empagliflozin (JARDIANCE) 25 MG TABS tablet Take 25 mg by mouth daily.    [provider]  ferrous gluconate (FERGON) 324 MG tablet  Take 324 mg by mouth daily with breakfast. 09/02/21 07/15/23  [provider]  ketoconazole (NIZORAL) 2 % shampoo Apply 1 Application topically every other day. 06/28/22   [provider]  lactulose, encephalopathy, (CHRONULAC) 10 GM/15ML SOLN Take 30 g by mouth 3 (three) times daily.    [provider]  LAGEVRIO 200 MG CAPS capsule Take 4 capsules by mouth 2 (two) times daily. 05/11/23   [provider]  Magnesium 250 MG TABS Take by mouth daily.    [provider]  MULTIPLE VITAMIN PO Take 1 tablet by mouth daily.     [provider]  omeprazole (PRILOSEC) 40 MG capsule Take 1 capsule (40 mg total) by mouth 2 (two) times daily before a meal. 04/21/23 01/16/24  Angila Wombles, Loel Dubonnet, MD  ondansetron (ZOFRAN-ODT) 4 MG disintegrating tablet Take 4 mg by mouth every 8 (eight) hours as needed. 01/21/23   [provider]  pantoprazole (PROTONIX) 40 MG tablet Take by mouth. Patient not taking: Reported on 04/27/2023 01/16/21   [provider]  PARoxetine (PAXIL) 20 MG tablet Take by mouth. Patient not taking: Reported on 04/27/2023 04/06/23 04/05/24  [provider]  potassium chloride (KLOR-CON) 8 MEQ tablet SMARTSIG:1.0 Tablet(s) By Mouth Daily    [provider]  rifaximin (XIFAXAN) 550 MG TABS tablet Take 550 mg by mouth 2 (two) times daily.    [provider]  tamsulosin (FLOMAX) 0.4 MG CAPS capsule 0.4 mg daily. 05/11/22   [provider]  triamterene-hydrochlorothiazide (MAXZIDE-25) 37.5-25 MG tablet Take 1 tablet by mouth daily.    [provider]  Trientine HCl (SYPRINE) 250 MG CAPS Take 4 capsules by mouth daily.     [provider]    Allergies as of 05/30/2023 - Review Complete 05/23/2023  Allergen Reaction Noted   Ace inhibitors Cough 10/08/2016   Serotonin Other (See Comments) 10/31/2017   Penicillins Rash 03/15/2016    Family History  Problem Relation Age of Onset    Diabetes Mother    Heart disease Mother    Cancer Father        colon   Heart attack Father     Social History   Socioeconomic History   Marital status: Married    Spouse name: Not on file   Number of children: Not on file   Years of education: Not on file   Highest education level: Not on file  Occupational History   Not on file  Tobacco Use   Smoking status: Never   Smokeless tobacco: Never  Vaping Use   Vaping status: Never Used  Substance and Sexual Activity   Alcohol use: No   Drug use: No   Sexual activity: Not Currently  Other Topics Concern   Not on file  Social History Narrative   Live with Marisue Humble, wife.  One pet, dog, Willow.   Social Drivers of Corporate investment banker Strain: Low Risk  (08/01/2023)   Received from [facility]   Overall Financial Resource Strain (CARDIA)    Difficulty of Paying Living Expenses: Not hard at all  Food Insecurity: No Food Insecurity (08/01/2023)   Received from [facility]   Hunger Vital Sign    Worried About Running Out of Food in the Last  Year: Never true    Ran Out of Food in the Last Year: Never true  Transportation Needs: No Transportation Needs (08/01/2023)   Received from [facility] - Transportation    In the past 12 months, has lack of transportation kept you from medical appointments or from getting medications?: No    Lack of Transportation (Non-Medical): No  Physical Activity: Insufficiently Active (11/23/2022)   Received from [facility]   Exercise Vital Sign    Days of Exercise per Week: 2 days    Minutes of Exercise per Session: 20 min  Stress: No Stress Concern Present (11/23/2022)   Received from [facility] - Occupational Stress Questionnaire    Feeling of Stress : Not at all  Social Connections: Socially Integrated (11/23/2022)   Received from [facility]   Social Network    How would you  rate your social network (family, work, friends)?: Good participation with social networks  Intimate Partner Violence: Not At Risk (01/13/2023)   Humiliation, Afraid, Rape, and Kick questionnaire    Fear of Current or Ex-Partner: No    Emotionally Abused: No    Physically Abused: No    Sexually Abused: No    Review of Systems: See HPI, otherwise negative ROS  Physical Exam: BP (!) 146/66   Pulse 67   Temp (!) 97.2 F (36.2 C) (Temporal)   Resp 18   Ht 6' (1.829 m)   Wt 54.4 kg   SpO2 99%   BMI 16.27 kg/m  General:   Alert,  pleasant and cooperative in NAD Head:  Normocephalic and atraumatic. Neck:  Supple; no masses or thyromegaly. Lungs:  Clear throughout to auscultation.    Heart:  Regular rate and rhythm. Abdomen:  Soft, nontender and nondistended. Normal bowel sounds, without guarding, and without rebound.   Neurologic:  Alert and  oriented x4;  grossly normal neurologically.  Impression/Plan: Jack Powell is here for an endoscopy and colonoscopy to be performed for cirrhosis of liver and rule of microscopic colitis  Risks, benefits, limitations, and alternatives regarding  endoscopy and colonoscopy have been reviewed with the patient.  Questions have been answered.  All parties agreeable.   Lannette Donath, MD  08/18/2023, 7:35 AM

## 2023-08-18 NOTE — Op Note (Signed)
 [facility] Gastroenterology Patient Name: Jack Powell Procedure Date: 08/18/2023 7:20 AM MRN: [id number] Account #: [id number] Date of Birth: 03/18/1951 Admit Type: Outpatient Age: 73 Room: [facility] ENDO ROOM 3 Gender: Male Note Status: Finalized Instrument Name: Colonoscope [id number] Procedure:             Colonoscopy Indications:           Last colonoscopy: June 2007, Chronic diarrhea,                         Clinically significant diarrhea of unexplained origin,                         Weight loss Providers:             Toney Reil MD, MD Referring MD:          Jerene Bears (Referring MD) Medicines:             General Anesthesia Complications:         No immediate complications. Estimated blood loss: None. Procedure:             Pre-Anesthesia Assessment:                        - Prior to the procedure, a History and Physical was                         performed, and patient medications and allergies were                         reviewed. The patient is competent. The risks and                         benefits of the procedure and the sedation options and                         risks were discussed with the patient. All questions                         were answered and informed consent was obtained.                         Patient identification and proposed procedure were                         verified by the physician, the nurse, the                         anesthesiologist, the anesthetist and the technician                         in the pre-procedure area in the procedure room in the                         endoscopy suite. Mental Status Examination: alert and                         oriented. Airway Examination: normal oropharyngeal  airway and neck mobility. Respiratory Examination:                         clear to auscultation. CV Examination: normal.                         Prophylactic Antibiotics: The patient  does not require                         prophylactic antibiotics. Prior Anticoagulants: The                         patient has taken no anticoagulant or antiplatelet                         agents. ASA Grade Assessment: III - A patient with                         severe systemic disease. After reviewing the risks and                         benefits, the patient was deemed in satisfactory                         condition to undergo the procedure. The anesthesia                         plan was to use general anesthesia. Immediately prior                         to administration of medications, the patient was                         re-assessed for adequacy to receive sedatives. The                         heart rate, respiratory rate, oxygen saturations,                         blood pressure, adequacy of pulmonary ventilation, and                         response to care were monitored throughout the                         procedure. The physical status of the patient was                         re-assessed after the procedure.                        After obtaining informed consent, the colonoscope was                         passed under direct vision. Throughout the procedure,                         the patient's blood pressure, pulse, and oxygen  saturations were monitored continuously. The                         Colonoscope was introduced through the anus and                         advanced to the the terminal ileum, with                         identification of the appendiceal orifice and IC                         valve. The colonoscopy was unusually difficult due to                         significant looping. Successful completion of the                         procedure was aided by changing the patient to a                         supine position and applying abdominal pressure. The                         patient tolerated the procedure well.  The quality of                         the bowel preparation was fair. The terminal ileum,                         ileocecal valve, appendiceal orifice, and rectum were                         photographed. Findings:      The perianal and digital rectal examinations were normal. Pertinent       negatives include normal sphincter tone and no palpable rectal lesions.      The terminal ileum appeared normal. Biopsies were taken with a cold       forceps for histology.      Normal mucosa was found in the left colon and in the right colon.       Biopsies for histology were taken with a cold forceps from the entire       colon for evaluation of microscopic colitis. Impression:            - Preparation of the colon was fair.                        - The examined portion of the ileum was normal.                         Biopsied.                        - Normal mucosa in the left colon and in the right                         colon. Biopsied. Recommendation:        - Discharge patient to home (with spouse).                        -  Resume previous diet today.                        - Continue present medications.                        - Await pathology results. Procedure Code(s):     --- Professional ---                        [phone], Colonoscopy, flexible; with biopsy, single or                         multiple Diagnosis Code(s):     --- Professional ---                        K52.9, Noninfective gastroenteritis and colitis,                         unspecified                        R19.7, Diarrhea, unspecified                        R63.4, Abnormal weight loss CPT copyright 2022 American Medical Association. All rights reserved. The codes documented in this report are preliminary and upon coder review may  be revised to meet current compliance requirements. Dr. Libby Maw Toney Reil MD, MD 08/18/2023 9:01:55 AM This report has been signed electronically. Number of Addenda:  0 Note Initiated On: 08/18/2023 7:20 AM Scope Withdrawal Time: 0 hours 15 minutes 42 seconds  Total Procedure Duration: 0 hours 30 minutes 0 seconds  Estimated Blood Loss:  Estimated blood loss: none.      [facility]

## 2023-08-18 NOTE — Transfer of Care (Signed)
 Immediate Anesthesia Transfer of Care Note  Patient: Townes Fuhs Konicki  Procedure(s) Performed: COLONOSCOPY WITH PROPOFOL ESOPHAGOGASTRODUODENOSCOPY (EGD) WITH PROPOFOL  Patient Location: Endoscopy Unit  Anesthesia Type:General  Level of Consciousness: drowsy  Airway & Oxygen Therapy: Patient Spontanous Breathing  Post-op Assessment: Report given to RN and Post -op Vital signs reviewed and stable  Post vital signs: Reviewed and stable  Last Vitals:  Vitals Value Taken Time  BP 117/55   Temp 35.8 C 08/18/23 0903  Pulse 67 08/18/23 0903  Resp 12 08/18/23 0903  SpO2 100 % 08/18/23 0903    Last Pain:  Vitals:   08/18/23 0903  TempSrc: Temporal  PainSc: Asleep         Complications: No notable events documented.

## 2023-08-19 ENCOUNTER — Telehealth: Payer: Self-pay | Admitting: *Deleted

## 2023-08-19 ENCOUNTER — Encounter: Payer: Self-pay | Admitting: Gastroenterology

## 2023-08-19 ENCOUNTER — Other Ambulatory Visit: Payer: Self-pay | Admitting: Neurosurgery

## 2023-08-19 LAB — SURGICAL PATHOLOGY

## 2023-08-19 NOTE — Telephone Encounter (Signed)
 Outbound call to patient.  Informed clearance form was faxed and received by [facility].  Reviewed clearance form with patient specifically verbiage indicating Patient will be able to proceed with surgery after "getting platelet transfusion right before surgery".  Has no additional questions / concerns this time. Advised to contact the cancer center for any changes.

## 2023-08-19 NOTE — Anesthesia Postprocedure Evaluation (Signed)
 Anesthesia Post Note  Patient: Monterio Bob Rivadeneira  Procedure(s) Performed: COLONOSCOPY WITH PROPOFOL ESOPHAGOGASTRODUODENOSCOPY (EGD) WITH PROPOFOL  Patient location during evaluation: Endoscopy Anesthesia Type: General Level of consciousness: awake and alert Pain management: pain level controlled Vital Signs Assessment: post-procedure vital signs reviewed and stable Respiratory status: spontaneous breathing, nonlabored ventilation, respiratory function stable and patient connected to nasal cannula oxygen Cardiovascular status: blood pressure returned to baseline and stable Postop Assessment: no apparent nausea or vomiting Anesthetic complications: no  No notable events documented.   Last Vitals:  Vitals:   08/18/23 0913 08/18/23 0923  BP: (!) 111/45 (!) 111/55  Pulse: 67 70  Resp: 19 12  Temp:    SpO2: 99% 100%    Last Pain:  Vitals:   08/18/23 0923  TempSrc:   PainSc: 0-No pain                 Stephanie Coup

## 2023-08-19 NOTE — Telephone Encounter (Signed)
 Venessa called for Dr. Wynetta Emery and the clearance paper has already fax it already . The MD wants to have the surgery on 4/16 and we need to know about platelets going to be ok for it, or if something else to get him cleared for the surgery. They want it to have  the info fast please

## 2023-08-19 NOTE — Telephone Encounter (Signed)
 I was told that Dr. Smith Robert has the direct phone number  to Dr. Wynetta Emery. The patient wants to get it set up due to his pain and the patient wants to know to get call back . I told pt . That Smith Robert will not be in the office until 1:30 and the clearance paper is on her desk.

## 2023-08-19 NOTE — Telephone Encounter (Signed)
 Called Erie Noe at [facility] phone #[phone] ext [phone] and informed her that the Preoperative Hematology Risk Stratification was faxed over and a confirmation was received.  Erie Noe asked how many of units will the patient need prior to surgery; informed per Dr. Smith Robert she spoke with Dr. Wynetta Emery; patient will have labs done days prior to surgery and depending on the bloodwork the patient will be "getting a platelet transfusion right before surgery".  No additional questions nor additional information needed at this time.

## 2023-08-19 NOTE — Telephone Encounter (Signed)
 I spoke to Dr. Cheree Ditto this afternoon.  He will get surgery but will need platelet transfusion right before surgery and I will mention that in my clearance note as well.

## 2023-08-22 ENCOUNTER — Other Ambulatory Visit: Payer: Medicare Other

## 2023-08-23 ENCOUNTER — Telehealth: Payer: Self-pay

## 2023-08-23 ENCOUNTER — Other Ambulatory Visit: Payer: Self-pay

## 2023-08-23 ENCOUNTER — Encounter (HOSPITAL_COMMUNITY): Payer: Self-pay | Admitting: Neurosurgery

## 2023-08-23 ENCOUNTER — Telehealth: Payer: Self-pay | Admitting: *Deleted

## 2023-08-23 DIAGNOSIS — K529 Noninfective gastroenteritis and colitis, unspecified: Secondary | ICD-10-CM

## 2023-08-23 MED ORDER — PANCRELIPASE (LIP-PROT-AMYL) 36000-114000 UNITS PO CPEP: ORAL_CAPSULE | ORAL | 3 refills | Status: DC

## 2023-08-23 NOTE — Anesthesia Preprocedure Evaluation (Addendum)
 Anesthesia Evaluation  Patient identified by MRN, date of birth, ID band Patient awake    Reviewed: Allergy & Precautions, Patient's Chart, lab work & pertinent test results  History of Anesthesia Complications (+) PONV and history of anesthetic complications  Airway Mallampati: II  TM Distance: >3 FB Neck ROM: Full    Dental no notable dental hx.    Pulmonary sleep apnea (s/p UPPP 2005)    Pulmonary exam normal        Cardiovascular METS: 7 - 9 Mets hypertension, Pt. 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, plt count typically 55-81k since 2020   Anesthesia Other Findings   Reproductive/Obstetrics negative OB ROS                             Anesthesia Physical Anesthesia Plan  ASA: 3  Anesthesia Plan: General   Post-op Pain Management: Ketamine IV* and Dilaudid IV   Induction: Intravenous  PONV Risk Score and Plan: 3 and Ondansetron, Dexamethasone, Midazolam and Treatment may vary due to age or medical condition  Airway Management Planned: Oral ETT and Mask  Additional Equipment: None  Intra-op Plan:   Post-operative Plan: Extubation in OR  Informed Consent: I have reviewed the patients History and Physical, chart, labs and discussed the procedure including the risks, benefits and alternatives for the proposed anesthesia with the patient or authorized representative who has indicated his/her understanding and  acceptance.     Dental advisory given  Plan Discussed with: CRNA  Anesthesia Plan Comments: (- DOS note: Patient's wife noted confusion, altered mental status not at his baseline this morning. She also noted multiple episodes of emesis prior to arrival as well as witnessed episodes in the pre-op area. Patient also required O2NC to maintain saturations >90% which he doesn't use at baseline. Extensive conversation held with the patient, patient's wife, and Dr. Lamon Pillow as patient not appropriate candidate for surgery at this time. All in understanding, will plan to transfuse platelets and patient likely will need to be admitted for elevated medical care. )       Anesthesia Quick Evaluation

## 2023-08-23 NOTE — Progress Notes (Signed)
 SDW CALL  Patient was given pre-op instructions over the phone. The opportunity was given for the patient to ask questions. No further questions asked. Patient verbalized understanding of instructions given.   PCP - Dr. Firman Hughes Cardiologist - denies Oncologist - Yolande Hench - Rochele Christmas  PPM/ICD - denies   Chest x-ray - denies EKG - DOS   - last EKG was 01/2022 Stress Test - denies ECHO - denies Cardiac Cath - denies  Sleep Study - patient had surgery in 2005 for sleep apnea and does not have sleep apnea at this time   Fasting Blood Sugar - 140-160 Checks Blood Sugar once a day or every other day  Last dose of Jardiance was morning of 4/14- patient was aware not to take another dose prior to surgery   Blood Thinner Instructions: n/a Aspirin Instructions: n/a  ERAS Protcol -  NPO   COVID TEST-  n/a   Anesthesia review: yes  Patient denies shortness of breath, fever, cough and chest pain over the phone call   All instructions explained to the patient, with a verbal understanding of the material. Patient agrees to go over the instructions while at home for a better understanding.    Teach back method used to confirm arrival time.

## 2023-08-23 NOTE — Telephone Encounter (Signed)
 I got a another call from [place] that the patient and Dr. Annabell Key and Dr. Randy Buttery spoke and everyone is ok.

## 2023-08-23 NOTE — Telephone Encounter (Signed)
 Patient verbalized understanding of results. He states he is still have several bowel movements through out the day but sometimes they are not diarrhea. He states his main concern is the weight loss. He states he can not come pick up samples because he is having back surgery tomorrow and does not when he will be able to pick them up. He said to send the prescription to the pharmacy. He said he will pick up the stool kit when he can. He said to send a mychart message with these instructions.

## 2023-08-23 NOTE — Progress Notes (Signed)
 Anesthesia Chart Review: SAME DAY WORK-UP  Case: [id number] Date/Time: 08/24/23 0945   Procedure: LUMBAR LAMINECTOMY/DECOMPRESSION MICRODISCECTOMY 1 LEVEL (Left: Back) - Microdiscectomy - L4-L5 - left   Anesthesia type: General   Diagnosis: Herniated lumbar disc without myelopathy [M51.26]   Pre-op diagnosis: Herniated lumbar disc without myelopathy   [facility] / [facility]   Surgeons: Donalee Citrin, MD       DISCUSSION: Patient is a 73 year old male scheduled for the above procedure.  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   Last evaluated by PCP Dr. Hyacinth Meeker on 08/10/23: "Left L5 lumbar myelopathy-progressive decline, very serious, no response to anti-inflammatories, steroids, ESI other than the initial anesthesia pre-ESI. This is a surgical situation. Physical therapy is not been helpful Needs urgent neurosurgery, pulm Dr. Wynetta Emery 4/10".  He denied prior cardiac testing. He reported that prior to his back pain in March, he was walking the dog multiple times a day, mowing the lawn and never experienced any chest pain or shortness of breath. Comparison EKG from 2002 printed from [facility] and requested copy of 2023 tracing from [facility].   He had recent EGD and colonoscopy:  Colonoscopy 08/18/23: Impression: Preparation of the colon was fair. The examined portion of the ileum was normal.  Biopsied. (UNREMARKABLE COLONIC MUCOSA. NEGATIVE FOR MICROSCOPIC COLITIS, DYSPLASIA, AND MALIGNANCY.)  Normal mucosa of the left colon and in the right colon.  Biopsied. (UNREMARKABLE COLONIC MUCOSA. NEGATIVE FOR MICROSCOPIC  COLITIS, DYSPLASIA, AND MALIGNANCY.)  EGD 08/18/23: Impression: Normal duodenal bulb and second portion of the duodenum.  Biopsied. (DUODENAL MUCOSA WITH FOCAL LYMPHANGIECTASIA, NON-SPECIFIC. NEGATIVE FOR FEATURES OF CELIAC, DYSPLASIA, AND MALIGNANCY.) Gastritis.  Biopsied. (ANTRAL MUCOSA WITH REACTIVE GASTRITIS. OXYNTIC MUCOSA WITH CHANGES CONSISTENT WITH PROTON PUMP INHIBITOR EFFECT AND SUPERFICIAL EDEMA. NEGATIVE FOR H. PYLORI, INTESTINAL METAPLASIA, DYSPLASIA, AND MALIGNANCY.)  Small (< 5 mm) esophageal varices.   Dr. Lonie Peak office reached out to Dr. Smith Robert for preoperative recommendations given pancytopenia including PLT count 55-81K since ~ 10/2018. Per notes, "Patient will be able to proceed with surgery after 'getting platelet transfusion right before surgery'."  Dr. Wynetta Emery does have a transfuse pheresed platelet order in: "Units to be determined after pre-op PT/INR   Pre-op Platelet transfusion before surgery (Microdiscectomy - L4-L5 - left) on 08/24/23 with Dr. Donalee Citrin per Dr. Smith Robert".   A1c 5.9% on 04/26/23. On Jardiance and glipizide. Last Jardiance 08/22/23.   RN staff will advised him to arrive 3 hours prior to scheduled surgery since pre-operative platelet transfusion needed as well as preoperative labs and EKG. Anesthesia team to evaluate on the day of surgery.  VS:  Wt Readings from Last 3 Encounters:  08/18/23 54.4 kg  06/27/23 64.2 kg  06/10/23 61.7 kg   BP Readings from Last 3 Encounters:  08/18/23 (!) 111/55  06/27/23 128/64  06/10/23 113/80   Pulse Readings from Last 3 Encounters:  08/18/23 70  06/27/23 60  06/10/23 (!) 0     PROVIDERS: Danella Penton, MD is PCP  Milta Deiters, MD is GI (hepatologist )with [facility] and Lannette Donath, MD is GI with [place] Owens Shark, MD is HEM-[facility]   LABS: He is for labs on arrival. Last  results in [facility] include: Lab Results  Component Value Date   WBC 2.5 (L) 06/10/2023   HGB 9.4 (L) 06/10/2023   HCT 28.6 (L) 06/10/2023    PLT 64 (L) 06/10/2023   GLUCOSE 234 (H) 01/15/2023   ALT 35 01/12/2023   AST 37 01/12/2023   NA 137 01/15/2023   K 4.0 01/15/2023   CL 104 01/15/2023   CREATININE 0.69 01/15/2023   BUN 17 01/15/2023   CO2 25 01/15/2023   TSH 2.914 04/27/2023   INR 1.4 (H) 01/13/2023   A1c 5.9% on 04/26/23 ([facility]).  24 Hour Urine Copper 06/27/23 ([facility]): Copper, Urine Not Estab. ug/L 94                                 Detection Limit = 1  Creatinine, Urine 0.30 - 3.00 g/L 0.86                                 Detection Limit = 0.10  Copper/Creatinine Ratio 0 - 49 ug/g creat 109 High    Copper, 24 Hour Urine 3 - 35 ug/24 hr 119 High      IMAGES: MRI L-spine 07/29/23: IMPRESSION: - Left subarticular disc protrusion at L4-5 resulting in lateral recess narrowing. Disc protrusion likely contacts the traversing left L5 nerve roots. - No high-grade spinal canal or foraminal stenosis.  MRI Abd 05/27/23 ([facility]): IMPRESSION: 1.  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 No high risk or worrisome features.  2.  Cirrhotic liver morphology without focal lesion.  3.  Sequela of portal hypertension including splenomegaly and moderate volume ascites.  4.  Bilateral lower lobe pulmonary nodules. Recommend follow-up chest CT to further evaluate.  - Paracentesis aborted:   This was scanned in all 4 quadrants. There was not significant enough ascites in order to attempt paracentesis.   US Liver 01/14/23: IMPRESSION: 1. Patent portal vein with appropriate direction of flow. 2. Minimal perihepatic ascites and splenomegaly consistent with portal hypertension. 3. Cirrhotic liver morphology without focal hepatic lesion.      EKG: For day of surgery.    CV: N/A  Past Medical History:  Diagnosis Date   Anemia    Cirrhosis (HCC)    Diabetes mellitus without complication (HCC)    Esophagitis    GERD (gastroesophageal reflux disease)    History of colon  polyps    Hypertension    PONV (postoperative nausea and vomiting)    Skin cancer    Sleep apnea    Wilson disease     Past Surgical History:  Procedure Laterality Date   CATARACT EXTRACTION W/PHACO Right 02/27/2020   Procedure: CATARACT EXTRACTION PHACO AND INTRAOCULAR LENS PLACEMENT (IOC) RIGHT DIABETIC 3.78   00:55.4 6.8%;  Surgeon: Lockie Mola, MD;  Location: [facility];  Service: Ophthalmology;  Laterality: Right;  Diabetic - oral meds   CATARACT EXTRACTION W/PHACO Left 03/12/2020   Procedure: CATARACT EXTRACTION PHACO AND INTRAOCULAR LENS PLACEMENT (IOC) LEFT DIABETIC 1.54 00:29.3 5.2%;  Surgeon: Lockie Mola, MD;  Location: [facility];  Service: Ophthalmology;  Laterality: Left;  patient prefers arrival after 10am   COLONOSCOPY     COLONOSCOPY WITH PROPOFOL N/A 08/18/2023   Procedure: COLONOSCOPY WITH PROPOFOL;  Surgeon: Toney Reil, MD;  Location: [facility] ENDOSCOPY;  Service: Gastroenterology;  Laterality: N/A;  ESOPHAGEAL BANDING  01/13/2023   Procedure: ESOPHAGEAL BANDING;  Surgeon: Selena Daily, MD;  Location: [facility] ENDOSCOPY;  Service: Gastroenterology;;   ESOPHAGEAL BANDING  02/24/2023   Procedure: ESOPHAGEAL BANDING;  Surgeon: Selena Daily, MD;  Location: [facility] ENDOSCOPY;  Service: Gastroenterology;;   ESOPHAGEAL BRUSHING  04/21/2023   Procedure: ESOPHAGEAL BRUSHING;  Surgeon: Selena Daily, MD;  Location: [facility] ENDOSCOPY;  Service: Gastroenterology;;   ESOPHAGOGASTRODUODENOSCOPY N/A 01/13/2023   Procedure: ESOPHAGOGASTRODUODENOSCOPY (EGD);  Surgeon: Selena Daily, MD;  Location: [facility] ENDOSCOPY;  Service: Gastroenterology;  Laterality: N/A;   ESOPHAGOGASTRODUODENOSCOPY (EGD) WITH PROPOFOL N/A 05/05/2016   Procedure: ESOPHAGOGASTRODUODENOSCOPY (EGD) WITH PROPOFOL;  Surgeon: Cassie Click, MD;  Location: [facility] ENDOSCOPY;  Service: Endoscopy;  Laterality: N/A;   ESOPHAGOGASTRODUODENOSCOPY (EGD) WITH PROPOFOL N/A  02/24/2023   Procedure: ESOPHAGOGASTRODUODENOSCOPY (EGD) WITH PROPOFOL;  Surgeon: Selena Daily, MD;  Location: [facility] ENDOSCOPY;  Service: Gastroenterology;  Laterality: N/A;   ESOPHAGOGASTRODUODENOSCOPY (EGD) WITH PROPOFOL N/A 04/21/2023   Procedure: ESOPHAGOGASTRODUODENOSCOPY (EGD) WITH PROPOFOL;  Surgeon: Selena Daily, MD;  Location: [facility] ENDOSCOPY;  Service: Gastroenterology;  Laterality: N/A;   ESOPHAGOGASTRODUODENOSCOPY (EGD) WITH PROPOFOL N/A 08/18/2023   Procedure: ESOPHAGOGASTRODUODENOSCOPY (EGD) WITH PROPOFOL;  Surgeon: Selena Daily, MD;  Location: [facility] ENDOSCOPY;  Service: Gastroenterology;  Laterality: N/A;   EYE SURGERY     HOT HEMOSTASIS  01/13/2023   Procedure: HOT HEMOSTASIS (ARGON PLASMA COAGULATION/BICAP);  Surgeon: Selena Daily, MD;  Location: [facility] ENDOSCOPY;  Service: Gastroenterology;;   HOT HEMOSTASIS  02/24/2023   Procedure: HOT HEMOSTASIS (ARGON PLASMA COAGULATION/BICAP);  Surgeon: Selena Daily, MD;  Location: [facility] ENDOSCOPY;  Service: Gastroenterology;;   IR BONE MARROW BIOPSY & ASPIRATION  06/10/2023   LIVER BIOPSY     SKIN CANCER EXCISION     TONSILLECTOMY     UVULOPALATOPHARYNGOPLASTY  2005    MEDICATIONS: No current facility-administered medications for this encounter.    carvedilol (COREG) 6.25 MG tablet   cholecalciferol (VITAMIN D) 1000 units tablet   donepezil (ARICEPT) 10 MG tablet   empagliflozin (JARDIANCE) 10 MG TABS tablet   ferrous gluconate (FERGON) 324 MG tablet   gabapentin (NEURONTIN) 100 MG capsule   glipiZIDE (GLUCOTROL XL) 2.5 MG 24 hr tablet   Glycerin-Hypromellose-PEG 400 (DRY EYE RELIEF DROPS) 0.2-0.2-1 % SOLN   ketoconazole (NIZORAL) 2 % shampoo   lactulose, encephalopathy, (CHRONULAC) 10 GM/15ML SOLN   Magnesium 250 MG TABS   MULTIPLE VITAMIN PO   omeprazole (PRILOSEC) 40 MG capsule   ondansetron (ZOFRAN-ODT) 4 MG disintegrating tablet   oxyCODONE (OXY IR/ROXICODONE) 5 MG immediate release tablet    pantoprazole (PROTONIX) 40 MG tablet   potassium chloride (KLOR-CON) 8 MEQ tablet   rifaximin (XIFAXAN) 550 MG TABS tablet   tamsulosin (FLOMAX) 0.4 MG CAPS capsule   triamterene-hydrochlorothiazide (MAXZIDE-25) 37.5-25 MG tablet   Trientine HCl (SYPRINE) 250 MG CAPS   lipase/protease/amylase (CREON) 36000 UNITS CPEP capsule   PARoxetine (PAXIL) 20 MG tablet    Ella Gun, PA-C Surgical Short Stay/Anesthesiology [facility] Phone [phone] [facility] Phone [phone] 08/23/2023 1:40 PM

## 2023-08-23 NOTE — Telephone Encounter (Signed)
-----   Message from [facility] sent at 08/22/2023  3:49 PM EDT ----- Please inform patient that the pathology results from upper endoscopy and colonoscopy for workup of chronic diarrhea came back negative.  There is no evidence of microscopic colitis or inflammatory bowel disease.  Recommend to check pancreatic fecal elastase levels and empirically start pancreatic enzymes such as Creon or Zenpep 2 pills daily with first bite of each meal. He can try samples first  RV

## 2023-08-24 ENCOUNTER — Encounter (HOSPITAL_COMMUNITY)
Admission: RE | Disposition: A | Discharge status: Home / Self Care | Payer: Self-pay | Source: Home / Self Care | Attending: Internal Medicine

## 2023-08-24 ENCOUNTER — Encounter (HOSPITAL_COMMUNITY): Payer: Self-pay | Admitting: Neurosurgery

## 2023-08-24 ENCOUNTER — Observation Stay (HOSPITAL_COMMUNITY)

## 2023-08-24 ENCOUNTER — Ambulatory Visit (HOSPITAL_COMMUNITY): Admitting: Vascular Surgery

## 2023-08-24 ENCOUNTER — Ambulatory Visit (HOSPITAL_COMMUNITY)

## 2023-08-24 ENCOUNTER — Observation Stay (HOSPITAL_COMMUNITY)
Admission: RE | Admit: 2023-08-24 | Discharge: 2023-08-26 | Disposition: A | Discharge status: Home / Self Care | Attending: Internal Medicine | Admitting: Internal Medicine

## 2023-08-24 DIAGNOSIS — D649 Anemia, unspecified: Secondary | ICD-10-CM | POA: Diagnosis not present

## 2023-08-24 DIAGNOSIS — E119 Type 2 diabetes mellitus without complications: Secondary | ICD-10-CM | POA: Diagnosis not present

## 2023-08-24 DIAGNOSIS — R2689 Other abnormalities of gait and mobility: Secondary | ICD-10-CM | POA: Diagnosis not present

## 2023-08-24 DIAGNOSIS — Z85828 Personal history of other malignant neoplasm of skin: Secondary | ICD-10-CM | POA: Diagnosis not present

## 2023-08-24 DIAGNOSIS — Z7984 Long term (current) use of oral hypoglycemic drugs: Secondary | ICD-10-CM | POA: Insufficient documentation

## 2023-08-24 DIAGNOSIS — D7589 Other specified diseases of blood and blood-forming organs: Secondary | ICD-10-CM | POA: Diagnosis not present

## 2023-08-24 DIAGNOSIS — E43 Unspecified severe protein-calorie malnutrition: Secondary | ICD-10-CM | POA: Diagnosis not present

## 2023-08-24 DIAGNOSIS — R569 Unspecified convulsions: Secondary | ICD-10-CM | POA: Diagnosis not present

## 2023-08-24 DIAGNOSIS — G928 Other toxic encephalopathy: Secondary | ICD-10-CM | POA: Insufficient documentation

## 2023-08-24 DIAGNOSIS — I10 Essential (primary) hypertension: Secondary | ICD-10-CM | POA: Insufficient documentation

## 2023-08-24 DIAGNOSIS — Z79899 Other long term (current) drug therapy: Secondary | ICD-10-CM | POA: Diagnosis not present

## 2023-08-24 DIAGNOSIS — Z681 Body mass index (BMI) 19 or less, adult: Secondary | ICD-10-CM | POA: Insufficient documentation

## 2023-08-24 DIAGNOSIS — R64 Cachexia: Principal | ICD-10-CM | POA: Diagnosis present

## 2023-08-24 DIAGNOSIS — K7460 Unspecified cirrhosis of liver: Secondary | ICD-10-CM | POA: Insufficient documentation

## 2023-08-24 HISTORY — DX: Other specified postprocedural states: Z98.890

## 2023-08-24 LAB — COMPREHENSIVE METABOLIC PANEL WITH GFR
ALT: 50 U/L — ABNORMAL HIGH (ref 0–44)
AST: 49 U/L — ABNORMAL HIGH (ref 15–41)
Albumin: 3 g/dL — ABNORMAL LOW (ref 3.5–5.0)
Alkaline Phosphatase: 106 U/L (ref 38–126)
Anion gap: 15 (ref 5–15)
BUN: 28 mg/dL — ABNORMAL HIGH (ref 8–23)
CO2: 23 mmol/L (ref 22–32)
Calcium: 9 mg/dL (ref 8.9–10.3)
Chloride: 100 mmol/L (ref 98–111)
Creatinine, Ser: 0.78 mg/dL (ref 0.61–1.24)
GFR, Estimated: >60 mL/min (ref >60)
Glucose, Bld: 220 mg/dL — ABNORMAL HIGH (ref 70–99)
Potassium: 3.3 mmol/L — ABNORMAL LOW (ref 3.5–5.1)
Sodium: 138 mmol/L (ref 135–145)
Total Bilirubin: 1.6 mg/dL — ABNORMAL HIGH (ref 0.0–1.2)
Total Protein: 5 g/dL — ABNORMAL LOW (ref 6.5–8.1)

## 2023-08-24 LAB — TSH: TSH: 3.148 u[IU]/mL (ref 0.350–4.500)

## 2023-08-24 LAB — SURGICAL PCR SCREEN
MRSA, PCR: NEGATIVE
Staphylococcus aureus: NEGATIVE

## 2023-08-24 LAB — VITAMIN B12: Vitamin B-12: 1310 pg/mL — ABNORMAL HIGH (ref 180–914)

## 2023-08-24 LAB — CBC
HCT: 30.5 % — ABNORMAL LOW (ref 39.0–52.0)
Hemoglobin: 10.3 g/dL — ABNORMAL LOW (ref 13.0–17.0)
MCH: 35.4 pg — ABNORMAL HIGH (ref 26.0–34.0)
MCHC: 33.8 g/dL (ref 30.0–36.0)
MCV: 104.8 fL — ABNORMAL HIGH (ref 80.0–100.0)
Platelets: 69 10*3/uL — ABNORMAL LOW (ref 150–400)
RBC: 2.91 MIL/uL — ABNORMAL LOW (ref 4.22–5.81)
RDW: 18.1 % — ABNORMAL HIGH (ref 11.5–15.5)
WBC: 6 10*3/uL (ref 4.0–10.5)
nRBC: 0 % (ref 0.0–0.2)

## 2023-08-24 LAB — GLUCOSE, CAPILLARY
Glucose-Capillary: 219 mg/dL — ABNORMAL HIGH (ref 70–99)
Glucose-Capillary: 212 mg/dL — ABNORMAL HIGH (ref 70–99)
Glucose-Capillary: 185 mg/dL — ABNORMAL HIGH (ref 70–99)
Glucose-Capillary: 134 mg/dL — ABNORMAL HIGH (ref 70–99)
Glucose-Capillary: 129 mg/dL — ABNORMAL HIGH (ref 70–99)

## 2023-08-24 LAB — T4, FREE: Free T4: 0.88 ng/dL (ref 0.61–1.12)

## 2023-08-24 LAB — TYPE AND SCREEN
ABO/RH(D): B POS
Antibody Screen: NEGATIVE

## 2023-08-24 LAB — HEMOGLOBIN A1C
Hgb A1c MFr Bld: 6.8 % — ABNORMAL HIGH (ref 4.8–5.6)
Mean Plasma Glucose: 148.46 mg/dL

## 2023-08-24 LAB — PROTIME-INR
INR: 1.3 — ABNORMAL HIGH (ref 0.8–1.2)
Prothrombin Time: 16.3 s — ABNORMAL HIGH (ref 11.4–15.2)

## 2023-08-24 LAB — AMMONIA: Ammonia: 89 umol/L — ABNORMAL HIGH (ref 9–35)

## 2023-08-24 SURGERY — LUMBAR LAMINECTOMY/DECOMPRESSION MICRODISCECTOMY 1 LEVEL
Anesthesia: General | Site: Back | Laterality: Left

## 2023-08-24 MED ORDER — PHENYLEPHRINE 80 MCG/ML (10ML) SYRINGE FOR IV PUSH (FOR BLOOD PRESSURE SUPPORT)
PREFILLED_SYRINGE | INTRAVENOUS | Status: AC
  Filled 2023-08-24: qty 10

## 2023-08-24 MED ORDER — MIDAZOLAM HCL 2 MG/2ML IJ SOLN
INTRAMUSCULAR | Status: AC
  Filled 2023-08-24: qty 2

## 2023-08-24 MED ORDER — CEFAZOLIN SODIUM-DEXTROSE 2-4 GM/100ML-% IV SOLN
2.0000 g | INTRAVENOUS | Status: DC
  Filled 2023-08-24: qty 100

## 2023-08-24 MED ORDER — CHLORHEXIDINE GLUCONATE CLOTH 2 % EX PADS: 6.0000 | MEDICATED_PAD | Freq: Once | CUTANEOUS | Status: DC

## 2023-08-24 MED ORDER — LACTULOSE 10 GM/15ML PO SOLN: 20.0000 g | Freq: Every day | ORAL | Status: DC | PRN

## 2023-08-24 MED ORDER — ROCURONIUM BROMIDE 10 MG/ML (PF) SYRINGE
PREFILLED_SYRINGE | INTRAVENOUS | Status: AC
  Filled 2023-08-24: qty 10

## 2023-08-24 MED ORDER — ACETAMINOPHEN 650 MG RE SUPP: 650.0000 mg | RECTAL | Status: DC | PRN

## 2023-08-24 MED ORDER — CARVEDILOL 3.125 MG PO TABS
3.1250 mg | ORAL_TABLET | Freq: Two times a day (BID) | ORAL | Status: DC
  Administered 2023-08-25 – 2023-08-26 (×3): 3.125 mg via ORAL
  Filled 2023-08-24 (×4): qty 1

## 2023-08-24 MED ORDER — KETAMINE HCL 50 MG/5ML IJ SOSY
PREFILLED_SYRINGE | INTRAMUSCULAR | Status: AC
  Filled 2023-08-24: qty 5

## 2023-08-24 MED ORDER — AMISULPRIDE (ANTIEMETIC) 5 MG/2ML IV SOLN
INTRAVENOUS | Status: AC
  Administered 2023-08-24: 10 mg via INTRAVENOUS
  Filled 2023-08-24: qty 4

## 2023-08-24 MED ORDER — PROPOFOL 1000 MG/100ML IV EMUL
INTRAVENOUS | Status: AC
  Filled 2023-08-24: qty 100

## 2023-08-24 MED ORDER — KETOCONAZOLE 2 % EX SHAM
1.0000 | MEDICATED_SHAMPOO | CUTANEOUS | Status: DC
  Administered 2023-08-25: 1 via TOPICAL
  Filled 2023-08-24: qty 120

## 2023-08-24 MED ORDER — PAROXETINE HCL 20 MG PO TABS: 20.0000 mg | ORAL_TABLET | Freq: Every day | ORAL | Status: DC

## 2023-08-24 MED ORDER — POTASSIUM CHLORIDE 10 MEQ/100ML IV SOLN
10.0000 meq | INTRAVENOUS | Status: AC
Start: 2023-08-24 — End: 2023-08-24
  Administered 2023-08-24: 10 meq via INTRAVENOUS
  Filled 2023-08-24 (×2): qty 100

## 2023-08-24 MED ORDER — SODIUM CHLORIDE 0.9% IV SOLUTION: Freq: Once | INTRAVENOUS | Status: DC

## 2023-08-24 MED ORDER — PANCRELIPASE (LIP-PROT-AMYL) 36000-114000 UNITS PO CPEP
36000.0000 [IU] | ORAL_CAPSULE | Freq: Three times a day (TID) | ORAL | Status: DC
Start: 2023-08-24 — End: 2023-08-26
  Administered 2023-08-25 – 2023-08-26 (×5): 36000 [IU] via ORAL
  Filled 2023-08-24 (×7): qty 1

## 2023-08-24 MED ORDER — PROPOFOL 10 MG/ML IV BOLUS
INTRAVENOUS | Status: AC
  Filled 2023-08-24: qty 20

## 2023-08-24 MED ORDER — LIDOCAINE 2% (20 MG/ML) 5 ML SYRINGE
INTRAMUSCULAR | Status: AC
  Filled 2023-08-24: qty 5

## 2023-08-24 MED ORDER — PANTOPRAZOLE SODIUM 40 MG IV SOLR
40.0000 mg | Freq: Every day | INTRAVENOUS | Status: DC
  Administered 2023-08-24: 40 mg via INTRAVENOUS
  Filled 2023-08-24: qty 10

## 2023-08-24 MED ORDER — DONEPEZIL HCL 5 MG PO TABS
5.0000 mg | ORAL_TABLET | Freq: Every day | ORAL | Status: DC
  Administered 2023-08-24 – 2023-08-25 (×2): 5 mg via ORAL
  Filled 2023-08-24 (×2): qty 1

## 2023-08-24 MED ORDER — ONDANSETRON HCL 4 MG/2ML IJ SOLN
INTRAMUSCULAR | Status: AC
  Filled 2023-08-24: qty 2

## 2023-08-24 MED ORDER — ADULT MULTIVITAMIN W/MINERALS CH
ORAL_TABLET | Freq: Every day | ORAL | Status: DC
  Administered 2023-08-24 – 2023-08-26 (×3): 1 via ORAL
  Filled 2023-08-24 (×3): qty 1

## 2023-08-24 MED ORDER — INSULIN ASPART 100 UNIT/ML IJ SOLN
0.0000 [IU] | Freq: Three times a day (TID) | INTRAMUSCULAR | Status: DC
  Administered 2023-08-25: 8 [IU] via SUBCUTANEOUS
  Administered 2023-08-25: 5 [IU] via SUBCUTANEOUS
  Administered 2023-08-25: 3 [IU] via SUBCUTANEOUS
  Administered 2023-08-26: 11 [IU] via SUBCUTANEOUS
  Administered 2023-08-26: 2 [IU] via SUBCUTANEOUS

## 2023-08-24 MED ORDER — INSULIN ASPART 100 UNIT/ML IJ SOLN: 0.0000 [IU] | INTRAMUSCULAR | Status: DC | PRN

## 2023-08-24 MED ORDER — MENTHOL 3 MG MT LOZG: 1.0000 | LOZENGE | OROMUCOSAL | Status: DC | PRN

## 2023-08-24 MED ORDER — RIFAXIMIN 550 MG PO TABS
550.0000 mg | ORAL_TABLET | Freq: Two times a day (BID) | ORAL | Status: DC
  Administered 2023-08-24 – 2023-08-26 (×4): 550 mg via ORAL
  Filled 2023-08-24 (×6): qty 1

## 2023-08-24 MED ORDER — ALUM & MAG HYDROXIDE-SIMETH 200-200-20 MG/5ML PO SUSP: 30.0000 mL | Freq: Four times a day (QID) | ORAL | Status: DC | PRN

## 2023-08-24 MED ORDER — POLYVINYL ALCOHOL 1.4 % OP SOLN
1.0000 [drp] | Freq: Every day | OPHTHALMIC | Status: DC | PRN
Start: 2023-08-24 — End: 2023-08-26

## 2023-08-24 MED ORDER — PHENYLEPHRINE HCL-NACL 20-0.9 MG/250ML-% IV SOLN
INTRAVENOUS | Status: AC
  Filled 2023-08-24: qty 250

## 2023-08-24 MED ORDER — FENTANYL CITRATE (PF) 250 MCG/5ML IJ SOLN
INTRAMUSCULAR | Status: AC
  Filled 2023-08-24: qty 5

## 2023-08-24 MED ORDER — PHENOL 1.4 % MT LIQD: 1.0000 | OROMUCOSAL | Status: DC | PRN

## 2023-08-24 MED ORDER — ACETAMINOPHEN 325 MG PO TABS
650.0000 mg | ORAL_TABLET | ORAL | Status: DC | PRN
  Administered 2023-08-25 (×2): 650 mg via ORAL
  Filled 2023-08-24 (×3): qty 2

## 2023-08-24 MED ORDER — CHLORHEXIDINE GLUCONATE 0.12 % MT SOLN
15.0000 mL | OROMUCOSAL | Status: AC
Start: 2023-08-24 — End: 2023-08-24
  Administered 2023-08-24: 15 mL via OROMUCOSAL
  Filled 2023-08-24: qty 15

## 2023-08-24 MED ORDER — POTASSIUM CHLORIDE IN NACL 20-0.9 MEQ/L-% IV SOLN
INTRAVENOUS | Status: AC
  Filled 2023-08-24: qty 1000

## 2023-08-24 MED ORDER — LACTULOSE 10 GM/15ML PO SOLN
10.0000 g | Freq: Every day | ORAL | Status: DC
  Administered 2023-08-24: 10 g via ORAL
  Filled 2023-08-24: qty 15

## 2023-08-24 MED ORDER — CEFAZOLIN SODIUM-DEXTROSE 2-4 GM/100ML-% IV SOLN: 2.0000 g | INTRAVENOUS | Status: DC

## 2023-08-24 MED ORDER — ONDANSETRON 4 MG PO TBDP: 4.0000 mg | ORAL_TABLET | Freq: Three times a day (TID) | ORAL | Status: DC | PRN

## 2023-08-24 MED ORDER — OXYCODONE HCL 5 MG PO TABS
5.0000 mg | ORAL_TABLET | Freq: Two times a day (BID) | ORAL | Status: DC
  Administered 2023-08-24 – 2023-08-25 (×3): 5 mg via ORAL
  Filled 2023-08-24 (×2): qty 1

## 2023-08-24 MED ORDER — DEXAMETHASONE SODIUM PHOSPHATE 10 MG/ML IJ SOLN
INTRAMUSCULAR | Status: AC
  Filled 2023-08-24: qty 1

## 2023-08-24 MED ORDER — GABAPENTIN 100 MG PO CAPS
100.0000 mg | ORAL_CAPSULE | Freq: Two times a day (BID) | ORAL | Status: DC
  Administered 2023-08-25 – 2023-08-26 (×3): 100 mg via ORAL
  Filled 2023-08-24 (×4): qty 1

## 2023-08-24 MED ORDER — TAMSULOSIN HCL 0.4 MG PO CAPS
0.4000 mg | ORAL_CAPSULE | Freq: Every day | ORAL | Status: DC
  Administered 2023-08-25 – 2023-08-26 (×2): 0.4 mg via ORAL
  Filled 2023-08-24 (×2): qty 1

## 2023-08-24 MED ORDER — AMISULPRIDE (ANTIEMETIC) 5 MG/2ML IV SOLN
10.0000 mg | Freq: Once | INTRAVENOUS | Status: AC
  Filled 2023-08-24: qty 4

## 2023-08-24 NOTE — Progress Notes (Signed)
 Called report to Shelvy Dickens, RN on 5N. Verbalized understanding. Patient transferred to unit in safe and stable condition.

## 2023-08-24 NOTE — Progress Notes (Signed)
 Patient currently in CT. EEG to be performed when patient returns to floor.

## 2023-08-24 NOTE — Procedures (Signed)
 Patient Name: Jack Powell  MRN: [id number]  Epilepsy Attending: Arleene Lack  Referring Physician/Provider: Lavanda Porter, MD  Date: 08/24/2023 Duration: 24.08 mins  Patient history: 72yo m with ams. EEG to evaluate for seizure  Level of alertness: Awake  AEDs during EEG study: GBP  Technical aspects: This EEG study was done with scalp electrodes positioned according to the 10-20 International system of electrode placement. Electrical activity was reviewed with band pass filter of 1-70Hz , sensitivity of 7 uV/mm, display speed of 64mm/sec with a 60Hz  notched filter applied as appropriate. EEG data were recorded continuously and digitally stored.  Video monitoring was available and reviewed as appropriate.  Description: EEG showed continuous generalized polymorphic 3 to 5 Hz theta-delta slowing, at times with triphasic morphology. Hyperventilation and photic stimulation were not performed.     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 No seizures or definite epileptiform discharges were seen throughout the recording.  Huong Luthi O Lindaann Gradilla

## 2023-08-24 NOTE — H&P (Addendum)
 History and Physical    Patient: Jack Powell ZOX:[id number] DOB: [birth year] DOA: 08/24/2023 DOS: the patient was seen and examined on 08/24/2023 PCP: Danella Penton, MD  Patient coming from:  Short stay Chief complaint: No chief complaint on file.  HPI:  Jack Powell is a 73 y.o. 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 Per wife pt woke up confused.got him bed and he took PJ off and started walking saying getting socks.  Wife got him in the bathroom and asked him orientation question and didn't. know answers and got frustrated. Pt vomited in the car, and food debris and color unknown. Pt hadn't  eaten this am.  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  Patient was seen last week in neurosurgery clinic for left leg pain and foot.  Patient had been progressively looking thinner and had weight loss of 20 pounds in the past 3 weeks.  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  Patient was seen by hematology for preop clearance and was advised to get platelet transfusion before the surgery.Patient had colonoscopy on 10 April which showed fair prep and normal mucosa of the colon biopsies pending.   Pt in ed at bedside  is A/awake but disoriented patient is cachectic. Vital signs in the ED were notable for the following:  Vitals:   08/24/23 1107 08/24/23 1153 08/24/23 1421 08/24/23 1941  BP: (!) 141/67 136/78 128/68 (!) 127/57  Pulse: 82 86 85 85  Temp: 98.7 F (37.1 C) 98.8 F (37.1 C) 98.2 F (36.8 C) 98.8 F (37.1 C)  Resp:  18 18 16 17   Height:      Weight:      SpO2: 97% 97% 98% 94%  TempSrc: Oral Oral Oral   BMI (Calculated):      EKG sinus rhythm at 83 with a PR of 146, QTc of 438, no ST-T wave changes. >> Lab work so far shows hypokalemia of 3.3 glucose 220 normal kidney function mild AST ALT elevation at 49 and 50 total with 18 at 5 protein total bili at 1.6.Marland Kitchen -PT/INR of 16.3/1.3 - Glucose 220 A1c ordered and pending.  >>While in the ED patient received the following: Medications  rifaximin (XIFAXAN) tablet 550 mg (550 mg Oral Not Given 08/24/23 1530)  lactulose (CHRONULAC) 10 GM/15ML solution 20 g (has no administration in time range)  ketoconazole (NIZORAL) 2 % shampoo 1 Application (has no administration in time range)  tamsulosin (FLOMAX) capsule 0.4 mg (0.4 mg Oral Not Given 08/24/23 1530)  carvedilol (COREG) tablet 3.125 mg (3.125 mg Oral Not Given 08/24/23 1803)  donepezil (ARICEPT) tablet 5 mg (has no administration in time range)  ondansetron (ZOFRAN-ODT) disintegrating tablet 4 mg (has no administration in time range)  oxyCODONE (Oxy IR/ROXICODONE) immediate release tablet 5 mg (has no administration in time range)  gabapentin (NEURONTIN) capsule 100 mg (has no administration in time range)  polyvinyl alcohol (LIQUIFILM TEARS) 1.4 % ophthalmic solution 1 drop (has no administration in time range)  lipase/protease/amylase (CREON) capsule 36,000 Units (36,000 Units Oral Not Given 08/24/23 1804)  multivitamin with minerals tablet (has no administration in time  range)  acetaminophen (TYLENOL) tablet 650 mg (has no administration in time range)    Or  acetaminophen (TYLENOL) suppository 650 mg (has no administration in time range)  alum & mag hydroxide-simeth (MAALOX/MYLANTA) 200-200-20 MG/5ML suspension 30 mL (has no administration in time range)  pantoprazole (PROTONIX) injection 40 mg (has no administration in time range)  menthol-cetylpyridinium (CEPACOL) lozenge 3 mg (has no administration in  time range)    Or  phenol (CHLORASEPTIC) mouth spray 1 spray (has no administration in time range)  0.9 % NaCl with KCl 20 mEq/ L  infusion (has no administration in time range)  insulin aspart (novoLOG) injection 0-15 Units ( Subcutaneous Not Given 08/24/23 1803)  potassium chloride 10 mEq in 100 mL IVPB (10 mEq Intravenous New Bag/Given 08/24/23 1803)  chlorhexidine (PERIDEX) 0.12 % solution 15 mL (15 mLs Mouth/Throat Given 08/24/23 0800)  amisulpride (BARHEMSYS) injection 10 mg (10 mg Intravenous Given 08/24/23 0943)   Review of Systems  Constitutional:  Positive for malaise/fatigue and weight loss.  Neurological:  Positive for weakness.   Past Medical History:  Diagnosis Date   Anemia    Cirrhosis (HCC)    Diabetes mellitus without complication (HCC)    Esophagitis    GERD (gastroesophageal reflux disease)    History of colon polyps    Hypertension    PONV (postoperative nausea and vomiting)    Skin cancer    Sleep apnea    Wilson disease    Past Surgical History:  Procedure Laterality Date   CATARACT EXTRACTION W/PHACO Right 02/27/2020   Procedure: CATARACT EXTRACTION PHACO AND INTRAOCULAR LENS PLACEMENT (IOC) RIGHT DIABETIC 3.78   00:55.4 6.8%;  Surgeon: Annell Kidney, MD;  Location: [facility];  Service: Ophthalmology;  Laterality: Right;  Diabetic - oral meds   CATARACT EXTRACTION W/PHACO Left 03/12/2020   Procedure: CATARACT EXTRACTION PHACO AND INTRAOCULAR LENS PLACEMENT (IOC) LEFT DIABETIC 1.54 00:29.3 5.2%;  Surgeon: Annell Kidney, MD;  Location: [facility];  Service: Ophthalmology;  Laterality: Left;  patient prefers arrival after 10am   COLONOSCOPY     COLONOSCOPY WITH PROPOFOL N/A 08/18/2023   Procedure: COLONOSCOPY WITH PROPOFOL;  Surgeon: Selena Daily, MD;  Location: [facility] ENDOSCOPY;  Service: Gastroenterology;  Laterality: N/A;   ESOPHAGEAL BANDING  01/13/2023   Procedure: ESOPHAGEAL BANDING;  Surgeon: Selena Daily, MD;   Location: [facility] ENDOSCOPY;  Service: Gastroenterology;;   ESOPHAGEAL BANDING  02/24/2023   Procedure: ESOPHAGEAL BANDING;  Surgeon: Selena Daily, MD;  Location: [facility] ENDOSCOPY;  Service: Gastroenterology;;   ESOPHAGEAL BRUSHING  04/21/2023   Procedure: ESOPHAGEAL BRUSHING;  Surgeon: Selena Daily, MD;  Location: [facility] ENDOSCOPY;  Service: Gastroenterology;;   ESOPHAGOGASTRODUODENOSCOPY N/A 01/13/2023   Procedure: ESOPHAGOGASTRODUODENOSCOPY (EGD);  Surgeon: Selena Daily, MD;  Location: [facility] ENDOSCOPY;  Service: Gastroenterology;  Laterality: N/A;   ESOPHAGOGASTRODUODENOSCOPY (EGD) WITH PROPOFOL N/A 05/05/2016   Procedure: ESOPHAGOGASTRODUODENOSCOPY (EGD) WITH PROPOFOL;  Surgeon: Cassie Click, MD;  Location: [facility] ENDOSCOPY;  Service: Endoscopy;  Laterality: N/A;   ESOPHAGOGASTRODUODENOSCOPY (EGD) WITH PROPOFOL N/A 02/24/2023   Procedure: ESOPHAGOGASTRODUODENOSCOPY (EGD) WITH PROPOFOL;  Surgeon: Selena Daily, MD;  Location: [facility] ENDOSCOPY;  Service: Gastroenterology;  Laterality: N/A;   ESOPHAGOGASTRODUODENOSCOPY (EGD) WITH PROPOFOL N/A 04/21/2023   Procedure: ESOPHAGOGASTRODUODENOSCOPY (EGD) WITH PROPOFOL;  Surgeon: Selena Daily, MD;  Location: [facility] ENDOSCOPY;  Service: Gastroenterology;  Laterality: N/A;   ESOPHAGOGASTRODUODENOSCOPY (EGD) WITH PROPOFOL N/A 08/18/2023   Procedure: ESOPHAGOGASTRODUODENOSCOPY (EGD) WITH PROPOFOL;  Surgeon: Selena Daily, MD;  Location: [facility]  ENDOSCOPY;  Service: Gastroenterology;  Laterality: N/A;   EYE SURGERY     HOT HEMOSTASIS  01/13/2023   Procedure: HOT HEMOSTASIS (ARGON PLASMA COAGULATION/BICAP);  Surgeon: Selena Daily, MD;  Location: [facility] ENDOSCOPY;  Service: Gastroenterology;;   HOT HEMOSTASIS  02/24/2023   Procedure: HOT HEMOSTASIS (ARGON PLASMA COAGULATION/BICAP);  Surgeon: Selena Daily, MD;  Location: [facility] ENDOSCOPY;  Service: Gastroenterology;;   IR BONE MARROW BIOPSY & ASPIRATION  06/10/2023   LIVER  BIOPSY     SKIN CANCER EXCISION     TONSILLECTOMY     UVULOPALATOPHARYNGOPLASTY  2005    reports that he has never smoked. He has never used smokeless tobacco. He reports that he does not drink alcohol and does not use drugs. Allergies  Allergen Reactions   Ace Inhibitors Cough   Serotonin Other (See Comments)    hallucinations   Shellfish Allergy Other (See Comments)    Nuts  Chocolate   Penicillins Rash   Family History  Problem Relation Age of Onset   Diabetes Mother    Heart disease Mother    Cancer Father        colon   Heart attack Father    Prior to Admission medications   Medication Sig Start Date End Date Taking? Authorizing Provider  carvedilol (COREG) 6.25 MG tablet Take 3.125 mg by mouth 2 (two) times daily with a meal.   Yes [provider]  cholecalciferol (VITAMIN D) 1000 units tablet Take 1,000 Units by mouth daily.   Yes [provider]  donepezil (ARICEPT) 10 MG tablet Take 5 mg by mouth at bedtime.   Yes [provider]  empagliflozin (JARDIANCE) 10 MG TABS tablet Take 5 mg by mouth 2 (two) times daily.   Yes [provider]  ferrous gluconate (FERGON) 324 MG tablet Take 324 mg by mouth 3 (three) times a week. 09/02/21 08/19/23 Yes [provider]  gabapentin (NEURONTIN) 100 MG capsule Take 100 mg by mouth 2 (two) times daily. 07/28/23  Yes [provider]  glipiZIDE (GLUCOTROL XL) 2.5 MG 24 hr tablet Take 2.5 mg by mouth daily with breakfast. 08/01/23 07/31/24 Yes [provider]  Glycerin-Hypromellose-PEG 400 (DRY EYE RELIEF DROPS) 0.2-0.2-1 % SOLN Place 1 drop into both eyes daily as needed (Dry eyes).   Yes [provider]  ketoconazole (NIZORAL) 2 % shampoo Apply 1 Application topically 2 (two) times a week. 06/28/22  Yes [provider]  lactulose, encephalopathy, (CHRONULAC) 10 GM/15ML SOLN Take 20 g by mouth daily as needed (Constipation).   Yes [provider]   lipase/protease/amylase (CREON) 36000 UNITS CPEP capsule Take 2 capsules with the first bite of each meal and 1 capsule with the first bite of each snack 08/23/23  Yes Vanga, Elson Halon, MD  Magnesium 250 MG TABS Take 250 mg by mouth daily.   Yes [provider]  MULTIPLE VITAMIN PO Take 1 tablet by mouth daily.    Yes [provider]  omeprazole (PRILOSEC) 40 MG capsule Take 1 capsule (40 mg total) by mouth 2 (two) times daily before a meal. 04/21/23 01/16/24 Yes Vanga, Elson Halon, MD  ondansetron (ZOFRAN-ODT) 4 MG disintegrating tablet Take 4 mg by mouth every 8 (eight) hours as needed for nausea. 01/21/23  Yes [provider]  oxyCODONE (OXY IR/ROXICODONE) 5 MG immediate release tablet Take 5 mg by mouth 2 (two) times daily. 08/10/23 08/30/23 Yes [provider]  pantoprazole (PROTONIX) 40 MG tablet Take 40 mg by mouth  daily. 01/16/21  Yes [provider]  potassium chloride (KLOR-CON) 8 MEQ tablet Take 8 mEq by mouth daily.   Yes [provider]  rifaximin (XIFAXAN) 550 MG TABS tablet Take 550 mg by mouth 2 (two) times daily.   Yes [provider]  tamsulosin (FLOMAX) 0.4 MG CAPS capsule Take 0.4 mg by mouth daily. 05/11/22  Yes [provider]  triamterene-hydrochlorothiazide (MAXZIDE-25) 37.5-25 MG tablet Take 1 tablet by mouth daily.   Yes [provider]  Trientine HCl (SYPRINE) 250 MG CAPS Take 500 capsules by mouth 2 (two) times daily.   Yes [provider]  PARoxetine (PAXIL) 20 MG tablet Take by mouth. Patient not taking: Reported on 04/27/2023 04/06/23 04/05/24  [provider]                                                                                 Vitals:   08/24/23 1107 08/24/23 1153 08/24/23 1421 08/24/23 1941  BP: (!) 141/67 136/78 128/68 (!) 127/57  Pulse: 82 86 85 85  Resp: 18 18 16 17   Temp: 98.7 F (37.1 C) 98.8 F (37.1 C) 98.2 F (36.8 C) 98.8 F (37.1 C)  TempSrc:  Oral Oral Oral   SpO2: 97% 97% 98% 94%  Weight:      Height:       Physical Exam Constitutional:      Appearance: He is ill-appearing.  Cardiovascular:     Rate and Rhythm: Normal rate and regular rhythm.     Pulses: Normal pulses.     Heart sounds: Normal heart sounds.  Pulmonary:     Effort: Pulmonary effort is normal.     Breath sounds: Normal breath sounds.  Abdominal:     General: Bowel sounds are normal. There is no distension.     Palpations: Abdomen is soft.     Tenderness: There is no abdominal tenderness. There is no guarding.  Musculoskeletal:     Right lower leg: No edema.     Left lower leg: No edema.  Neurological:     General: No focal deficit present.     Mental Status: He is alert and oriented to person, place, and time.     Labs on Admission: I have personally reviewed following labs and imaging studies CBC: Recent Labs  Lab 08/24/23 0810  WBC 6.0  HGB 10.3*  HCT 30.5*  MCV 104.8*  PLT 69*   Basic Metabolic Panel: Recent Labs  Lab 08/24/23 0810  NA 138  K 3.3*  CL 100  CO2 23  GLUCOSE 220*  BUN 28*  CREATININE 0.78  CALCIUM 9.0   GFR: Estimated Creatinine Clearance: 66.1 mL/min (by C-G formula based on SCr of 0.78 mg/dL). Liver Function Tests: Recent Labs  Lab 08/24/23 0810  AST 49*  ALT 50*  ALKPHOS 106  BILITOT 1.6*  PROT 5.0*  ALBUMIN 3.0*   No results for input(s): "LIPASE", "AMYLASE" in the last 168 hours. Recent Labs  Lab 08/24/23 1512  AMMONIA 89*   Coagulation Profile: Recent Labs  Lab 08/24/23 0810  INR 1.3*   Cardiac Enzymes: No results for input(s): "CKTOTAL", "CKMB", "CKMBINDEX", "TROPONINI" in the last 168 hours. BNP (  last 3 results) No results for input(s): "PROBNP" in the last 8760 hours. HbA1C: Recent Labs    08/24/23 1512  HGBA1C 6.8*   CBG: Recent Labs  Lab 08/18/23 0729 08/24/23 0757 08/24/23 0952 08/24/23 1156 08/24/23 1758  GLUCAP 120* 219* 212* 185* 134*   Lipid Profile: No results  for input(s): "CHOL", "HDL", "LDLCALC", "TRIG", "CHOLHDL", "LDLDIRECT" in the last 72 hours. Thyroid Function Tests: Recent Labs    08/24/23 1512  TSH 3.148  FREET4 0.88   Anemia Panel: Recent Labs    08/24/23 1512  VITAMINB12 1,310*   Urine analysis: No results found for: "COLORURINE", "APPEARANCEUR", "LABSPEC", "PHURINE", "GLUCOSEU", "HGBUR", "BILIRUBINUR", "KETONESUR", "PROTEINUR", "UROBILINOGEN", "NITRITE", "LEUKOCYTESUR" Radiological Exams on Admission: CT HEAD WO CONTRAST ( ) Result Date: 08/24/2023 CLINICAL DATA:  Altered mental status.  No history of trauma. EXAM: CT HEAD WITHOUT CONTRAST TECHNIQUE: Contiguous axial images were obtained from the base of the skull through the vertex without intravenous contrast. RADIATION DOSE REDUCTION: This exam was performed according to the departmental dose-optimization program which includes automated exposure control, adjustment of the mA and/or kV according to patient size and/or use of iterative reconstruction technique. COMPARISON:  10/24/2017 FINDINGS: Brain: Mild age related volume loss. No subjective lobar predominance. No sign of acute infarction, mass lesion, hemorrhage, hydrocephalus or extra-axial collection. Vascular: No abnormal vascular finding. Skull: Negative Sinuses/Orbits: Clear/normal Other: None IMPRESSION: No acute or reversible finding. Mild age related volume loss. Electronically Signed   By: Bettylou Brunner M.D.   On: 08/24/2023 16:21   Data Reviewed: Relevant notes from primary care and specialist visits, past discharge summaries as available in EHR, including Care Everywhere. Prior diagnostic testing as pertinent to current admission diagnoses, Updated medications and problem lists for reconciliation ED course, including vitals, labs, imaging, treatment and response to treatment,Triage notes, nursing and pharmacy notes and ED provider's notes Notable results as noted in HPI.Discussed case with EDMD/ ED APP/ or Specialty  MD on call and as needed.  Assessment & Plan  >>AMS: Suspect from hepatic encephalopathy. Ammonia level p[ending. Lactulose continued. Will consider rocephin.  Neuro check, aspirin and fall precaution.  EEG for > seizures.  Will continue patient on lactulose patient is having ammonia of 89. Head CT done today shows no acute findings and age-related volume loss.  >> Hepatic encephalopathy: From decompensated cirrhosis from Wilson's disease. Treatment team continued mild transaminitis noted. IR consult for paracentesis in the a.m.   >> Macrocytosis with anemia: Type and screen, B12 folate level, free T4 TSH.  >> Cachexia/ DM II: Suspect from pancreatic insufficiency. No clear history of any malignancy.  Pt would benefit from same.  Will avoid medications like metformin and Jardiance which will further enhance wound loss.  Nutritionist consulted.   >> Anemia: Hemoglobin of 10.3 MCV of 104.8 ,,69 thousand platelets.  Normal white count. Thrombocytopenia due to cirrhosis.  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  IR consult for paracentesis.  Not sure if there is an underlying malignancy will add AFP.   DVT prophylaxis:  SCDs Consults:  None Advance Care Planning:    Code Status: Full Code   Family Communication:  Wife Disposition Plan:  Home Severity of Illness: The appropriate patient status for this patient is OBSERVATION. Observation status is judged to be reasonable and necessary in order to provide the required intensity of service to ensure the patient's safety. The patient's presenting symptoms, physical exam findings, and initial radiographic and laboratory data in the context of their medical condition is felt  to place them at decreased risk for further clinical deterioration. Furthermore, it is anticipated that the patient will be medically stable for discharge from the hospital within 2 midnights of admission.   Unresulted Labs (From admission,  onward)     Start     Ordered   08/24/23 1432  AFP tumor marker  Add-on,   AD        08/24/23 1431            Orders Placed This Encounter  Procedures   Surgical pcr screen   CT HEAD WO CONTRAST ( )   Protime-INR   CBC   Comprehensive metabolic panel   Glucose, capillary   Glucose, capillary   Glucose, capillary   Ammonia   Hemoglobin A1c   Vitamin B12   T4, free   TSH   AFP tumor marker   Glucose, capillary   Diet regular Room service appropriate? Yes; Fluid consistency: Thin   Pre-admission testing diagnosis   Pre-admission testing diagnosis   Informed Consent Details: Physician/Practitioner Attestation; Transcribe to consent form and obtain patient signature   Pre-admission testing diagnosis   SCD's   Vital signs with neuro checks   Mobilize POD 0 at least 1x per shift, POD 1 at least 2x per shift.  First ambulation no later than 8am After 48 hours up ad lib   No brace needed   Intake and output   If unable to void, assess perineal sensation. If sensation is decreased, notify provider; if sensation is intact, may catheterize every 6 hours PRN.   Initiate Oral Care Protocol   Initiate Carrier Fluid Protocol   Apply Spinal Surgery Care Plan   Patient has an active order for admit to inpatient/place in observation   Apply Diabetes Mellitus Care Plan   STAT CBG when hypoglycemia is suspected. If treated, recheck every 15 minutes after each treatment until CBG >/= 70 mg/dl   Refer to Hypoglycemia Protocol Sidebar Report for treatment of CBG < 70 mg/dl   No HS correction Insulin   Swallow screen   Full code   Consult to Transition of Care Team   Consult to Registered Dietitian   OT eval and treat   PT eval and treat   Incentive spirometry  every 2 hours while awake for 48 hours then QID and prn.   EKG 12-Lead   EKG 12-Lead   Type and screen [facility]   Prepare platelet pheresis   EEG adult   Place in observation (patient's expected length  of stay will be less than 2 midnights)   Precautions:  No bending, arching, or twisting    Author: Lavanda Porter, MD 12 pm -8 pm. 08/24/2023 8:18 PM >>Please note for any concern,or critical results after hours past 8pm please contact the [facility] hospitalist [facility] floor coverage provider from 7 PM- 7 AM. For on call review [email or web address], username [email or web address] and PW: your phone number<<

## 2023-08-24 NOTE — Progress Notes (Signed)
EEG complete. Results pending.  ?

## 2023-08-24 NOTE — Progress Notes (Signed)
 Pt is going to CT, per [employer].  Check back as schedule permits.

## 2023-08-24 NOTE — Progress Notes (Signed)
 Dr. Lamon Pillow here and cancelling surgery at this time. Patient to be admitted by hospitalist. Dr. Lamon Pillow contacting admissions at this time.

## 2023-08-24 NOTE — H&P (Signed)
 Jack Powell is an 73 y.o. male.   Chief Complaint: Nausea vomiting back and left leg pain HPI: 73 year old gentleman who should have a short stay for lumbar microdiscectomy however had episode of vomiting this morning and has been progressively confused lethargic couple episodes of vomiting upon arrival to short stay anesthesia was called and certainly patient looks very sickly and unable to proceed with the scheduled operation.  So we will admit the patient to be seen by medicine worked up stabilize and will defer surgery to when patient's more medically ready to get transfused a 5 pack of platelets.  Preoperative platelet count of 67  Past Medical History:  Diagnosis Date   Anemia    Cirrhosis (HCC)    Diabetes mellitus without complication (HCC)    Esophagitis    GERD (gastroesophageal reflux disease)    History of colon polyps    Hypertension    PONV (postoperative nausea and vomiting)    Skin cancer    Sleep apnea    Wilson disease     Past Surgical History:  Procedure Laterality Date   CATARACT EXTRACTION W/PHACO Right 02/27/2020   Procedure: CATARACT EXTRACTION PHACO AND INTRAOCULAR LENS PLACEMENT (IOC) RIGHT DIABETIC 3.78   00:55.4 6.8%;  Surgeon: Lockie Mola, MD;  Location: [facility];  Service: Ophthalmology;  Laterality: Right;  Diabetic - oral meds   CATARACT EXTRACTION W/PHACO Left 03/12/2020   Procedure: CATARACT EXTRACTION PHACO AND INTRAOCULAR LENS PLACEMENT (IOC) LEFT DIABETIC 1.54 00:29.3 5.2%;  Surgeon: Lockie Mola, MD;  Location: [facility];  Service: Ophthalmology;  Laterality: Left;  patient prefers arrival after 10am   COLONOSCOPY     COLONOSCOPY WITH PROPOFOL N/A 08/18/2023   Procedure: COLONOSCOPY WITH PROPOFOL;  Surgeon: Toney Reil, MD;  Location: [facility] ENDOSCOPY;  Service: Gastroenterology;  Laterality: N/A;   ESOPHAGEAL BANDING  01/13/2023   Procedure: ESOPHAGEAL BANDING;  Surgeon: Toney Reil, MD;   Location: [facility] ENDOSCOPY;  Service: Gastroenterology;;   ESOPHAGEAL BANDING  02/24/2023   Procedure: ESOPHAGEAL BANDING;  Surgeon: Toney Reil, MD;  Location: [facility] ENDOSCOPY;  Service: Gastroenterology;;   ESOPHAGEAL BRUSHING  04/21/2023   Procedure: ESOPHAGEAL BRUSHING;  Surgeon: Toney Reil, MD;  Location: [facility] ENDOSCOPY;  Service: Gastroenterology;;   ESOPHAGOGASTRODUODENOSCOPY N/A 01/13/2023   Procedure: ESOPHAGOGASTRODUODENOSCOPY (EGD);  Surgeon: Toney Reil, MD;  Location: [facility] ENDOSCOPY;  Service: Gastroenterology;  Laterality: N/A;   ESOPHAGOGASTRODUODENOSCOPY (EGD) WITH PROPOFOL N/A 05/05/2016   Procedure: ESOPHAGOGASTRODUODENOSCOPY (EGD) WITH PROPOFOL;  Surgeon: Scot Jun, MD;  Location: [facility] ENDOSCOPY;  Service: Endoscopy;  Laterality: N/A;   ESOPHAGOGASTRODUODENOSCOPY (EGD) WITH PROPOFOL N/A 02/24/2023   Procedure: ESOPHAGOGASTRODUODENOSCOPY (EGD) WITH PROPOFOL;  Surgeon: Toney Reil, MD;  Location: [facility] ENDOSCOPY;  Service: Gastroenterology;  Laterality: N/A;   ESOPHAGOGASTRODUODENOSCOPY (EGD) WITH PROPOFOL N/A 04/21/2023   Procedure: ESOPHAGOGASTRODUODENOSCOPY (EGD) WITH PROPOFOL;  Surgeon: Toney Reil, MD;  Location: [facility] ENDOSCOPY;  Service: Gastroenterology;  Laterality: N/A;   ESOPHAGOGASTRODUODENOSCOPY (EGD) WITH PROPOFOL N/A 08/18/2023   Procedure: ESOPHAGOGASTRODUODENOSCOPY (EGD) WITH PROPOFOL;  Surgeon: Toney Reil, MD;  Location: [facility] ENDOSCOPY;  Service: Gastroenterology;  Laterality: N/A;   EYE SURGERY     HOT HEMOSTASIS  01/13/2023   Procedure: HOT HEMOSTASIS (ARGON PLASMA COAGULATION/BICAP);  Surgeon: Toney Reil, MD;  Location: [facility] ENDOSCOPY;  Service: Gastroenterology;;   HOT HEMOSTASIS  02/24/2023   Procedure: HOT HEMOSTASIS (ARGON PLASMA COAGULATION/BICAP);  Surgeon: Toney Reil, MD;  Location: [facility] ENDOSCOPY;  Service: Gastroenterology;;   IR BONE  MARROW BIOPSY & ASPIRATION  06/10/2023   LIVER  BIOPSY     SKIN CANCER EXCISION     TONSILLECTOMY     UVULOPALATOPHARYNGOPLASTY  2005    Family History  Problem Relation Age of Onset   Diabetes Mother    Heart disease Mother    Cancer Father        colon   Heart attack Father    Social History:  reports that he has never smoked. He has never used smokeless tobacco. He reports that he does not drink alcohol and does not use drugs.  Allergies:  Allergies  Allergen Reactions   Ace Inhibitors Cough   Serotonin Other (See Comments)    hallucinations   Shellfish Allergy Other (See Comments)    Nuts  Chocolate   Penicillins Rash    Medications Prior to Admission  Medication Sig Dispense Refill   carvedilol (COREG) 6.25 MG tablet Take 3.125 mg by mouth 2 (two) times daily with a meal.     cholecalciferol (VITAMIN D) 1000 units tablet Take 1,000 Units by mouth daily.     donepezil (ARICEPT) 10 MG tablet Take 5 mg by mouth at bedtime.     empagliflozin (JARDIANCE) 10 MG TABS tablet Take 5 mg by mouth 2 (two) times daily.     ferrous gluconate (FERGON) 324 MG tablet Take 324 mg by mouth 3 (three) times a week.     gabapentin (NEURONTIN) 100 MG capsule Take 100 mg by mouth 2 (two) times daily.     glipiZIDE (GLUCOTROL XL) 2.5 MG 24 hr tablet Take 2.5 mg by mouth daily with breakfast.     Glycerin-Hypromellose-PEG 400 (DRY EYE RELIEF DROPS) 0.2-0.2-1 % SOLN Place 1 drop into both eyes daily as needed (Dry eyes).     ketoconazole (NIZORAL) 2 % shampoo Apply 1 Application topically 2 (two) times a week.     lactulose, encephalopathy, (CHRONULAC) 10 GM/15ML SOLN Take 20 g by mouth daily as needed (Constipation).     lipase/protease/amylase (CREON) 36000 UNITS CPEP capsule Take 2 capsules with the first bite of each meal and 1 capsule with the first bite of each snack 240 capsule 3   Magnesium 250 MG TABS Take 250 mg by mouth daily.     MULTIPLE VITAMIN PO Take 1 tablet by mouth daily.      omeprazole (PRILOSEC) 40 MG capsule Take 1  capsule (40 mg total) by mouth 2 (two) times daily before a meal. 180 capsule 2   ondansetron (ZOFRAN-ODT) 4 MG disintegrating tablet Take 4 mg by mouth every 8 (eight) hours as needed for nausea.     oxyCODONE (OXY IR/ROXICODONE) 5 MG immediate release tablet Take 5 mg by mouth 2 (two) times daily.     pantoprazole (PROTONIX) 40 MG tablet Take 40 mg by mouth daily.     potassium chloride (KLOR-CON) 8 MEQ tablet Take 8 mEq by mouth daily.     rifaximin (XIFAXAN) 550 MG TABS tablet Take 550 mg by mouth 2 (two) times daily.     tamsulosin (FLOMAX) 0.4 MG CAPS capsule Take 0.4 mg by mouth daily.     triamterene-hydrochlorothiazide (MAXZIDE-25) 37.5-25 MG tablet Take 1 tablet by mouth daily.     Trientine HCl (SYPRINE) 250 MG CAPS Take 500 capsules by mouth 2 (two) times daily.     PARoxetine (PAXIL) 20 MG tablet Take by mouth. (Patient not taking: Reported on 04/27/2023)      Results for orders placed or performed during the  hospital encounter of 08/24/23 (from the past 48 hours)  Prepare platelet pheresis     Status: None (Preliminary result)   Collection Time: 08/24/23  7:40 AM  Result Value Ref Range   Unit Number [id number]    Blood Component Type PLTP1 PSORALEN TREATED    Unit division 00    Status of Unit ISSUED    Transfusion Status      OK TO TRANSFUSE Performed at [facility], [address] [place], [place] [address]    Unit Number [id number]    Blood Component Type PLTP2 PSORALEN TREATED    Unit division 00    Status of Unit ISSUED    Transfusion Status OK TO TRANSFUSE   Type and screen  [facility]     Status: None   Collection Time: 08/24/23  7:50 AM  Result Value Ref Range   ABO/RH(D) B POS    Antibody Screen NEG    Sample Expiration      08/27/2023,2359 Performed at [facility], [address], [place], [place] [address]   Glucose, capillary     Status: Abnormal   Collection Time: 08/24/23  7:57 AM  Result Value Ref Range    Glucose-Capillary 219 (H) 70 - 99 mg/dL    Comment: Glucose reference range applies only to samples taken after fasting for at least 8 hours.  Protime-INR     Status: Abnormal   Collection Time: 08/24/23  8:10 AM  Result Value Ref Range   Prothrombin Time 16.3 (H) 11.4 - 15.2 seconds   INR 1.3 (H) 0.8 - 1.2    Comment: (NOTE) INR goal varies based on device and disease states. Performed at [facility], [address] [place], [place] [address]   CBC     Status: Abnormal   Collection Time: 08/24/23  8:10 AM  Result Value Ref Range   WBC 6.0 4.0 - 10.5 K/uL   RBC 2.91 (L) 4.22 - 5.81 MIL/uL   Hemoglobin 10.3 (L) 13.0 - 17.0 g/dL   HCT 84.1 (L) 32.4 - 40.1 %   MCV 104.8 (H) 80.0 - 100.0 fL   MCH 35.4 (H) 26.0 - 34.0 pg   MCHC 33.8 30.0 - 36.0 g/dL   RDW 02.7 (H) 25.3 - 66.4 %   Platelets 69 (L) 150 - 400 K/uL    Comment: Immature Platelet Fraction may be clinically indicated, consider ordering this additional test [id number] REPEATED TO VERIFY    nRBC 0.0 0.0 - 0.2 %    Comment: Performed at [facility], [address], [place], [place] [address]  Comprehensive metabolic panel     Status: Abnormal   Collection Time: 08/24/23  8:10 AM  Result Value Ref Range   Sodium 138 135 - 145 mmol/L   Potassium 3.3 (L) 3.5 - 5.1 mmol/L   Chloride 100 98 - 111 mmol/L   CO2 23 22 - 32 mmol/L   Glucose, Bld 220 (H) 70 - 99 mg/dL    Comment: Glucose reference range applies only to samples taken after fasting for at least 8 hours.   BUN 28 (H) 8 - 23 mg/dL   Creatinine, Ser 7.56 0.61 - 1.24 mg/dL   Calcium 9.0 8.9 - 43.3 mg/dL   Total Protein 5.0 (L) 6.5 - 8.1 g/dL   Albumin 3.0 (L) 3.5 - 5.0 g/dL   AST 49 (H) 15 - 41 U/L   ALT 50 (H) 0 - 44 U/L  Alkaline Phosphatase 106 38 - 126 U/L   Total Bilirubin 1.6 (H) 0.0 - 1.2 mg/dL   GFR, Estimated >16 >10 mL/min    Comment: (NOTE) Calculated using the CKD-EPI Creatinine Equation (2021)    Anion gap 15 5 - 15    Comment:  Performed at [facility], [address], [place], [place] [address]  Glucose, capillary     Status: Abnormal   Collection Time: 08/24/23  9:52 AM  Result Value Ref Range   Glucose-Capillary 212 (H) 70 - 99 mg/dL    Comment: Glucose reference range applies only to samples taken after fasting for at least 8 hours.   Comment 1 Notify RN    Comment 2 Document in Chart    No results found.  Review of Systems  Gastrointestinal:  Positive for nausea and vomiting.  Musculoskeletal:  Positive for back pain.  Neurological:  Positive for dizziness, weakness and numbness.    Blood pressure (!) 141/67, pulse 82, temperature 98.7 F (37.1 C), temperature source Oral, resp. rate 18, height 6' (1.829 m), weight 54.4 kg, SpO2 97%. Physical Exam HENT:     Right Ear: Tympanic membrane normal.     Nose: Nose normal.     Mouth/Throat:     Mouth: Mucous membranes are moist.  Cardiovascular:     Rate and Rhythm: Normal rate.  Abdominal:     Palpations: Abdomen is soft.  Neurological:     Mental Status: He is alert.     Comments: Patient is awake and alert slightly confused mildly moves all extremities well diffusely weak 4+ out of 5 with a slight left foot drop at baseline.  Patient is cachectic lost 25 pounds over the last month or 2      Assessment/Plan 73 year old gentleman with a history of Wilson's disease thrombocytopenic and very cachectic nauseous vomiting we will cancel surgery admitting for workup.  Ferris Hua, MD 08/24/2023, 11:13 AM

## 2023-08-25 ENCOUNTER — Observation Stay (HOSPITAL_COMMUNITY)

## 2023-08-25 DIAGNOSIS — R64 Cachexia: Secondary | ICD-10-CM | POA: Diagnosis not present

## 2023-08-25 HISTORY — PX: IR PARACENTESIS: IMG2679

## 2023-08-25 LAB — PREPARE PLATELET PHERESIS
Unit division: 0
Unit division: 0

## 2023-08-25 LAB — BPAM PLATELET PHERESIS
Blood Product Expiration Date: 202504182359
Blood Product Expiration Date: 202504202359
ISSUE DATE / TIME: 202504160938
ISSUE DATE / TIME: 202504160938
Unit Type and Rh: 6200
Unit Type and Rh: 7300

## 2023-08-25 LAB — CBC
HCT: 30.1 % — ABNORMAL LOW (ref 39.0–52.0)
Hemoglobin: 9.7 g/dL — ABNORMAL LOW (ref 13.0–17.0)
MCH: 34.5 pg — ABNORMAL HIGH (ref 26.0–34.0)
MCHC: 32.2 g/dL (ref 30.0–36.0)
MCV: 107.1 fL — ABNORMAL HIGH (ref 80.0–100.0)
Platelets: 94 10*3/uL — ABNORMAL LOW (ref 150–400)
RBC: 2.81 MIL/uL — ABNORMAL LOW (ref 4.22–5.81)
RDW: 18.1 % — ABNORMAL HIGH (ref 11.5–15.5)
WBC: 7 10*3/uL (ref 4.0–10.5)
nRBC: 0 % (ref 0.0–0.2)

## 2023-08-25 LAB — COMPREHENSIVE METABOLIC PANEL WITH GFR
ALT: 44 U/L (ref 0–44)
AST: 38 U/L (ref 15–41)
Albumin: 2.6 g/dL — ABNORMAL LOW (ref 3.5–5.0)
Alkaline Phosphatase: 70 U/L (ref 38–126)
Anion gap: 9 (ref 5–15)
BUN: 21 mg/dL (ref 8–23)
CO2: 24 mmol/L (ref 22–32)
Calcium: 8 mg/dL — ABNORMAL LOW (ref 8.9–10.3)
Chloride: 106 mmol/L (ref 98–111)
Creatinine, Ser: 0.8 mg/dL (ref 0.61–1.24)
GFR, Estimated: >60 mL/min (ref >60)
Glucose, Bld: 256 mg/dL — ABNORMAL HIGH (ref 70–99)
Potassium: 3.5 mmol/L (ref 3.5–5.1)
Sodium: 139 mmol/L (ref 135–145)
Total Bilirubin: 1.6 mg/dL — ABNORMAL HIGH (ref 0.0–1.2)
Total Protein: 4.7 g/dL — ABNORMAL LOW (ref 6.5–8.1)

## 2023-08-25 LAB — BODY FLUID CELL COUNT WITH DIFFERENTIAL
Eos, Fluid: 0 %
Lymphs, Fluid: 33 %
Monocyte-Macrophage-Serous Fluid: 66 % (ref 50–90)
Neutrophil Count, Fluid: 1 % (ref 0–25)
Total Nucleated Cell Count, Fluid: 71 uL (ref 0–1000)

## 2023-08-25 LAB — GLUCOSE, CAPILLARY
Glucose-Capillary: 188 mg/dL — ABNORMAL HIGH (ref 70–99)
Glucose-Capillary: 278 mg/dL — ABNORMAL HIGH (ref 70–99)
Glucose-Capillary: 235 mg/dL — ABNORMAL HIGH (ref 70–99)
Glucose-Capillary: 213 mg/dL — ABNORMAL HIGH (ref 70–99)

## 2023-08-25 LAB — AMMONIA: Ammonia: 78 umol/L — ABNORMAL HIGH (ref 9–35)

## 2023-08-25 LAB — AFP TUMOR MARKER: AFP, Serum, Tumor Marker: 2 ng/mL (ref 0.0–8.4)

## 2023-08-25 MED ORDER — PANTOPRAZOLE SODIUM 40 MG PO TBEC
40.0000 mg | DELAYED_RELEASE_TABLET | Freq: Every day | ORAL | Status: DC
  Administered 2023-08-25: 40 mg via ORAL
  Filled 2023-08-25: qty 1

## 2023-08-25 MED ORDER — LIDOCAINE HCL 1 % IJ SOLN
INTRAMUSCULAR | Status: AC
  Filled 2023-08-25: qty 20

## 2023-08-25 MED ORDER — ALBUMIN HUMAN 25 % IV SOLN
25.0000 g | Freq: Once | INTRAVENOUS | Status: AC
  Administered 2023-08-25: 25 g via INTRAVENOUS
  Filled 2023-08-25: qty 100

## 2023-08-25 MED ORDER — OXYCODONE HCL 5 MG PO TABS
5.0000 mg | ORAL_TABLET | ORAL | Status: DC | PRN
  Administered 2023-08-26: 5 mg via ORAL
  Filled 2023-08-25 (×2): qty 1

## 2023-08-25 MED ORDER — LACTULOSE 10 GM/15ML PO SOLN
20.0000 g | ORAL | Status: AC
  Administered 2023-08-25: 20 g via ORAL
  Filled 2023-08-25: qty 30

## 2023-08-25 MED ORDER — LIDOCAINE HCL 1 % IJ SOLN
20.0000 mL | Freq: Once | INTRAMUSCULAR | Status: DC
  Filled 2023-08-25: qty 20

## 2023-08-25 MED ORDER — BOOST / RESOURCE BREEZE PO LIQD CUSTOM: 1.0000 | Freq: Two times a day (BID) | ORAL | Status: DC

## 2023-08-25 NOTE — Evaluation (Signed)
 Occupational Therapy Evaluation Patient Details Name: Jack Powell MRN: [id number] DOB: 03-Sep-1950 Today's Date: 08/25/2023   History of Present Illness   Pt is a 73 y.o. male admitted 4/16 for scheduled lumbar microdisectomy, However pt c/o of n/v, and lethargic, surgery postponed. CT showed no acute findings. PMH: anemia, cirrhosis, DM, HTN, skin cancer, wilson disease     Clinical Impressions Pt admitted based on above, and was seen based on problem list below. PTA pt was living with his wife and was  independent with ADLs and IADLs. Today pt is requiring set up  to CGA for ADLs. Functional transfers are  CGA. Pt presenting with improved mentation per wife, but still exhibiting decreased safety awareness and difficulty with sequencing and problem solving. Anticipate no follow up OT needs. OT will continue to follow acutely to maximize functional independence.        If plan is discharge home, recommend the following:   A little help with walking and/or transfers;A little help with bathing/dressing/bathroom     Functional Status Assessment   Patient has had a recent decline in their functional status and demonstrates the ability to make significant improvements in function in a reasonable and predictable amount of time.     Equipment Recommendations   BSC/3in1     Recommendations for Other Services         Precautions/Restrictions   Precautions Precautions: Fall Recall of Precautions/Restrictions: Intact Precaution/Restrictions Comments: Pt has AFO for L foot drop Restrictions Weight Bearing Restrictions Per Provider Order: No     Mobility Bed Mobility Overal bed mobility: Needs Assistance             General bed mobility comments: pt received in recliner    Transfers Overall transfer level: Needs assistance Equipment used: Rolling walker (2 wheels) Transfers: Sit to/from Stand, Bed to chair/wheelchair/BSC Sit to Stand: Contact guard  assist     Step pivot transfers: Contact guard assist     General transfer comment: increased time with mobility      Balance Overall balance assessment: Needs assistance Sitting-balance support: No upper extremity supported, Feet supported Sitting balance-Leahy Scale: Fair     Standing balance support: Bilateral upper extremity supported, During functional activity, Reliant on assistive device for balance Standing balance-Leahy Scale: Poor Standing balance comment: Reliant on RW       ADL either performed or assessed with clinical judgement   ADL Overall ADL's : Needs assistance/impaired Eating/Feeding: Set up;Sitting   Grooming: Wash/dry hands;Wash/dry face;Oral care;Applying deodorant;Contact guard assist;Standing Grooming Details (indicate cue type and reason): CGA for balance while standing         Upper Body Dressing : Set up;Sitting   Lower Body Dressing: Contact guard assist;Sit to/from stand Lower Body Dressing Details (indicate cue type and reason): CGA for balance Toilet Transfer: Contact guard assist;Ambulation;Regular Toilet;Rolling walker (2 wheels)   Toileting- Clothing Manipulation and Hygiene: Contact guard assist;Sit to/from stand Toileting - Clothing Manipulation Details (indicate cue type and reason): Pt able to wipe while seated no LOB during clothing manipulation while standing     Functional mobility during ADLs: Contact guard assist;Rolling walker (2 wheels)       Vision Baseline Vision/History: 0 No visual deficits Vision Assessment?: No apparent visual deficits            Pertinent Vitals/Pain Pain Assessment Pain Assessment: Faces Pain Score: 8  Pain Location: Back Pain Descriptors / Indicators: Aching, Constant, Discomfort Pain Intervention(s): Monitored during session, Premedicated before session, Repositioned  Extremity/Trunk Assessment Upper Extremity Assessment Upper Extremity Assessment: Overall WFL for tasks  assessed   Lower Extremity Assessment Lower Extremity Assessment: Defer to PT evaluation   Cervical / Trunk Assessment Cervical / Trunk Assessment: Normal   Communication Communication Communication: No apparent difficulties   Cognition Arousal: Alert Behavior During Therapy: WFL for tasks assessed/performed Cognition: Cognition impaired     Awareness: Intellectual awareness intact, Online awareness impaired     Executive functioning impairment (select all impairments): Sequencing, Problem solving OT - Cognition Comments: Pt having difficulty sequencing and problem solving during functional mobility                 Following commands: Impaired Following commands impaired: Follows one step commands with increased time     Cueing  General Comments   Cueing Techniques: Verbal cues  VSS on RA           Home Living Family/patient expects to be discharged to:: Private residence Living Arrangements: Spouse/significant other Available Help at Discharge: Family;Available 24 hours/day Type of Home: House Home Access: Level entry     Home Layout: One level     Bathroom Shower/Tub: Producer, television/film/video: Handicapped height Bathroom Accessibility: Yes How Accessible: Accessible via walker Home Equipment: Grab bars - toilet;Grab bars - tub/shower;Rolling Walker (2 wheels);Cane - single point;Shower seat - built in;Hand held shower head          Prior Functioning/Environment Prior Level of Function : Independent/Modified Independent             Mobility Comments: RW  some depending on pain      OT Problem List: Decreased strength;Decreased range of motion;Decreased activity tolerance;Impaired balance (sitting and/or standing);Decreased safety awareness;Decreased knowledge of use of DME or AE   OT Treatment/Interventions: Self-care/ADL training;Therapeutic exercise;Energy conservation;DME and/or AE instruction;Therapeutic  activities;Patient/family education;Balance training      OT Goals(Current goals can be found in the care plan section)   Acute Rehab OT Goals Patient Stated Goal: To go home OT Goal Formulation: With patient Time For Goal Achievement: 09/08/23 Potential to Achieve Goals: Good   OT Frequency:  Min 2X/week       AM-PAC OT "6 Clicks" Daily Activity     Outcome Measure Help from another person eating meals?: None Help from another person taking care of personal grooming?: A Little Help from another person toileting, which includes using toliet, bedpan, or urinal?: A Little Help from another person bathing (including washing, rinsing, drying)?: A Little Help from another person to put on and taking off regular upper body clothing?: A Little Help from another person to put on and taking off regular lower body clothing?: A Little 6 Click Score: 19   End of Session Equipment Utilized During Treatment: Gait belt;Rolling walker (2 wheels) Nurse Communication: Mobility status  Activity Tolerance: Patient tolerated treatment well Patient left: in chair;with call bell/phone within reach;with family/visitor present  OT Visit Diagnosis: Unsteadiness on feet (R26.81);Other abnormalities of gait and mobility (R26.89);Repeated falls (R29.6);History of falling (Z91.81);Muscle weakness (generalized) (M62.81)                Time: 6213-0865 OT Time Calculation (min): 26 min Charges:  OT General Charges $OT Visit: 1 Visit OT Evaluation $OT Eval Moderate Complexity: 1 Mod  Ivor Messier, OT  Acute Rehabilitation Services Office [phone] Secure chat preferred   Marilynne Drivers 08/25/2023, 10:37 AM

## 2023-08-25 NOTE — Progress Notes (Signed)
 Initial Nutrition Assessment  DOCUMENTATION CODES:   Severe malnutrition in context of acute illness/injury  INTERVENTION:  Magic cup TID with meals, each supplement provides 290 kcal and 9 grams of protein  Boost Breeze po BID, each supplement provides 250 kcal and 9 grams of protein  Double protein with meals.  Snacks three times daily.  Encouraged PO intake.   NUTRITION DIAGNOSIS:   Severe Malnutrition related to acute illness as evidenced by severe muscle depletion, severe fat depletion.  GOAL:   Patient will meet greater than or equal to 90% of their needs  MONITOR:   PO intake, Supplement acceptance, Weight trends  REASON FOR ASSESSMENT:   Consult Assessment of nutrition requirement/status  ASSESSMENT:   PMH diabetes mellitus type 2, esophagitis and GI bleed GERD iron deficiency anemia, essential hypertension, history of cirrhosis, from Wilson's disease on trientine, complicated by variceal hemorrhage and hepatic encephalopathy. Scheduled lumbar microdiscectomy on 4/16, however had episode of N/V and progressively confused lethargic. Admitted for cachexia and medical stabilization, surgery deferred.  RD met with pt and pt's wife at bedside. Pt reports not changing anything diet related in recent months but has been losing weight rapidly. Pt reports not liking Ensure so drinks Core Power protein shakes daily. Pt has Wilson's disease so eats a low-copper diet. Pt restricts shellfish, chocolate, sweet potato, organ meats and nuts. Pt willing to try Magic Cup, Boost breeze and have snacks.   Intake: Nursing flowsheets document meal completions between 50-75%.  Medications reviewed and include: Novolog, creon with meals, MVI with minerals, protonix, flomax, IVF 75 mL/hr  Labs reviewed: Potassium 3.3 L, BUN 28 H, Albumin 3.0 L, AST 49 H, ALT 50 H   Intake/Output Summary (Last 24 hours) at 08/25/2023 1422 Last data filed at 08/25/2023 1404 Gross per 24 hour  Intake  1880.64 ml  Output 600 ml  Net 1280.64 ml    Weights reviewed. Admit weight 54.4 kg. -11kg (17%) in 6 months. Significant per ASPEN. Chart notes reveal pt reports of 20# wt loss in 3 weeks.    NUTRITION - FOCUSED PHYSICAL EXAM:  Flowsheet Row Most Recent Value  Orbital Region Severe depletion  Upper Arm Region Severe depletion  Thoracic and Lumbar Region Severe depletion  Buccal Region Severe depletion  Temple Region Severe depletion  Clavicle Bone Region Severe depletion  Clavicle and Acromion Bone Region Severe depletion  Scapular Bone Region Severe depletion  Dorsal Hand Severe depletion  Patellar Region Severe depletion  Anterior Thigh Region Severe depletion  Posterior Calf Region Severe depletion  Edema (RD Assessment) None  Hair Reviewed  Eyes Reviewed  Mouth Reviewed  Skin Reviewed  Nails Reviewed       Diet Order:   Diet Order             Diet regular Room service appropriate? Yes; Fluid consistency: Thin  Diet effective now                   EDUCATION NEEDS:   Education needs have been addressed  Skin:  Skin Assessment: Reviewed RN Assessment  Last BM:  PTA  Height:   Ht Readings from Last 1 Encounters:  08/24/23 6' (1.829 m)    Weight:   Wt Readings from Last 1 Encounters:  08/24/23 54.4 kg    Ideal Body Weight:  80.4 kg  BMI:  Body mass index is 16.27 kg/m.  Estimated Nutritional Needs:   Kcal:  1650-1900  Protein:  80-110  Fluid:  >1.65 L/day  Laren Player, MPH, RD, LDN Clinical Dietitian Contact information can be found at [facility].

## 2023-08-25 NOTE — Progress Notes (Signed)
 Progress Note   Patient: Jack Powell WUJ:[id number] DOB: Sep 28, 1950 DOA: 08/24/2023     0 DOS: the patient was seen and examined on 08/25/2023   Brief hospital course: 73 y.o. 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 Per wife pt woke up confused.got him bed and he took PJ off and started walking saying getting socks.  Wife got him in the bathroom and asked him orientation question and didn't. know answers and got frustrated. Pt vomited in the car, and food debris and color unknown. Pt hadn't  eaten this am.  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   Patient was seen last week in neurosurgery clinic for left leg pain and foot.  Patient had been progressively looking thinner and had weight loss of 20 pounds in the past 3 weeks.  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   Patient was seen by hematology for preop clearance and was advised to get platelet transfusion before the surgery.Patient had colonoscopy on 10 April, path found to be neg  Assessment and Plan: >>Toxic metabolic encephalopathy likely secondary to hepatic encephalopathy -Ammonia level elevated at presentation to just under 90, was in the mid-20's before -Lactulose was resumed with marked improvement by the following day -Head CT done today shows no acute findings and age-related volume loss. -No bm today with ammonia still elevated in the 70's. Will give 20g lactulse. Goal is 2-3 BM daily -Pt is s/p paracentesis yielding 3.7L. FLuid analysis reviewed - rules out SBP. Cx neg  thus far. Fluid sent for cytology which can be f/u as outpatient -No florid evidence of acute infection identified. Possible that recent sedation may have precipitated hepatic encephalopathy, as benzos have been associated with triggering encephalopathy    >> Macrocytosis with anemia: -cont to follow -Recheck cbc in AM   >> Cachexia/ DM II: Suspect from pancreatic insufficiency. No clear history of any malignancy.  Pt would benefit from same.  Will avoid medications like metformin and Jardiance which will further enhance wound loss.  Nutritionist consulted.   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  -f/u on cytology, can be done as outpatient -fluid is neg for SBP      Subjective: Feeling much better today and back to baseline  Physical Exam: Vitals:   08/25/23 0516 08/25/23 0956 08/25/23 1318 08/25/23 1823  BP: 119/64 123/66 (!) 107/51 (!) 118/59  Pulse: 83 76 73 76  Resp: 17 17 17 17   Temp: 98.7 F (37.1 C) 97.9 F (36.6 C) 97.7 F (36.5 C) 98.1 F (36.7 C)  TempSrc: Oral Oral Oral Oral  SpO2: 98% 96% 96% 96%  Weight:      Height:       General exam: Awake, laying in bed, in nad Respiratory system: Normal respiratory effort, no wheezing Cardiovascular system: regular rate, s1, s2 Gastrointestinal system: Soft, nondistended, positive BS Central nervous system: CN2-12 grossly intact, strength intact Extremities: Perfused, no clubbing Skin: Normal skin turgor, no notable skin lesions seen Psychiatry: Mood normal // no visual hallucinations   Data Reviewed:  Labs reviewed: Na 139, K 3.5, Cr 0.80 , Hgb 9.7, Plts 94  Family Communication: Pt in room, family at bedside and over phone  Disposition: Status is: Observation The patient remains OBS appropriate and will d/c before 2 midnights.  Planned Discharge Destination: Home    Author: Cherylle Corwin, MD 08/25/2023 6:46 PM  For on call review [email or web address].

## 2023-08-25 NOTE — Care Management Obs Status (Signed)
 MEDICARE OBSERVATION STATUS NOTIFICATION   Patient Details  Name: Jack Powell MRN: [id number] Date of Birth: 1950-09-23   Medicare Observation Status Notification Given:  Yes  Pt fully oriented per epic, asking appropriate questions.  Elspeth Hals, LCSW 08/25/2023, 1:49 PM

## 2023-08-25 NOTE — Plan of Care (Signed)
   Problem: Education: Goal: Ability to describe self-care measures that may prevent or decrease complications (Diabetes Survival Skills Education) will improve Outcome: Progressing   Problem: Coping: Goal: Ability to adjust to condition or change in health will improve Outcome: Progressing   Problem: Fluid Volume: Goal: Ability to maintain a balanced intake and output will improve Outcome: Progressing   Problem: Health Behavior/Discharge Planning: Goal: Ability to identify and utilize available resources and services will improve Outcome: Progressing Goal: Ability to manage health-related needs will improve Outcome: Progressing   Problem: Nutritional: Goal: Maintenance of adequate nutrition will improve Outcome: Progressing

## 2023-08-25 NOTE — Evaluation (Signed)
 Physical Therapy Evaluation Patient Details Name: Jack Powell MRN: [id number] DOB: 06-10-1950 Today's Date: 08/25/2023  History of Present Illness  Pt is a 73 y.o. male admitted 4/16 for scheduled lumbar microdisectomy, However pt c/o of n/v, and lethargic, surgery postponed. CT showed no acute findings. PMH: anemia, cirrhosis, DM, HTN, skin cancer, wilson disease  Clinical Impression  Jack Powell is 73 y.o. male admitted with above HPI and diagnosis. Patient is currently limited by functional impairments below (see PT problem list). Patient lives with spouse and is independent with Lt AFO and RW at baseline. Currently he is mobilizing at supervision level with bed mob, transfers, and gait. Pt is mobilizing close to baseline and no obvious cognitive impairments noted this date. He expresses concerns about returning home quickly and then immediately back for surgery. Patient will benefit from continued skilled PT interventions to address impairments and progress independence with mobility. Acute PT will follow and progress as able.         If plan is discharge home, recommend the following: Help with stairs or ramp for entrance;Assist for transportation   Can travel by private vehicle        Equipment Recommendations    Recommendations for Other Services       Functional Status Assessment Patient has had a recent decline in their functional status and demonstrates the ability to make significant improvements in function in a reasonable and predictable amount of time.     Precautions / Restrictions Precautions Precautions: Fall Recall of Precautions/Restrictions: Intact Precaution/Restrictions Comments: Pt has AFO for L foot drop Restrictions Weight Bearing Restrictions Per Provider Order: No      Mobility  Bed Mobility Overal bed mobility: Needs Assistance Bed Mobility: Rolling, Sidelying to Sit, Sit to Supine Rolling: Supervision, Used rails Sidelying to sit:  Supervision, Used rails   Sit to supine: Supervision, Used rails   General bed mobility comments: cues for log roll for comfort at back. sup for safety.    Transfers Overall transfer level: Needs assistance Equipment used: Rolling walker (2 wheels) Transfers: Sit to/from Stand Sit to Stand: Supervision           General transfer comment: supervision with cues for hand placement for power up, no LOB noted    Ambulation/Gait Ambulation/Gait assistance: Contact guard assist Gait Distance (Feet): 200 Feet Assistive device: Rolling walker (2 wheels) Gait Pattern/deviations: Step-through pattern, Decreased stride length, Trunk flexed Gait velocity: decr     General Gait Details: slighlty flexed posture, cues for proximity to RW improved upright stance. good clearance on Lt LE with AFO donned. no buckling or overt LOB noted.  Stairs            Wheelchair Mobility     Tilt Bed    Modified Rankin (Stroke Patients Only)       Balance Overall balance assessment: Needs assistance Sitting-balance support: No upper extremity supported, Feet supported Sitting balance-Leahy Scale: Good Sitting balance - Comments: able to don shoes at EOB   Standing balance support: Bilateral upper extremity supported, During functional activity, Reliant on assistive device for balance Standing balance-Leahy Scale: Fair Standing balance comment: Reliant on RW                             Pertinent Vitals/Pain Pain Assessment Pain Assessment: Faces Faces Pain Scale: Hurts little more Pain Location: Back Pain Descriptors / Indicators: Aching, Constant, Discomfort Pain Intervention(s): Limited activity within patient's tolerance,  Monitored during session, Repositioned    Home Living Family/patient expects to be discharged to:: Private residence Living Arrangements: Spouse/significant other Available Help at Discharge: Family;Available 24 hours/day Type of Home: House Home  Access: Level entry       Home Layout: One level Home Equipment: Grab bars - toilet;Grab bars - tub/shower;Rolling Walker (2 wheels);Cane - single point;Shower seat - built in;Hand held shower head      Prior Function Prior Level of Function : Independent/Modified Independent             Mobility Comments: RW  some depending on pain       Extremity/Trunk Assessment   Upper Extremity Assessment Upper Extremity Assessment: Overall WFL for tasks assessed    Lower Extremity Assessment Lower Extremity Assessment: Generalized weakness (grossly 4-/5 throughout with greatest weakness at Lt dorsiflexors (AFO))    Cervical / Trunk Assessment Cervical / Trunk Assessment: Kyphotic  Communication   Communication Communication: No apparent difficulties    Cognition Arousal: Alert Behavior During Therapy: WFL for tasks assessed/performed   PT - Cognitive impairments: No apparent impairments                       PT - Cognition Comments: A&O x4, following conversation well and answering questions appropriately. Following commands: Impaired Following commands impaired: Follows one step commands with increased time     Cueing Cueing Techniques: Verbal cues     General Comments General comments (skin integrity, edema, etc.): VSS    Exercises     Assessment/Plan    PT Assessment Patient needs continued PT services  PT Problem List Decreased knowledge of use of DME;Decreased coordination;Decreased mobility;Decreased balance;Decreased activity tolerance;Decreased range of motion;Decreased strength;Pain       PT Treatment Interventions DME instruction;Gait training;Stair training;Functional mobility training;Therapeutic activities;Therapeutic exercise;Balance training;Neuromuscular re-education;Patient/family education    PT Goals (Current goals can be found in the Care Plan section)  Acute Rehab PT Goals Patient Stated Goal: have back surgery PT Goal Formulation:  With patient Time For Goal Achievement: 09/08/23 Potential to Achieve Goals: Good    Frequency Min 2X/week     Co-evaluation               AM-PAC PT "6 Clicks" Mobility  Outcome Measure Help needed turning from your back to your side while in a flat bed without using bedrails?: A Little Help needed moving from lying on your back to sitting on the side of a flat bed without using bedrails?: A Little Help needed moving to and from a bed to a chair (including a wheelchair)?: A Little Help needed standing up from a chair using your arms (e.g., wheelchair or bedside chair)?: A Little Help needed to walk in hospital room?: A Little Help needed climbing 3-5 steps with a railing? : A Little 6 Click Score: 18    End of Session Equipment Utilized During Treatment: Gait belt;Other (comment) (Lt LE AFO) Activity Tolerance: Patient tolerated treatment well Patient left: in bed;with call bell/phone within reach Nurse Communication: Mobility status PT Visit Diagnosis: Other abnormalities of gait and mobility (R26.89);Muscle weakness (generalized) (M62.81);Difficulty in walking, not elsewhere classified (R26.2);Other symptoms and signs involving the nervous system (R29.898);Pain Pain - part of body:  (back)    Time: 1610-9604 PT Time Calculation (min) (ACUTE ONLY): 24 min   Charges:   PT Evaluation $PT Eval Low Complexity: 1 Low PT Treatments $Gait Training: 8-22 mins PT General Charges $$ ACUTE PT VISIT: 1 Visit  Tish Forge, DPT Acute Rehabilitation Services Office [phone]  08/25/23 4:47 PM

## 2023-08-25 NOTE — Progress Notes (Signed)
 Subjective: Patient reports feeling much better  Objective: Vital signs in last 24 hours: Temp:  [98.2 F (36.8 C)-99 F (37.2 C)] 98.7 F (37.1 C) (04/17 0516) Pulse Rate:  [81-86] 83 (04/17 0516) Resp:  [16-18] 17 (04/17 0516) BP: (119-154)/(57-78) 119/64 (04/17 0516) SpO2:  [94 %-98 %] 98 % (04/17 0516)  Intake/Output from previous day: 04/16 0701 - 04/17 0700 In: 2244.7 [P.O.:360; I.V.:1309.7; Blood:575] Out: 600 [Urine:600] Intake/Output this shift: No intake/output data recorded.  Awake alert strength 5 out of 5 slight left foot drop at baseline  Lab Results: Recent Labs    08/24/23 0810  WBC 6.0  HGB 10.3*  HCT 30.5*  PLT 69*   BMET Recent Labs    08/24/23 0810  NA 138  K 3.3*  CL 100  CO2 23  GLUCOSE 220*  BUN 28*  CREATININE 0.78  CALCIUM 9.0    Studies/Results: CT HEAD WO CONTRAST ( ) Result Date: 08/24/2023 CLINICAL DATA:  Altered mental status.  No history of trauma. EXAM: CT HEAD WITHOUT CONTRAST TECHNIQUE: Contiguous axial images were obtained from the base of the skull through the vertex without intravenous contrast. RADIATION DOSE REDUCTION: This exam was performed according to the departmental dose-optimization program which includes automated exposure control, adjustment of the mA and/or kV according to patient size and/or use of iterative reconstruction technique. COMPARISON:  10/24/2017 FINDINGS: Brain: Mild age related volume loss. No subjective lobar predominance. No sign of acute infarction, mass lesion, hemorrhage, hydrocephalus or extra-axial collection. Vascular: No abnormal vascular finding. Skull: Negative Sinuses/Orbits: Clear/normal Other: None IMPRESSION: No acute or reversible finding. Mild age related volume loss. Electronically Signed   By: Bettylou Brunner M.D.   On: 08/24/2023 16:21    Assessment/Plan: Appreciate internal medicine's help continue workup when patient stabilized and cleared medically we will get the patient back on  the schedule for surgery hopefully maybe early next week.  LOS: 0 days     Ferris Hua 08/25/2023, 7:57 AM

## 2023-08-26 DIAGNOSIS — R64 Cachexia: Secondary | ICD-10-CM | POA: Diagnosis not present

## 2023-08-26 DIAGNOSIS — E43 Unspecified severe protein-calorie malnutrition: Secondary | ICD-10-CM | POA: Insufficient documentation

## 2023-08-26 LAB — COMPREHENSIVE METABOLIC PANEL WITH GFR
ALT: 46 U/L — ABNORMAL HIGH (ref 0–44)
AST: 40 U/L (ref 15–41)
Albumin: 2.9 g/dL — ABNORMAL LOW (ref 3.5–5.0)
Alkaline Phosphatase: 74 U/L (ref 38–126)
Anion gap: 9 (ref 5–15)
BUN: 17 mg/dL (ref 8–23)
CO2: 24 mmol/L (ref 22–32)
Calcium: 8.2 mg/dL — ABNORMAL LOW (ref 8.9–10.3)
Chloride: 104 mmol/L (ref 98–111)
Creatinine, Ser: 0.63 mg/dL (ref 0.61–1.24)
GFR, Estimated: >60 mL/min (ref >60)
Glucose, Bld: 265 mg/dL — ABNORMAL HIGH (ref 70–99)
Potassium: 3.7 mmol/L (ref 3.5–5.1)
Sodium: 137 mmol/L (ref 135–145)
Total Bilirubin: 1.9 mg/dL — ABNORMAL HIGH (ref 0.0–1.2)
Total Protein: 5 g/dL — ABNORMAL LOW (ref 6.5–8.1)

## 2023-08-26 LAB — AMMONIA: Ammonia: 89 umol/L — ABNORMAL HIGH (ref 9–35)

## 2023-08-26 LAB — CYTOLOGY - NON PAP

## 2023-08-26 LAB — GLUCOSE, CAPILLARY
Glucose-Capillary: 187 mg/dL — ABNORMAL HIGH (ref 70–99)
Glucose-Capillary: 309 mg/dL — ABNORMAL HIGH (ref 70–99)

## 2023-08-26 MED ORDER — TRIENTINE HCL 250 MG PO CAPS
500.0000 mg | ORAL_CAPSULE | Freq: Two times a day (BID) | ORAL | Status: DC
Start: 2023-08-26 — End: 2024-01-17

## 2023-08-26 MED ORDER — LACTULOSE ENCEPHALOPATHY 10 GM/15ML PO SOLN: 30.0000 g | Freq: Three times a day (TID) | ORAL | Status: DC

## 2023-08-26 NOTE — Discharge Summary (Signed)
 Physician Discharge Summary   Patient: Jack Powell MRN: [id number] DOB: [birth year]  Admit date:     08/24/2023  Discharge date: 08/26/23  Discharge Physician: Cherylle Corwin   PCP: Sari Cunning, MD   Recommendations at discharge:    Follow up with PCP In 1-2 weeks Pt advised to titrate lactulose  to goal of 2-3 BM daily  Follow up with cytology from paracentesis  Discharge Diagnoses: Principal Problem:   Cachexia (HCC) Active Problems:   Protein-calorie malnutrition, severe  Resolved Problems:   * No resolved hospital problems. *  Hospital Course: 73 y.o. 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 Per wife pt woke up confused.got him bed and he took PJ off and started walking saying getting socks.  Wife got him in the bathroom and asked him orientation question and didn't. know answers and got frustrated. Pt vomited in the car, and food debris and color unknown. Pt hadn't  eaten this am.  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   Patient was seen last week in neurosurgery clinic for left leg pain and foot.  Patient had been progressively looking thinner and had weight loss of 20 pounds in the past 3 weeks.  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   Patient was seen by hematology for preop clearance and was advised to get platelet transfusion before the surgery.Patient had colonoscopy on 10 April, path found to be neg  Assessment and Plan: >>Toxic metabolic encephalopathy likely secondary to hepatic encephalopathy -Ammonia  level elevated at presentation to just under 90, was in the mid-20's before -Lactulose  was resumed with marked improvement by the following day -Head CT done today shows no acute findings and age-related volume loss. -Pt is s/p paracentesis yielding 3.7L. FLuid analysis reviewed - rules out SBP. Cx neg thus far. Fluid sent for cytology which can be f/u as outpatient -No florid evidence of acute infection identified. Possible that recent sedation may have precipitated hepatic encephalopathy, as benzos have been associated with triggering encephalopathy. Recommend titrating up lactulose  to goal of 2-3BM daily. Pt report not taking lactulose  PTA -Also advised pt to be cautious with opiates as they are primarily metabolized by the liver. Recommend narcotics to be given very judiciously   >> Macrocytosis with anemia: -remained hemodynamically stable   >> Cachexia/ DM II: Suspect from pancreatic insufficiency. No clear history of any malignancy.  -Follow up on ascites cytology   >> Liver cirrhosis/ascites: Secondary to Wilson's disease.  -f/u on cytology, can be done as outpatient -fluid is neg for SBP          Consultants: Neurosurgery Procedures performed: Paracentesis  Disposition: Home Diet recommendation:  Carb modified diet DISCHARGE MEDICATION: Allergies as of 08/26/2023       Reactions   Ace Inhibitors Cough   Serotonin Other (See Comments)   hallucinations   Shellfish Allergy Other (See Comments)   Nuts  Chocolate   Penicillins Rash        Medication List     STOP taking these medications    ferrous gluconate  324 MG tablet Commonly known as: FERGON   omeprazole  40 MG  capsule Commonly known as: PRILOSEC   PARoxetine  20 MG tablet Commonly known as: PAXIL    potassium chloride  8 MEQ tablet Commonly known as: KLOR-CON    triamterene -hydrochlorothiazide  37.5-25 MG tablet Commonly known as: MAXZIDE -25       TAKE these medications    carvedilol  6.25 MG  tablet Commonly known as: COREG  Take 3.125 mg by mouth 2 (two) times daily with a meal.   cholecalciferol  1000 units tablet Commonly known as: VITAMIN D  Take 1,000 Units by mouth daily.   donepezil  10 MG tablet Commonly known as: ARICEPT  Take 5 mg by mouth at bedtime.   Dry Eye Relief Drops 0.2-0.2-1 % Soln Generic drug: Glycerin-Hypromellose-PEG 400 Place 1 drop into both eyes daily as needed (Dry eyes).   empagliflozin 10 MG Tabs tablet Commonly known as: JARDIANCE Take 5 mg by mouth 2 (two) times daily.   gabapentin  100 MG capsule Commonly known as: NEURONTIN  Take 100 mg by mouth 2 (two) times daily.   glipiZIDE 2.5 MG 24 hr tablet Commonly known as: GLUCOTROL XL Take 2.5 mg by mouth daily with breakfast.   ketoconazole  2 % shampoo Commonly known as: NIZORAL  Apply 1 Application topically 2 (two) times a week.   lactulose  (encephalopathy) 10 GM/15ML Soln Commonly known as: CHRONULAC  Take 45 mLs (30 g total) by mouth 3 (three) times daily. Goal to have 2-3 BM daily What changed:  how much to take when to take this reasons to take this additional instructions   lipase/protease/amylase [address] UNITS Cpep capsule Commonly known as: Creon  Take 2 capsules with the first bite of each meal and 1 capsule with the first bite of each snack   Magnesium  250 MG Tabs Take 250 mg by mouth daily.   MULTIPLE VITAMIN PO Take 1 tablet by mouth daily.   ondansetron  4 MG disintegrating tablet Commonly known as: ZOFRAN -ODT Take 4 mg by mouth every 8 (eight) hours as needed for nausea.   oxyCODONE  5 MG immediate release tablet Commonly known as: Oxy IR/ROXICODONE  Take 5 mg by mouth 2 (two) times daily.   pantoprazole  40 MG tablet Commonly known as: PROTONIX  Take 40 mg by mouth daily.   tamsulosin  0.4 MG Caps capsule Commonly known as: FLOMAX  Take 0.4 mg by mouth daily.   Trientine  HCl 250 MG Caps Commonly known as: Syprine  Take 2 capsules (500 mg total) by mouth 2 (two)  times daily. What changed: how much to take   Xifaxan  550 MG Tabs tablet Generic drug: rifaximin  Take 550 mg by mouth 2 (two) times daily.        Follow-up Information     Sari Cunning, MD Follow up.   Specialty: Internal Medicine Contact information: [address] [facility] [place] [address] [phone]                Discharge Exam: Jack Powell Weights   08/24/23 0717  Weight: 54.4 kg   General exam: Awake, laying in bed, in nad Respiratory system: Normal respiratory effort, no wheezing Cardiovascular system: regular rate, s1, s2 Gastrointestinal system: Soft, nondistended, positive BS Central nervous system: CN2-12 grossly intact, strength intact Extremities: Perfused, no clubbing Skin: Normal skin turgor, no notable skin lesions seen Psychiatry: Mood normal // no visual hallucinations   Condition at discharge: fair  The results of significant diagnostics from this hospitalization (including imaging, microbiology, ancillary and laboratory) are listed below for reference.   Imaging Studies: IR Paracentesis Result Date: 08/25/2023 INDICATION: Abdominal distention. Ascites. Request for diagnostic and therapeutic  paracentesis. EXAM: ULTRASOUND GUIDED RIGHT LOWER QUADRANT PARACENTESIS MEDICATIONS: 1% plain lidocaine , 5 mL COMPLICATIONS: None immediate. PROCEDURE: Informed written consent was obtained from the patient after a discussion of the risks, benefits and alternatives to treatment. A timeout was performed prior to the initiation of the procedure. Initial ultrasound scanning demonstrates a large amount of ascites within the right lower abdominal quadrant. The right lower abdomen was prepped and draped in the usual sterile fashion. 1% lidocaine  was used for local anesthesia. Following this, a 19 gauge, 7-cm, Yueh catheter was introduced. An ultrasound image was saved for documentation purposes. The paracentesis was performed. The  catheter was removed and a dressing was applied. The patient tolerated the procedure well without immediate post procedural complication. FINDINGS: A total of approximately 3.7 L of hazy, dark yellow fluid was removed. Samples were sent to the laboratory as requested by the clinical team. IMPRESSION: Successful ultrasound-guided paracentesis yielding 3.7 liters of peritoneal fluid. Procedure performed by Prudence Brown PA-C and supervised by Dr. Erica Hau Electronically Signed   By: Erica Hau M.D.   On: 08/25/2023 12:45   CT HEAD WO CONTRAST ( ) Result Date: 08/24/2023 CLINICAL DATA:  Altered mental status.  No history of trauma. EXAM: CT HEAD WITHOUT CONTRAST TECHNIQUE: Contiguous axial images were obtained from the base of the skull through the vertex without intravenous contrast. RADIATION DOSE REDUCTION: This exam was performed according to the departmental dose-optimization program which includes automated exposure control, adjustment of the mA and/or kV according to patient size and/or use of iterative reconstruction technique. COMPARISON:  10/24/2017 FINDINGS: Brain: Mild age related volume loss. No subjective lobar predominance. No sign of acute infarction, mass lesion, hemorrhage, hydrocephalus or extra-axial collection. Vascular: No abnormal vascular finding. Skull: Negative Sinuses/Orbits: Clear/normal Other: None IMPRESSION: No acute or reversible finding. Mild age related volume loss. Electronically Signed   By: Bettylou Brunner M.D.   On: 08/24/2023 16:21   EEG adult Result Date: 08/24/2023 Arleene Lack, MD     08/25/2023  8:16 AM Patient Name: Jack Powell MRN: [id number] Epilepsy Attending: Arleene Lack Referring Physician/Provider: Lavanda Porter, MD Date: 08/24/2023 Duration: 24.08 mins Patient history: 72yo m with ams. EEG to evaluate for seizure Level of alertness: Awake AEDs during EEG study: GBP Technical aspects: This EEG study was done with scalp electrodes  positioned according to the 10-20 International system of electrode placement. Electrical activity was reviewed with band pass filter of 1-70Hz , sensitivity of 7 uV/mm, display speed of 74mm/sec with a 60Hz  notched filter applied as appropriate. EEG data were recorded continuously and digitally stored.  Video monitoring was available and reviewed as appropriate. Description: EEG showed continuous generalized polymorphic 3 to 5 Hz theta-delta slowing, at times with triphasic morphology. Hyperventilation and photic stimulation were not performed.   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 No seizures or definite epileptiform discharges were seen throughout the recording. Arleene Lack   MR LUMBAR SPINE WO CONTRAST Result Date: 07/29/2023 CLINICAL DATA:  Lower back pain radiating down the left leg to the calf for 1 week. EXAM: MRI LUMBAR SPINE WITHOUT CONTRAST TECHNIQUE: Multiplanar, multisequence MR imaging of the lumbar spine was performed. No intravenous contrast was administered. COMPARISON:  None Available. FINDINGS: Segmentation:  5 non rib-bearing lumbar type vertebral bodies. Alignment: Lumbar lordosis is maintained. Trace retrolisthesis of L5 on S1. Vertebrae: Degenerative endplate changes and subtle discogenic edema at L5-S1. Bone marrow signal  intensity is otherwise unremarkable. No evidence of fracture. No suspicious osseous lesion. Conus medullaris and cauda equina: Conus extends to the L1-2 level. Conus and cauda equina appear normal. Paraspinal and other soft tissues: Mild edema in the paraspinal musculature of the lower lumbar spine. Disc levels: T12-L1: No significant disc bulge. No significant spinal canal stenosis or foraminal stenosis. L1-2: No significant disc bulge. No significant spinal canal stenosis or foraminal stenosis. L2-3: No significant disc bulge. No significant  spinal canal stenosis or foraminal stenosis. L3-4: No significant disc bulge. No significant spinal canal stenosis or foraminal stenosis. L4-5: Left subarticular disc protrusion which results in left lateral recess narrowing and likely contacts the traversing left L5 nerve roots. No significant spinal canal stenosis. Mild facet arthrosis. Mild left foraminal stenosis. L5-S1: Mild-to-moderate disc height loss. Signal abnormality in the left aspect of the disc likely related to degenerative changes. No significant spinal canal or foraminal stenosis. IMPRESSION: Left subarticular disc protrusion at L4-5 resulting in lateral recess narrowing. Disc protrusion likely contacts the traversing left L5 nerve roots. No high-grade spinal canal or foraminal stenosis. Electronically Signed   By: Denny Flack M.D.   On: 07/29/2023 14:11    Microbiology: Results for orders placed or performed during the hospital encounter of 08/24/23  Surgical pcr screen     Status: None   Collection Time: 08/24/23  9:17 AM   Specimen: Nasal Mucosa; Nasal Swab  Result Value Ref Range Status   MRSA, PCR NEGATIVE NEGATIVE Final   Staphylococcus aureus NEGATIVE NEGATIVE Final    Comment: (NOTE) The Xpert SA Assay (FDA approved for NASAL specimens in patients 63 years of age and older), is one component of a comprehensive surveillance program. It is not intended to diagnose infection nor to guide or monitor treatment. Performed at [facility], [address], [place], [place] [address]   Body fluid culture w Gram Stain     Status: None (Preliminary result)   Collection Time: 08/25/23 12:19 PM   Specimen: Abdomen; Peritoneal Fluid  Result Value Ref Range Status   Specimen Description PERITONEAL  Final   Special Requests NONE  Final   Gram Stain NO WBC SEEN NO ORGANISMS SEEN   Final   Culture   Final    NO GROWTH < 24 HOURS Performed at [facility], [address] [place], [place] [address]    Report Status  PENDING  Incomplete    Labs: CBC: Recent Labs  Lab 08/24/23 0810 08/25/23 0851  WBC 6.0 7.0  HGB 10.3* 9.7*  HCT 30.5* 30.1*  MCV 104.8* 107.1*  PLT 69* 94*   Basic Metabolic Panel: Recent Labs  Lab 08/24/23 0810 08/25/23 0851 08/26/23 0822  NA 138 139 137  K 3.3* 3.5 3.7  CL 100 106 104  CO2 23 24 24   GLUCOSE 220* 256* 265*  BUN 28* 21 17  CREATININE 0.78 0.80 0.63  CALCIUM 9.0 8.0* 8.2*   Liver Function Tests: Recent Labs  Lab 08/24/23 0810 08/25/23 0851 08/26/23 0822  AST 49* 38 40  ALT 50* 44 46*  ALKPHOS 106 70 74  BILITOT 1.6* 1.6* 1.9*  PROT 5.0* 4.7* 5.0*  ALBUMIN  3.0* 2.6* 2.9*   CBG: Recent Labs  Lab 08/25/23 1225 08/25/23 1650 08/25/23 2214 08/26/23 0722 08/26/23 1112  GLUCAP 278* 235* 213* 187* 309*    Discharge time spent: less than 30 minutes.  Signed: Cherylle Corwin, MD [facility] 08/26/2023

## 2023-08-26 NOTE — Progress Notes (Signed)
 Patient and spouse have been provided discharge instructions to include medications and follow up appointments.  They both verbalize understanding of instructions.

## 2023-08-26 NOTE — Inpatient Diabetes Management (Addendum)
 Inpatient Diabetes Program Recommendations  AACE/ADA: New Consensus Statement on Inpatient Glycemic Control (2015)  Target Ranges:  Prepandial:   less than 140 mg/dL      Peak postprandial:   less than 180 mg/dL (1-2 hours)      Critically ill patien

## 2023-08-26 NOTE — Plan of Care (Signed)
  Problem: Education: Goal: Ability to describe self-care measures that may prevent or decrease complications (Diabetes Survival Skills Education) will improve Outcome: Progressing Goal: Individualized Educational Video(s) Outcome: Progressing   Problem: Coping: Goal: Ability to adjust to condition or change in health will improve Outcome: Progressing   Problem: Fluid Volume: Goal: Ability to maintain a balanced intake and output will improve Outcome: Progressing   Problem: Health Behavior/Discharge Planning: Goal: Ability to identify and utilize available resources and services will improve Outcome: Progressing Goal: Ability to manage health-related needs will improve Outcome: Progressing   Problem: Metabolic: Goal: Ability to maintain appropriate glucose levels will improve Outcome: Progressing   Problem: Nutritional: Goal: Maintenance of adequate nutrition will improve Outcome: Progressing Goal: Progress toward achieving an optimal weight will improve Outcome: Progressing   Problem: Skin Integrity: Goal: Risk for impaired skin integrity will decrease Outcome: Progressing   Problem: Tissue Perfusion: Goal: Adequacy of tissue perfusion will improve Outcome: Progressing   Problem: Education: Goal: Knowledge of General Education information will improve Description: Including pain rating scale, medication(s)/side effects and non-pharmacologic comfort measures Outcome: Progressing   Problem: Health Behavior/Discharge Planning: Goal: Ability to manage health-related needs will improve Outcome: Progressing   Problem: Clinical Measurements: Goal: Ability to maintain clinical measurements within normal limits will improve Outcome: Progressing Goal: Will remain free from infection Outcome: Progressing Goal: Diagnostic test results will improve Outcome: Progressing Goal: Respiratory complications will improve Outcome: Progressing Goal: Cardiovascular complication will  be avoided Outcome: Progressing   Problem: Activity: Goal: Risk for activity intolerance will decrease Outcome: Progressing   Problem: Nutrition: Goal: Adequate nutrition will be maintained Outcome: Progressing   Problem: Coping: Goal: Level of anxiety will decrease Outcome: Progressing   Problem: Elimination: Goal: Will not experience complications related to bowel motility Outcome: Progressing Goal: Will not experience complications related to urinary retention Outcome: Progressing   Problem: Pain Managment: Goal: General experience of comfort will improve and/or be controlled Outcome: Progressing   Problem: Safety: Goal: Ability to remain free from injury will improve Outcome: Progressing   Problem: Skin Integrity: Goal: Risk for impaired skin integrity will decrease Outcome: Progressing   Problem: Education: Goal: Ability to verbalize activity precautions or restrictions will improve Outcome: Progressing Goal: Knowledge of the prescribed therapeutic regimen will improve Outcome: Progressing Goal: Understanding of discharge needs will improve Outcome: Progressing   Problem: Activity: Goal: Ability to avoid complications of mobility impairment will improve Outcome: Progressing Goal: Ability to tolerate increased activity will improve Outcome: Progressing Goal: Will remain free from falls Outcome: Progressing   Problem: Bowel/Gastric: Goal: Gastrointestinal status for postoperative course will improve Outcome: Progressing   Problem: Clinical Measurements: Goal: Ability to maintain clinical measurements within normal limits will improve Outcome: Progressing Goal: Postoperative complications will be avoided or minimized Outcome: Progressing Goal: Diagnostic test results will improve Outcome: Progressing   Problem: Pain Management: Goal: Pain level will decrease Outcome: Progressing   Problem: Skin Integrity: Goal: Will show signs of wound  healing Outcome: Progressing   Problem: Health Behavior/Discharge Planning: Goal: Identification of resources available to assist in meeting health care needs will improve Outcome: Progressing

## 2023-08-28 LAB — BODY FLUID CULTURE W GRAM STAIN
Culture: NO GROWTH
Gram Stain: NONE SEEN

## 2023-09-06 ENCOUNTER — Other Ambulatory Visit: Payer: Self-pay

## 2023-09-06 ENCOUNTER — Encounter (HOSPITAL_COMMUNITY): Payer: Self-pay | Admitting: Neurosurgery

## 2023-09-06 ENCOUNTER — Other Ambulatory Visit: Payer: Self-pay | Admitting: Neurosurgery

## 2023-09-06 NOTE — Progress Notes (Signed)
 Patient notified of change in arrival time for surgery on 09/07/2023. Patient to arrive at 0730. Patient verbalized new arrival time.

## 2023-09-06 NOTE — Progress Notes (Addendum)
 SDW CALL   Patient was given pre-op instructions over the phone. The opportunity was given for the patient and wife to ask questions. No further questions asked. Patient verbalized understanding of instructions given.     PCP - Dr. Firman Hughes Cardiologist - denies Oncologist - Yolande Hench - Rochele Christmas   PPM/ICD - denies     Chest x-ray - denies EKG - 08/24/23 Stress Test - denies ECHO - denies Cardiac Cath - denies   Sleep Study - patient had surgery in 2005 for sleep apnea and does not have sleep apnea at this time     Fasting Blood Sugar - 155 Checks Blood Sugar once a day or every other day   Last dose of Jardiance was morning of 4/14- patient was aware not to take another dose prior to surgery     Blood Thinner Instructions: n/a Aspirin Instructions: n/a   ERAS Protcol -  NPO     COVID TEST-  n/a     Anesthesia review: yes   Patient denies shortness of breath, fever, cough and chest pain over the phone call     All instructions explained to the patient, with a verbal understanding of the material. Patient agrees to go over the instructions while at home for a better understanding.     Teach back method used to confirm arrival time.    Left message at Dr. Bartholomew Light office to notify that patient had a CT completed yesterday and patient had not heard back from ordering physician.  Patient did not want results of that to postpone surgery.

## 2023-09-06 NOTE — Progress Notes (Signed)
 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 Surgery was initially scheduled for 09/03/23, but he presented with confusion/AMS, N/V, appeared cachectic. 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 Ammonia level 89 with improved mental status after resuming lactulose . Head CT showed no acute findings. Underwent paracentesis (3.7L) with negative culture.   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    He had labs per PCP Dr. Annabell Key on 09/02/23 (see LABS or Care Everywhere). Last office evaluation not by Dr. Annabell Key noted was on 08/10/23: "Left L5 lumbar myelopathy-progressive decline, very serious, no response to anti-inflammatories, steroids, ESI other than the initial anesthesia pre-ESI. This is a surgical situation. Physical therapy is not been helpful Needs urgent neurosurgery, pulm Dr. Lamon Pillow 4/10".   He denied prior cardiac testing. He reported that prior to  his back pain in March, he was walking the dog multiple times a day, mowing the lawn and never experienced any chest pain or shortness of breath. EKG from 08/24/23 showed NSR.  He had recent EGD and colonoscopy:  Colonoscopy 08/18/23: Impression: Preparation of the colon was fair. The examined portion of the ileum was normal.  Biopsied. (UNREMARKABLE COLONIC MUCOSA. NEGATIVE FOR MICROSCOPIC COLITIS, DYSPLASIA, AND MALIGNANCY.)  Normal mucosa of the left colon and in the right colon.  Biopsied. (UNREMARKABLE COLONIC MUCOSA. NEGATIVE FOR MICROSCOPIC COLITIS, DYSPLASIA, AND MALIGNANCY.)   EGD 08/18/23: Impression: Normal duodenal bulb and second portion of the duodenum.  Biopsied. (DUODENAL MUCOSA WITH FOCAL LYMPHANGIECTASIA, NON-SPECIFIC. NEGATIVE FOR FEATURES OF CELIAC, DYSPLASIA, AND MALIGNANCY.) Gastritis.  Biopsied. (ANTRAL MUCOSA WITH REACTIVE GASTRITIS. OXYNTIC MUCOSA WITH CHANGES CONSISTENT WITH PROTON PUMP INHIBITOR EFFECT AND SUPERFICIAL EDEMA. NEGATIVE FOR H. PYLORI, INTESTINAL METAPLASIA, DYSPLASIA, AND MALIGNANCY.)  Small (< 5 mm) esophageal varices.   Dr. Bartholomew Light office reached out to Dr. Randy Buttery for preoperative recommendations given pancytopenia including PLT count 55-81K since ~ 10/2018. Per notes, "Patient will be able to proceed with surgery after 'getting platelet transfusion right before surgery'."  Awaiting orders from Dr. Lamon Pillow.  Previously he had documented that number of units would be determined based on lab results. Currently most recent labs are from [facility]) showing platelet count 98K, PT/INR 16.7/1.5.   A1c 7.1% on 09/02/23 ([facility]). On Jardiance and glipizide. Last Jardiance 08/22/23 (was held during admission due to cachexia, concern for pancreatic insufficiency.    RN staff will advise him to arrive 3 hours prior to scheduled surgery since pre-operative platelet transfusion needed. Anesthesia team to evaluate on the day of surgery.  Waiting surgeon orders. He had labs  on 09/02/23, but will need a new  T&S. I will go a head and order repeat CBC given thrombocytopenia but defer additional lab orders to surgeon/anesthesiologist.    VS:  Wt Readings from Last 3 Encounters:  08/24/23 54.4 kg  08/18/23 54.4 kg  06/27/23 64.2 kg   BP Readings from Last 3 Encounters:  08/26/23 (!) 119/56  08/18/23 (!) 111/55  06/27/23 128/64   Pulse Readings from Last 3 Encounters:  08/26/23 80  08/18/23 70  06/27/23 60     PROVIDERS: Sari Cunning, MD is PCP  Rochele Christmas, MD is GI (hepatologist )with [facility] and Ellis Guys, MD is GI with [place] Seretha Dance, MD is HEM[facility]   LABS: Most recent lab results from PCP Dr. Annabell Key are from 09/02/23 (see [facility] Care Everywhere): Results include: WBC 3.5 (L), hemoglobin 10.1 (L), hematocrit 30.4 (L), platelet count 98 (L), glucose 175 (H), sodium 140, potassium 3.4 (L), BUN 22, creatinine 0.6 (L), calcium 8.6 (L), alkaline phosphatase 105 (H), albumin  3.3 (L), total bilirubin 1.4 (H), total protein 5 (L), AST 35, ALT 43, PT 16.7 (H), INR 1.5 (H), TSH 5.508 (H), A1c 7.1% (H).  Ammonia 89 on 08/26/23.    24 Hour Urine Copper  06/27/23 ([facility]): Copper , Urine Not Estab. ug/L 94                                 Detection Limit = 1  Creatinine, Urine 0.30 - 3.00 g/L 0.86                                 Detection Limit = 0.10  Copper /Creatinine Ratio 0 - 49 ug/g creat 109 High     Copper , 24 Hour Urine 3 - 35 ug/24 hr 119 High         IMAGES: US  Abd (limited) 09/05/23 ([facility]): IMPRESSION: Small to moderate volume ascites demonstrated in all four quadrants.  MRI L-spine 07/29/23: IMPRESSION: - Left subarticular disc protrusion at L4-5 resulting in lateral recess narrowing. Disc protrusion likely contacts the traversing left L5 nerve roots. - No high-grade spinal canal or foraminal stenosis.   MRI Abd 05/27/23 ([facility]): IMPRESSION: 1.  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 No high risk or worrisome features.  2.  Cirrhotic liver morphology without focal lesion.  3.  Sequela of portal hypertension including splenomegaly and moderate volume ascites.  4.  Bilateral lower lobe pulmonary nodules. Recommend follow-up chest CT to further evaluate.  - Paracentesis aborted:   This was scanned in all 4 quadrants. There was not significant enough ascites in order to attempt paracentesis.    US  Liver 01/14/23: IMPRESSION: 1. Patent portal vein with appropriate direction of flow. 2. Minimal perihepatic ascites and splenomegaly consistent with portal hypertension. 3. Cirrhotic liver morphology without focal hepatic lesion.     EKG: EKG 08/24/23:  Normal sinus rhythm Normal ECG When compared with ECG of 24-Aug-2000 10:40, No significant change was found Confirmed by Sheryle Donning [id number]) on 08/24/2023 8:06:04 AM  EKG 01/30/22 ([facility]): Unable to obtain copy of EKG tracing from [facility] when requested on 08/23/23, but per Narrative in Care Everywhere: Sinus rhythm  Incomplete left bundle branch block  Otherwise normal  No previous ECGs available  Confirmed by [place] Allan Ishihara  [phone]  on 01-30-2022 3 11 22  PM    CV: N/A   Past  Medical History:  Diagnosis Date   Anemia    Cirrhosis (HCC)    Diabetes mellitus without complication (HCC)    Esophagitis    GERD (gastroesophageal reflux disease)    History of colon polyps    Hypertension    PONV (postoperative nausea and vomiting)    Skin cancer    Sleep apnea    Wilson disease     Past Surgical History:  Procedure Laterality Date   CATARACT EXTRACTION W/PHACO Right 02/27/2020   Procedure: CATARACT EXTRACTION PHACO AND INTRAOCULAR LENS PLACEMENT (IOC) RIGHT DIABETIC 3.78   00:55.4 6.8%;  Surgeon: Annell Kidney, MD;  Location: [facility];  Service: Ophthalmology;  Laterality: Right;  Diabetic - oral meds   CATARACT EXTRACTION W/PHACO Left  03/12/2020   Procedure: CATARACT EXTRACTION PHACO AND INTRAOCULAR LENS PLACEMENT (IOC) LEFT DIABETIC 1.54 00:29.3 5.2%;  Surgeon: Annell Kidney, MD;  Location: [facility];  Service: Ophthalmology;  Laterality: Left;  patient prefers arrival after 10am   COLONOSCOPY     COLONOSCOPY WITH PROPOFOL  N/A 08/18/2023   Procedure: COLONOSCOPY WITH PROPOFOL ;  Surgeon: Selena Daily, MD;  Location: [facility] ENDOSCOPY;  Service: Gastroenterology;  Laterality: N/A;   ESOPHAGEAL BANDING  01/13/2023   Procedure: ESOPHAGEAL BANDING;  Surgeon: Selena Daily, MD;  Location: [facility] ENDOSCOPY;  Service: Gastroenterology;;   ESOPHAGEAL BANDING  02/24/2023   Procedure: ESOPHAGEAL BANDING;  Surgeon: Selena Daily, MD;  Location: [facility] ENDOSCOPY;  Service: Gastroenterology;;   ESOPHAGEAL BRUSHING  04/21/2023   Procedure: ESOPHAGEAL BRUSHING;  Surgeon: Selena Daily, MD;  Location: [facility];  Service: Gastroenterology;;   ESOPHAGOGASTRODUODENOSCOPY N/A 01/13/2023   Procedure: ESOPHAGOGASTRODUODENOSCOPY (EGD);  Surgeon: Selena Daily, MD;  Location: [facility] ENDOSCOPY;  Service: Gastroenterology;  Laterality: N/A;   ESOPHAGOGASTRODUODENOSCOPY (EGD) WITH PROPOFOL  N/A 05/05/2016   Procedure: ESOPHAGOGASTRODUODENOSCOPY (EGD) WITH PROPOFOL ;  Surgeon: Cassie Click, MD;  Location: [facility] ENDOSCOPY;  Service: Endoscopy;  Laterality: N/A;   ESOPHAGOGASTRODUODENOSCOPY (EGD) WITH PROPOFOL  N/A 02/24/2023   Procedure: ESOPHAGOGASTRODUODENOSCOPY (EGD) WITH PROPOFOL ;  Surgeon: Selena Daily, MD;  Location: [facility] ENDOSCOPY;  Service: Gastroenterology;  Laterality: N/A;   ESOPHAGOGASTRODUODENOSCOPY (EGD) WITH PROPOFOL  N/A 04/21/2023   Procedure: ESOPHAGOGASTRODUODENOSCOPY (EGD) WITH PROPOFOL ;  Surgeon: Selena Daily, MD;  Location: [facility] ENDOSCOPY;  Service: Gastroenterology;  Laterality: N/A;   ESOPHAGOGASTRODUODENOSCOPY (EGD) WITH PROPOFOL  N/A 08/18/2023   Procedure:  ESOPHAGOGASTRODUODENOSCOPY (EGD) WITH PROPOFOL ;  Surgeon: Selena Daily, MD;  Location: [facility] ENDOSCOPY;  Service: Gastroenterology;  Laterality: N/A;   EYE SURGERY     HOT HEMOSTASIS  01/13/2023   Procedure: HOT HEMOSTASIS (ARGON PLASMA COAGULATION/BICAP);  Surgeon: Selena Daily, MD;  Location: [facility] ENDOSCOPY;  Service: Gastroenterology;;   HOT HEMOSTASIS  02/24/2023   Procedure: HOT HEMOSTASIS (ARGON PLASMA COAGULATION/BICAP);  Surgeon: Selena Daily, MD;  Location: [facility] ENDOSCOPY;  Service: Gastroenterology;;   IR BONE MARROW BIOPSY & ASPIRATION  06/10/2023   IR PARACENTESIS  08/25/2023   LIVER BIOPSY     SKIN CANCER EXCISION     TONSILLECTOMY     UVULOPALATOPHARYNGOPLASTY  2005    MEDICATIONS: No current facility-administered medications for this encounter.    furosemide (LASIX) 20 MG tablet   spironolactone (ALDACTONE) 50 MG tablet   carvedilol  (COREG ) 6.25 MG tablet   cholecalciferol  (VITAMIN D ) 1000 units tablet   donepezil  (ARICEPT ) 10 MG tablet   empagliflozin (JARDIANCE) 10 MG TABS tablet   gabapentin  (NEURONTIN ) 100 MG capsule   glipiZIDE (GLUCOTROL XL) 2.5 MG 24 hr tablet  Glycerin-Hypromellose-PEG 400 (DRY EYE RELIEF DROPS) 0.2-0.2-1 % SOLN   ketoconazole  (NIZORAL ) 2 % shampoo   lactulose , encephalopathy, (CHRONULAC ) 10 GM/15ML SOLN   lipase/protease/amylase (CREON ) 36000 UNITS CPEP capsule   Magnesium  250 MG TABS   MULTIPLE VITAMIN PO   ondansetron  (ZOFRAN -ODT) 4 MG disintegrating tablet   pantoprazole  (PROTONIX ) 40 MG tablet   rifaximin  (XIFAXAN ) 550 MG TABS tablet   tamsulosin  (FLOMAX ) 0.4 MG CAPS capsule   Trientine  HCl (SYPRINE ) 250 MG CAPS    Ella Gun, PA-C Surgical Short Stay/Anesthesiology [facility] Phone [phone] [facility] Phone [phone] 09/06/2023 1:00 PM

## 2023-09-06 NOTE — Anesthesia Preprocedure Evaluation (Signed)
 Anesthesia Evaluation  Patient identified by MRN, date of birth, ID band Patient awake    Reviewed: Allergy & Precautions, NPO status , Patient's Chart, lab work & pertinent test results  History of Anesthesia Complications (+) PONV and history of anesthetic complications  Airway Mallampati: II  TM Distance: <3 FB Neck ROM: Full    Dental  (+) Teeth Intact, Dental Advisory Given   Pulmonary sleep apnea (OSA (s/p UPPP 2005))    Pulmonary exam normal breath sounds clear to auscultation       Cardiovascular hypertension, Normal cardiovascular exam Rhythm:Regular Rate:Normal     Neuro/Psych Herniated lumbar disc without myelopathy  negative psych ROS   GI/Hepatic PUD,GERD  ,,(+) Cirrhosis       Wilson disease Esophagitis    Endo/Other  diabetes, Type 2, Oral Hypoglycemic Agents    Renal/GU negative Renal ROS     Musculoskeletal negative musculoskeletal ROS (+)    Abdominal   Peds  Hematology  (+) Blood dyscrasia (Plt 94k), anemia   Anesthesia Other Findings Day of surgery medications reviewed with the patient.  Reproductive/Obstetrics                             Anesthesia Physical Anesthesia Plan  ASA: 3  Anesthesia Plan: General   Post-op Pain Management:    Induction: Intravenous  PONV Risk Score and Plan: 3 and Dexamethasone  and Ondansetron   Airway Management Planned: Oral ETT and Video Laryngoscope Planned  Additional Equipment:   Intra-op Plan:   Post-operative Plan: Extubation in OR  Informed Consent: I have reviewed the patients History and Physical, chart, labs and discussed the procedure including the risks, benefits and alternatives for the proposed anesthesia with the patient or authorized representative who has indicated his/her understanding and acceptance.     Dental advisory given  Plan Discussed with: CRNA  Anesthesia Plan Comments: (NO VERSED .    PAT note written 09/06/2023 by Leontina Skidmore, PA-C. 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    HEM Dr. Randy Buttery advised platelet transfusion before surgery.   Canceled on 08/24/23 due to AMS. See anesthesiologist note and admission records.    )       Anesthesia Quick Evaluation

## 2023-09-07 ENCOUNTER — Encounter (HOSPITAL_COMMUNITY)
Admission: RE | Disposition: A | Discharge status: Home / Self Care | Payer: Self-pay | Source: Home / Self Care | Attending: Neurosurgery

## 2023-09-07 ENCOUNTER — Other Ambulatory Visit: Payer: Self-pay

## 2023-09-07 ENCOUNTER — Ambulatory Visit (HOSPITAL_BASED_OUTPATIENT_CLINIC_OR_DEPARTMENT_OTHER): Payer: Self-pay | Admitting: Vascular Surgery

## 2023-09-07 ENCOUNTER — Ambulatory Visit (HOSPITAL_COMMUNITY)

## 2023-09-07 ENCOUNTER — Ambulatory Visit (HOSPITAL_COMMUNITY)
Admission: RE | Admit: 2023-09-07 | Discharge: 2023-09-08 | Disposition: A | Discharge status: Home / Self Care | Attending: Neurosurgery | Admitting: Neurosurgery

## 2023-09-07 ENCOUNTER — Ambulatory Visit (HOSPITAL_COMMUNITY): Payer: Self-pay | Admitting: Vascular Surgery

## 2023-09-07 DIAGNOSIS — G4733 Obstructive sleep apnea (adult) (pediatric): Secondary | ICD-10-CM | POA: Insufficient documentation

## 2023-09-07 DIAGNOSIS — K7460 Unspecified cirrhosis of liver: Secondary | ICD-10-CM | POA: Insufficient documentation

## 2023-09-07 DIAGNOSIS — D696 Thrombocytopenia, unspecified: Secondary | ICD-10-CM

## 2023-09-07 DIAGNOSIS — E119 Type 2 diabetes mellitus without complications: Secondary | ICD-10-CM | POA: Insufficient documentation

## 2023-09-07 DIAGNOSIS — K219 Gastro-esophageal reflux disease without esophagitis: Secondary | ICD-10-CM | POA: Insufficient documentation

## 2023-09-07 DIAGNOSIS — I10 Essential (primary) hypertension: Secondary | ICD-10-CM

## 2023-09-07 DIAGNOSIS — Z7984 Long term (current) use of oral hypoglycemic drugs: Secondary | ICD-10-CM | POA: Insufficient documentation

## 2023-09-07 DIAGNOSIS — D61818 Other pancytopenia: Secondary | ICD-10-CM

## 2023-09-07 DIAGNOSIS — M5126 Other intervertebral disc displacement, lumbar region: Secondary | ICD-10-CM

## 2023-09-07 HISTORY — PX: LUMBAR LAMINECTOMY/DECOMPRESSION MICRODISCECTOMY: SHX5026

## 2023-09-07 LAB — CBC
HCT: 31.7 % — ABNORMAL LOW (ref 39.0–52.0)
Hemoglobin: 10.6 g/dL — ABNORMAL LOW (ref 13.0–17.0)
MCH: 34.3 pg — ABNORMAL HIGH (ref 26.0–34.0)
MCHC: 33.4 g/dL (ref 30.0–36.0)
MCV: 102.6 fL — ABNORMAL HIGH (ref 80.0–100.0)
Platelets: 72 10*3/uL — ABNORMAL LOW (ref 150–400)
RBC: 3.09 MIL/uL — ABNORMAL LOW (ref 4.22–5.81)
RDW: 16.9 % — ABNORMAL HIGH (ref 11.5–15.5)
WBC: 3.7 10*3/uL — ABNORMAL LOW (ref 4.0–10.5)
nRBC: 0 % (ref 0.0–0.2)

## 2023-09-07 LAB — GLUCOSE, CAPILLARY
Glucose-Capillary: 152 mg/dL — ABNORMAL HIGH (ref 70–99)
Glucose-Capillary: 150 mg/dL — ABNORMAL HIGH (ref 70–99)
Glucose-Capillary: 130 mg/dL — ABNORMAL HIGH (ref 70–99)
Glucose-Capillary: 305 mg/dL — ABNORMAL HIGH (ref 70–99)
Glucose-Capillary: 286 mg/dL — ABNORMAL HIGH (ref 70–99)

## 2023-09-07 LAB — SURGICAL PCR SCREEN
MRSA, PCR: NEGATIVE
Staphylococcus aureus: NEGATIVE

## 2023-09-07 SURGERY — LUMBAR LAMINECTOMY/DECOMPRESSION MICRODISCECTOMY 1 LEVEL
Anesthesia: General | Site: Back | Laterality: Left

## 2023-09-07 MED ORDER — CEFAZOLIN SODIUM-DEXTROSE 2-4 GM/100ML-% IV SOLN
2.0000 g | Freq: Once | INTRAVENOUS | Status: AC
  Administered 2023-09-07: 2 g via INTRAVENOUS

## 2023-09-07 MED ORDER — PROPOFOL 10 MG/ML IV BOLUS
INTRAVENOUS | Status: AC
  Filled 2023-09-07: qty 20

## 2023-09-07 MED ORDER — BUPIVACAINE HCL (PF) 0.25 % IJ SOLN
INTRAMUSCULAR | Status: AC
  Filled 2023-09-07: qty 30

## 2023-09-07 MED ORDER — GLIPIZIDE ER 2.5 MG PO TB24
2.5000 mg | ORAL_TABLET | Freq: Every day | ORAL | Status: DC
  Administered 2023-09-08: 2.5 mg via ORAL
  Filled 2023-09-07: qty 1

## 2023-09-07 MED ORDER — FENTANYL CITRATE (PF) 100 MCG/2ML IJ SOLN: 25.0000 ug | INTRAMUSCULAR | Status: DC | PRN

## 2023-09-07 MED ORDER — PANCRELIPASE (LIP-PROT-AMYL) 36000-114000 UNITS PO CPEP: 36000.0000 [IU] | ORAL_CAPSULE | ORAL | Status: DC | PRN

## 2023-09-07 MED ORDER — SODIUM CHLORIDE 0.9% FLUSH
3.0000 mL | Freq: Two times a day (BID) | INTRAVENOUS | Status: DC
  Administered 2023-09-07 – 2023-09-08 (×2): 3 mL via INTRAVENOUS

## 2023-09-07 MED ORDER — TAMSULOSIN HCL 0.4 MG PO CAPS
0.4000 mg | ORAL_CAPSULE | Freq: Every day | ORAL | Status: DC
  Administered 2023-09-07: 0.4 mg via ORAL
  Filled 2023-09-07 (×2): qty 1

## 2023-09-07 MED ORDER — FENTANYL CITRATE (PF) 250 MCG/5ML IJ SOLN
INTRAMUSCULAR | Status: AC
  Filled 2023-09-07: qty 5

## 2023-09-07 MED ORDER — MAGNESIUM 250 MG PO TABS: 250.0000 mg | ORAL_TABLET | Freq: Every day | ORAL | Status: DC

## 2023-09-07 MED ORDER — GABAPENTIN 100 MG PO CAPS
100.0000 mg | ORAL_CAPSULE | Freq: Two times a day (BID) | ORAL | Status: DC
  Administered 2023-09-07 – 2023-09-08 (×2): 100 mg via ORAL
  Filled 2023-09-07 (×2): qty 1

## 2023-09-07 MED ORDER — TRIENTINE HCL 250 MG PO CAPS
500.0000 mg | ORAL_CAPSULE | Freq: Two times a day (BID) | ORAL | Status: DC
Start: 2023-09-07 — End: 2023-09-08

## 2023-09-07 MED ORDER — INSULIN ASPART 100 UNIT/ML IJ SOLN: 0.0000 [IU] | INTRAMUSCULAR | Status: DC | PRN

## 2023-09-07 MED ORDER — MAGNESIUM OXIDE -MG SUPPLEMENT 400 (240 MG) MG PO TABS
400.0000 mg | ORAL_TABLET | Freq: Every day | ORAL | Status: DC
  Administered 2023-09-07 – 2023-09-08 (×2): 400 mg via ORAL
  Filled 2023-09-07 (×2): qty 1

## 2023-09-07 MED ORDER — HYDROCODONE-ACETAMINOPHEN 5-325 MG PO TABS
2.0000 | ORAL_TABLET | ORAL | Status: DC | PRN
  Administered 2023-09-07: 2 via ORAL
  Filled 2023-09-07: qty 2

## 2023-09-07 MED ORDER — ACETAMINOPHEN 650 MG RE SUPP: 650.0000 mg | RECTAL | Status: DC | PRN

## 2023-09-07 MED ORDER — ONDANSETRON HCL 4 MG/2ML IJ SOLN
INTRAMUSCULAR | Status: AC
  Filled 2023-09-07: qty 2

## 2023-09-07 MED ORDER — ROCURONIUM BROMIDE 10 MG/ML (PF) SYRINGE
PREFILLED_SYRINGE | INTRAVENOUS | Status: DC | PRN
Start: 2023-09-07 — End: 2023-09-07
  Administered 2023-09-07: 20 mg via INTRAVENOUS
  Administered 2023-09-07: 50 mg via INTRAVENOUS
  Administered 2023-09-07: 10 mg via INTRAVENOUS

## 2023-09-07 MED ORDER — SPIRONOLACTONE 25 MG PO TABS
50.0000 mg | ORAL_TABLET | Freq: Every day | ORAL | Status: DC
  Administered 2023-09-07: 50 mg via ORAL
  Filled 2023-09-07 (×2): qty 2

## 2023-09-07 MED ORDER — LIDOCAINE-EPINEPHRINE 1 %-1:100000 IJ SOLN
INTRAMUSCULAR | Status: AC
  Filled 2023-09-07: qty 1

## 2023-09-07 MED ORDER — VITAMIN D 25 MCG (1000 UNIT) PO TABS
1000.0000 [IU] | ORAL_TABLET | Freq: Every day | ORAL | Status: DC
  Administered 2023-09-07 – 2023-09-08 (×2): 1000 [IU] via ORAL
  Filled 2023-09-07 (×2): qty 1

## 2023-09-07 MED ORDER — PHENYLEPHRINE HCL-NACL 20-0.9 MG/250ML-% IV SOLN
INTRAVENOUS | Status: DC | PRN
  Administered 2023-09-07: 25 ug/min via INTRAVENOUS

## 2023-09-07 MED ORDER — FUROSEMIDE 40 MG PO TABS
40.0000 mg | ORAL_TABLET | Freq: Two times a day (BID) | ORAL | Status: DC
  Administered 2023-09-07 – 2023-09-08 (×2): 40 mg via ORAL
  Filled 2023-09-07 (×3): qty 1

## 2023-09-07 MED ORDER — THROMBIN 5000 UNITS EX SOLR
CUTANEOUS | Status: DC | PRN
  Administered 2023-09-07: 10000 [IU] via TOPICAL

## 2023-09-07 MED ORDER — ACETAMINOPHEN 325 MG PO TABS: 650.0000 mg | ORAL_TABLET | ORAL | Status: DC | PRN

## 2023-09-07 MED ORDER — GLYCERIN-HYPROMELLOSE-PEG 400 0.2-0.2-1 % OP SOLN: 1.0000 [drp] | Freq: Every day | OPHTHALMIC | Status: DC | PRN

## 2023-09-07 MED ORDER — LACTATED RINGERS IV SOLN: INTRAVENOUS | Status: DC

## 2023-09-07 MED ORDER — PANTOPRAZOLE SODIUM 40 MG IV SOLR: 40.0000 mg | Freq: Every day | INTRAVENOUS | Status: DC

## 2023-09-07 MED ORDER — HYDROCODONE-ACETAMINOPHEN 5-325 MG PO TABS
1.0000 | ORAL_TABLET | ORAL | Status: DC | PRN
  Administered 2023-09-08: 2 via ORAL
  Administered 2023-09-08 (×2): 1 via ORAL
  Filled 2023-09-07 (×2): qty 1
  Filled 2023-09-07: qty 2

## 2023-09-07 MED ORDER — CYCLOBENZAPRINE HCL 10 MG PO TABS
10.0000 mg | ORAL_TABLET | Freq: Three times a day (TID) | ORAL | Status: DC | PRN
  Administered 2023-09-07 – 2023-09-08 (×3): 10 mg via ORAL
  Filled 2023-09-07 (×3): qty 1

## 2023-09-07 MED ORDER — ROCURONIUM BROMIDE 10 MG/ML (PF) SYRINGE
PREFILLED_SYRINGE | INTRAVENOUS | Status: AC
  Filled 2023-09-07: qty 10

## 2023-09-07 MED ORDER — KETOCONAZOLE 2 % EX SHAM
1.0000 | MEDICATED_SHAMPOO | CUTANEOUS | Status: DC
  Filled 2023-09-07: qty 120

## 2023-09-07 MED ORDER — SODIUM CHLORIDE 0.9% FLUSH: 3.0000 mL | INTRAVENOUS | Status: DC | PRN

## 2023-09-07 MED ORDER — PANCRELIPASE (LIP-PROT-AMYL) 36000-114000 UNITS PO CPEP
72000.0000 [IU] | ORAL_CAPSULE | Freq: Three times a day (TID) | ORAL | Status: DC
  Administered 2023-09-07 – 2023-09-08 (×2): 72000 [IU] via ORAL
  Filled 2023-09-07 (×3): qty 2

## 2023-09-07 MED ORDER — INSULIN ASPART 100 UNIT/ML IJ SOLN
0.0000 [IU] | Freq: Three times a day (TID) | INTRAMUSCULAR | Status: DC
  Administered 2023-09-08: 5 [IU] via SUBCUTANEOUS

## 2023-09-07 MED ORDER — SENNA 8.6 MG PO TABS
1.0000 | ORAL_TABLET | Freq: Two times a day (BID) | ORAL | Status: DC | PRN
  Administered 2023-09-07: 8.6 mg via ORAL
  Filled 2023-09-07: qty 1

## 2023-09-07 MED ORDER — ACETAMINOPHEN 500 MG PO TABS: 1000.0000 mg | ORAL_TABLET | Freq: Once | ORAL | Status: DC

## 2023-09-07 MED ORDER — PHENYLEPHRINE 80 MCG/ML (10ML) SYRINGE FOR IV PUSH (FOR BLOOD PRESSURE SUPPORT)
PREFILLED_SYRINGE | INTRAVENOUS | Status: DC | PRN
  Administered 2023-09-07: 80 ug via INTRAVENOUS
  Administered 2023-09-07: 160 ug via INTRAVENOUS

## 2023-09-07 MED ORDER — ONDANSETRON HCL 4 MG/2ML IJ SOLN
INTRAMUSCULAR | Status: DC | PRN
  Administered 2023-09-07: 4 mg via INTRAVENOUS

## 2023-09-07 MED ORDER — ALUM & MAG HYDROXIDE-SIMETH 200-200-20 MG/5ML PO SUSP: 30.0000 mL | Freq: Four times a day (QID) | ORAL | Status: DC | PRN

## 2023-09-07 MED ORDER — HYDROMORPHONE HCL 1 MG/ML IJ SOLN
0.5000 mg | INTRAMUSCULAR | Status: DC | PRN
  Administered 2023-09-07 (×2): 0.5 mg via INTRAVENOUS
  Filled 2023-09-07: qty 0.5

## 2023-09-07 MED ORDER — DEXAMETHASONE SODIUM PHOSPHATE 10 MG/ML IJ SOLN
INTRAMUSCULAR | Status: DC | PRN
  Administered 2023-09-07: 10 mg via INTRAVENOUS

## 2023-09-07 MED ORDER — INSULIN ASPART 100 UNIT/ML IJ SOLN
0.0000 [IU] | Freq: Every day | INTRAMUSCULAR | Status: DC
  Administered 2023-09-07: 3 [IU] via SUBCUTANEOUS

## 2023-09-07 MED ORDER — ORAL CARE MOUTH RINSE: 15.0000 mL | Freq: Once | OROMUCOSAL | Status: AC

## 2023-09-07 MED ORDER — EMPAGLIFLOZIN 10 MG PO TABS: 10.0000 mg | ORAL_TABLET | Freq: Two times a day (BID) | ORAL | Status: DC

## 2023-09-07 MED ORDER — PROPOFOL 10 MG/ML IV BOLUS
INTRAVENOUS | Status: DC | PRN
  Administered 2023-09-07: 100 mg via INTRAVENOUS

## 2023-09-07 MED ORDER — CHLORHEXIDINE GLUCONATE 0.12 % MT SOLN
15.0000 mL | Freq: Once | OROMUCOSAL | Status: AC
  Administered 2023-09-07: 15 mL via OROMUCOSAL
  Filled 2023-09-07: qty 15

## 2023-09-07 MED ORDER — LACTULOSE ENCEPHALOPATHY 10 GM/15ML PO SOLN
30.0000 g | Freq: Three times a day (TID) | ORAL | Status: DC
  Filled 2023-09-07 (×2): qty 45

## 2023-09-07 MED ORDER — SODIUM CHLORIDE 0.9 % IV SOLN
250.0000 mL | INTRAVENOUS | Status: DC
Start: 2023-09-07 — End: 2023-09-08

## 2023-09-07 MED ORDER — HEMOSTATIC AGENTS (NO CHARGE) OPTIME
TOPICAL | Status: DC | PRN
  Administered 2023-09-07: 1 via TOPICAL

## 2023-09-07 MED ORDER — CARVEDILOL 3.125 MG PO TABS
3.1250 mg | ORAL_TABLET | Freq: Two times a day (BID) | ORAL | Status: DC
  Administered 2023-09-07 – 2023-09-08 (×2): 3.125 mg via ORAL
  Filled 2023-09-07 (×2): qty 1

## 2023-09-07 MED ORDER — PANCRELIPASE (LIP-PROT-AMYL) 36000-114000 UNITS PO CPEP: 36000.0000 [IU] | ORAL_CAPSULE | Freq: Three times a day (TID) | ORAL | Status: DC

## 2023-09-07 MED ORDER — LIDOCAINE 2% (20 MG/ML) 5 ML SYRINGE
INTRAMUSCULAR | Status: AC
  Filled 2023-09-07: qty 5

## 2023-09-07 MED ORDER — DOCUSATE SODIUM 100 MG PO CAPS
100.0000 mg | ORAL_CAPSULE | Freq: Two times a day (BID) | ORAL | Status: DC | PRN
  Administered 2023-09-07: 100 mg via ORAL
  Filled 2023-09-07: qty 1

## 2023-09-07 MED ORDER — SUGAMMADEX SODIUM 200 MG/2ML IV SOLN
INTRAVENOUS | Status: DC | PRN
  Administered 2023-09-07: 200 mg via INTRAVENOUS

## 2023-09-07 MED ORDER — GLIPIZIDE ER 2.5 MG PO TB24
2.5000 mg | ORAL_TABLET | Freq: Every day | ORAL | Status: DC
  Filled 2023-09-07: qty 1

## 2023-09-07 MED ORDER — BUPIVACAINE HCL (PF) 0.25 % IJ SOLN
INTRAMUSCULAR | Status: DC | PRN
  Administered 2023-09-07: 10 mL

## 2023-09-07 MED ORDER — RIFAXIMIN 550 MG PO TABS
550.0000 mg | ORAL_TABLET | Freq: Two times a day (BID) | ORAL | Status: DC
Start: 2023-09-07 — End: 2023-09-08
  Administered 2023-09-07 – 2023-09-08 (×2): 550 mg via ORAL
  Filled 2023-09-07 (×2): qty 1

## 2023-09-07 MED ORDER — INSULIN ASPART 100 UNIT/ML IJ SOLN
0.0000 [IU] | Freq: Three times a day (TID) | INTRAMUSCULAR | Status: DC
  Administered 2023-09-07: 7 [IU] via SUBCUTANEOUS

## 2023-09-07 MED ORDER — PHENOL 1.4 % MT LIQD: 1.0000 | OROMUCOSAL | Status: DC | PRN

## 2023-09-07 MED ORDER — THROMBIN 5000 UNITS EX KIT
PACK | CUTANEOUS | Status: AC
  Filled 2023-09-07: qty 2

## 2023-09-07 MED ORDER — LIDOCAINE-EPINEPHRINE 1 %-1:100000 IJ SOLN
INTRAMUSCULAR | Status: DC | PRN
  Administered 2023-09-07: 9 mL

## 2023-09-07 MED ORDER — ONDANSETRON HCL 4 MG/2ML IJ SOLN: 4.0000 mg | Freq: Once | INTRAMUSCULAR | Status: DC | PRN

## 2023-09-07 MED ORDER — FENTANYL CITRATE (PF) 250 MCG/5ML IJ SOLN
INTRAMUSCULAR | Status: DC | PRN
  Administered 2023-09-07: 50 ug via INTRAVENOUS

## 2023-09-07 MED ORDER — DEXAMETHASONE SODIUM PHOSPHATE 10 MG/ML IJ SOLN
INTRAMUSCULAR | Status: AC
  Filled 2023-09-07: qty 1

## 2023-09-07 MED ORDER — ADULT MULTIVITAMIN W/MINERALS CH
1.0000 | ORAL_TABLET | Freq: Every day | ORAL | Status: DC
  Administered 2023-09-07 – 2023-09-08 (×2): 1 via ORAL
  Filled 2023-09-07 (×2): qty 1

## 2023-09-07 MED ORDER — LIDOCAINE 2% (20 MG/ML) 5 ML SYRINGE
INTRAMUSCULAR | Status: DC | PRN
Start: 2023-09-07 — End: 2023-09-07
  Administered 2023-09-07: 3 mL via INTRAVENOUS

## 2023-09-07 MED ORDER — DONEPEZIL HCL 5 MG PO TABS
5.0000 mg | ORAL_TABLET | Freq: Every day | ORAL | Status: DC
  Administered 2023-09-07: 5 mg via ORAL
  Filled 2023-09-07: qty 1

## 2023-09-07 MED ORDER — ONDANSETRON HCL 4 MG PO TABS: 4.0000 mg | ORAL_TABLET | Freq: Four times a day (QID) | ORAL | Status: DC | PRN

## 2023-09-07 MED ORDER — HYDROMORPHONE HCL 1 MG/ML IJ SOLN
INTRAMUSCULAR | Status: AC
  Filled 2023-09-07: qty 0.5

## 2023-09-07 MED ORDER — PANTOPRAZOLE SODIUM 40 MG PO TBEC
40.0000 mg | DELAYED_RELEASE_TABLET | Freq: Every day | ORAL | Status: DC
  Administered 2023-09-08: 40 mg via ORAL
  Filled 2023-09-07: qty 1

## 2023-09-07 MED ORDER — SODIUM CHLORIDE 0.9% IV SOLUTION: Freq: Once | INTRAVENOUS | Status: DC

## 2023-09-07 MED ORDER — MENTHOL 3 MG MT LOZG
1.0000 | LOZENGE | OROMUCOSAL | Status: DC | PRN
  Filled 2023-09-07: qty 9

## 2023-09-07 MED ORDER — 0.9 % SODIUM CHLORIDE (POUR BTL) OPTIME
TOPICAL | Status: DC | PRN
  Administered 2023-09-07: 1000 mL

## 2023-09-07 MED ORDER — EMPAGLIFLOZIN 10 MG PO TABS
10.0000 mg | ORAL_TABLET | Freq: Every day | ORAL | Status: DC
  Administered 2023-09-07 – 2023-09-08 (×2): 10 mg via ORAL
  Filled 2023-09-07 (×2): qty 1

## 2023-09-07 MED ORDER — CEFAZOLIN SODIUM-DEXTROSE 2-4 GM/100ML-% IV SOLN
2.0000 g | Freq: Three times a day (TID) | INTRAVENOUS | Status: AC
  Administered 2023-09-07 – 2023-09-08 (×2): 2 g via INTRAVENOUS
  Filled 2023-09-07 (×2): qty 100

## 2023-09-07 MED ORDER — ONDANSETRON HCL 4 MG/2ML IJ SOLN: 4.0000 mg | Freq: Four times a day (QID) | INTRAMUSCULAR | Status: DC | PRN

## 2023-09-07 MED ORDER — LACTULOSE 10 GM/15ML PO SOLN
30.0000 g | Freq: Three times a day (TID) | ORAL | Status: DC
  Administered 2023-09-07: 30 g via ORAL
  Filled 2023-09-07 (×3): qty 45

## 2023-09-07 MED ORDER — CEFAZOLIN SODIUM-DEXTROSE 2-4 GM/100ML-% IV SOLN
INTRAVENOUS | Status: AC
Start: 2023-09-07 — End: 2023-09-07
  Filled 2023-09-07: qty 100

## 2023-09-07 SURGICAL SUPPLY — 48 items
BAG COUNTER SPONGE SURGICOUNT (BAG) ×1 IMPLANT
BAND RUBBER #18 3X1/16 STRL (MISCELLANEOUS) ×2 IMPLANT
BENZOIN TINCTURE PRP APPL 2/3 (GAUZE/BANDAGES/DRESSINGS) ×1 IMPLANT
BLADE CLIPPER SURG (BLADE) IMPLANT
BLADE SURG 11 STRL SS (BLADE) ×1 IMPLANT
BRUSH SCRUB EZ PLAIN DRY (MISCELLANEOUS) IMPLANT
BUR CUTTER 7.0 ROUND (BURR) ×1 IMPLANT
BUR MATCHSTICK NEURO 3.0 LAGG (BURR) ×1 IMPLANT
CANISTER SUCT 3000ML PPV (MISCELLANEOUS) ×1 IMPLANT
DERMABOND ADVANCED .7 DNX12 (GAUZE/BANDAGES/DRESSINGS) ×1 IMPLANT
DRAPE HALF SHEET 40X57 (DRAPES) IMPLANT
DRAPE LAPAROTOMY 100X72X124 (DRAPES) ×1 IMPLANT
DRAPE MICROSCOPE SLANT 54X150 (MISCELLANEOUS) ×1 IMPLANT
DRAPE SURG 17X23 STRL (DRAPES) ×1 IMPLANT
DRSG OPSITE POSTOP 4X6 (GAUZE/BANDAGES/DRESSINGS) IMPLANT
DURAPREP 26ML APPLICATOR (WOUND CARE) ×1 IMPLANT
ELECTRODE REM PT RTRN 9FT ADLT (ELECTROSURGICAL) ×1 IMPLANT
GAUZE 4X4 16PLY ~~LOC~~+RFID DBL (SPONGE) IMPLANT
GAUZE SPONGE 4X4 12PLY STRL (GAUZE/BANDAGES/DRESSINGS) ×1 IMPLANT
GLOVE BIO SURGEON STRL SZ7 (GLOVE) IMPLANT
GLOVE BIO SURGEON STRL SZ8 (GLOVE) ×1 IMPLANT
GLOVE BIOGEL PI IND STRL 7.0 (GLOVE) IMPLANT
GLOVE EXAM NITRILE XL STR (GLOVE) IMPLANT
GLOVE INDICATOR 8.5 STRL (GLOVE) ×2 IMPLANT
GOWN STRL REUS W/ TWL LRG LVL3 (GOWN DISPOSABLE) ×1 IMPLANT
GOWN STRL REUS W/ TWL XL LVL3 (GOWN DISPOSABLE) ×2 IMPLANT
GOWN STRL REUS W/TWL 2XL LVL3 (GOWN DISPOSABLE) IMPLANT
KIT BASIN OR (CUSTOM PROCEDURE TRAY) ×1 IMPLANT
KIT TURNOVER KIT B (KITS) ×1 IMPLANT
NDL HYPO 22X1.5 SAFETY MO (MISCELLANEOUS) ×1 IMPLANT
NDL SPNL 22GX3.5 QUINCKE BK (NEEDLE) ×1 IMPLANT
NEEDLE HYPO 22X1.5 SAFETY MO (MISCELLANEOUS) ×1 IMPLANT
NEEDLE SPNL 22GX3.5 QUINCKE BK (NEEDLE) ×1 IMPLANT
NS IRRIG 1000ML POUR BTL (IV SOLUTION) ×1 IMPLANT
PACK LAMINECTOMY NEURO (CUSTOM PROCEDURE TRAY) ×1 IMPLANT
PATTIES SURGICAL .5 X.5 (GAUZE/BANDAGES/DRESSINGS) ×1 IMPLANT
PATTIES SURGICAL 1X1 (DISPOSABLE) ×1 IMPLANT
SPIKE FLUID TRANSFER (MISCELLANEOUS) ×1 IMPLANT
SPONGE SURGIFOAM ABS GEL SZ50 (HEMOSTASIS) ×1 IMPLANT
STRIP CLOSURE SKIN 1/2X4 (GAUZE/BANDAGES/DRESSINGS) ×1 IMPLANT
SUT VIC AB 0 CT1 18XCR BRD8 (SUTURE) ×1 IMPLANT
SUT VIC AB 2-0 CT1 18 (SUTURE) ×1 IMPLANT
SUT VICRYL 4-0 PS2 18IN ABS (SUTURE) ×1 IMPLANT
SYR 20ML LL LF (SYRINGE) IMPLANT
SYR 30ML SLIP (SYRINGE) ×1 IMPLANT
TOWEL GREEN STERILE (TOWEL DISPOSABLE) ×1 IMPLANT
TOWEL GREEN STERILE FF (TOWEL DISPOSABLE) ×1 IMPLANT
WATER STERILE IRR 1000ML POUR (IV SOLUTION) ×1 IMPLANT

## 2023-09-07 NOTE — Anesthesia Postprocedure Evaluation (Signed)
 Anesthesia Post Note  Patient: Jack Powell  Procedure(s) Performed: LEFT LUMBAR 4 - LUMBAR 5 MICRODISCECTOMY (Left: Back)     Patient location during evaluation: PACU Anesthesia Type: General Level of consciousness: awake and alert Pain management: pain level controlled Vital Signs Assessment: post-procedure vital signs reviewed and stable Respiratory status: spontaneous breathing, nonlabored ventilation and respiratory function stable Cardiovascular status: blood pressure returned to baseline and stable Postop Assessment: no apparent nausea or vomiting Anesthetic complications: no   No notable events documented.  Last Vitals:  Vitals:   09/07/23 1300 09/07/23 1323  BP: 132/65 135/69  Pulse: (!) 59 66  Resp: 14 16  Temp:    SpO2: 97% 100%    Last Pain:  Vitals:   09/07/23 1346  TempSrc:   PainSc: 7                  Erin Havers

## 2023-09-07 NOTE — Op Note (Signed)
 Preoperative diagnosis: Herniated nucleus pulposus L4-5 left.  Postoperative diagnosis: Same.  Procedure: Lumbar laminectomy microdiscectomy L4-5 in the left with microdissection of the left L5 nerve root and microscopic discectomy.  Surgeon: Gearl Keens.  Assistant: Beula Brunswick.  Anesthesia: General.  EBL: Minimal.  HPI: 73 year old gentleman progressive worsening back and left hip and leg pain rating down L5 nerve root pattern workup revealed herniated disc L4-5 on the left displacing the left L5 nerve root.  Due to patient's progression of clinical syndrome imaging findings failed conservative treatment I recommended limited to microdiscectomy at L4-5 on the left.  I extensively reviewed the risks and benefits of the operation with the patient as well as perioperative course expectations, and alternatives to surgery and he understood and agreed to proceed forward.Aaron Aas  Operative procedure: Patient was brought into the OR was [place] general esthesia positioned prone the Wilson frame his back was prepped and draped in routine sterile fashion.  Preoperative x-ray localized the appropriate level so after infiltration of 9 cc lidocaine  with epi a midline incision was made and Bovie cautery was used to take down the subcutaneous tissue and subperiosteal dissection was carried lamina of L4 and L5 on the left.  Interoperative x-ray confirmed defecation appropriate level.  So the inferior aspect of lamina for me facet complex superior aspect of lamina L5 still done with a high-speed drill laminotomy was begun with a 3 Miller Kerrison punch.  Ligamentum flavum was identified and removed in piecemeal fashion expose the thecal sac.  The operative microscope was draped and brought into the field and microscope illumination under by the medial facet of identification of the L5 pedicle margin superiorly identified the disc herniation was still partially contained with the ligamentum the L5 nerve root was dissected  off of the disc herniation annulotomy was made with lumbar scalpel several large free fragments disc removed from the disc base to space then cleaned out pituitary 0 steps and curettes.  Removed an extensive disc material centrally as well as some soft disc material that was superior to the disc base and the end discectomy was no further stenosis I was easily able to pass a coronary dilator cephalad caudally medially and laterally out the 4 and a 5 foramen.  The wounds were copiously irrigated take as hemostasis was maintained Gelfoam was over the top of the dura and the muscle and fascia approximate layers with Vicryl skin was closed running 4 subcuticular.  Dermabond benzoin Steri-Strips and a sterile dressing was applied patient to cover him in stable condition.  At the end the case all needle count sponge counts were correct.

## 2023-09-07 NOTE — H&P (Signed)
 Jack Powell is an 73 y.o. male.   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  I extensively reviewed the risks and benefits of the operation with the patient as well as perioperative course expectations of outcome and alternatives to surgery and he understood and agreed to proceed forward.  Past Medical History:  Diagnosis Date   Anemia    Cirrhosis (HCC)    Diabetes mellitus without complication (HCC)    Esophagitis    GERD (gastroesophageal reflux disease)    History of colon polyps    Hypertension    PONV (postoperative nausea and vomiting)    Skin cancer    Sleep apnea    Wilson disease     Past Surgical History:  Procedure Laterality Date   CATARACT EXTRACTION W/PHACO Right 02/27/2020   Procedure: CATARACT EXTRACTION PHACO AND INTRAOCULAR LENS PLACEMENT (IOC) RIGHT DIABETIC 3.78   00:55.4 6.8%;  Surgeon: Annell Kidney, MD;  Location: [facility];  Service: Ophthalmology;  Laterality: Right;  Diabetic - oral meds   CATARACT EXTRACTION W/PHACO Left 03/12/2020   Procedure: CATARACT EXTRACTION PHACO AND INTRAOCULAR LENS PLACEMENT (IOC) LEFT DIABETIC 1.54 00:29.3 5.2%;  Surgeon: Annell Kidney, MD;  Location: [facility];  Service: Ophthalmology;  Laterality: Left;  patient prefers arrival after 10am   COLONOSCOPY     COLONOSCOPY WITH PROPOFOL  N/A 08/18/2023   Procedure: COLONOSCOPY WITH PROPOFOL ;  Surgeon: Selena Daily, MD;  Location: [facility] ENDOSCOPY;  Service: Gastroenterology;  Laterality: N/A;   ESOPHAGEAL BANDING  01/13/2023   Procedure: ESOPHAGEAL  BANDING;  Surgeon: Selena Daily, MD;  Location: [facility] ENDOSCOPY;  Service: Gastroenterology;;   ESOPHAGEAL BANDING  02/24/2023   Procedure: ESOPHAGEAL BANDING;  Surgeon: Selena Daily, MD;  Location: [facility]COPY;  Service: Gastroenterology;;   ESOPHAGEAL BRUSHING  04/21/2023   Procedure: ESOPHAGEAL BRUSHING;  Surgeon: Selena Daily, MD;  Location: [facility] ENDOSCOPY;  Service: Gastroenterology;;   ESOPHAGOGASTRODUODENOSCOPY N/A 01/13/2023   Procedure: ESOPHAGOGASTRODUODENOSCOPY (EGD);  Surgeon: Selena Daily, MD;  Location: [facility] ENDOSCOPY;  Service: Gastroenterology;  Laterality: N/A;   ESOPHAGOGASTRODUODENOSCOPY (EGD) WITH PROPOFOL  N/A 05/05/2016   Procedure: ESOPHAGOGASTRODUODENOSCOPY (EGD) WITH PROPOFOL ;  Surgeon: Cassie Click, MD;  Location: [facility] ENDOSCOPY;  Service: Endoscopy;  Laterality: N/A;   ESOPHAGOGASTRODUODENOSCOPY (EGD) WITH PROPOFOL  N/A 02/24/2023   Procedure: ESOPHAGOGASTRODUODENOSCOPY (EGD) WITH PROPOFOL ;  Surgeon: Selena Daily, MD;  Location: [facility] ENDOSCOPY;  Service: Gastroenterology;  Laterality: N/A;   ESOPHAGOGASTRODUODENOSCOPY (EGD) WITH PROPOFOL  N/A 04/21/2023   Procedure: ESOPHAGOGASTRODUODENOSCOPY (EGD) WITH PROPOFOL ;  Surgeon: Selena Daily, MD;  Location: [facility] ENDOSCOPY;  Service: Gastroenterology;  Laterality: N/A;   ESOPHAGOGASTRODUODENOSCOPY (EGD) WITH PROPOFOL  N/A 08/18/2023   Procedure: ESOPHAGOGASTRODUODENOSCOPY (EGD) WITH PROPOFOL ;  Surgeon: Selena Daily, MD;  Location: [facility] ENDOSCOPY;  Service: Gastroenterology;  Laterality: N/A;   EYE SURGERY     HOT HEMOSTASIS  01/13/2023   Procedure: HOT HEMOSTASIS (ARGON PLASMA COAGULATION/BICAP);  Surgeon: Selena Daily, MD;  Location: [facility] ENDOSCOPY;  Service: Gastroenterology;;   HOT HEMOSTASIS  02/24/2023   Procedure: HOT HEMOSTASIS (ARGON PLASMA COAGULATION/BICAP);  Surgeon: Selena Daily, MD;  Location: [facility] ENDOSCOPY;  Service: Gastroenterology;;   Pleas Brill  BONE  MARROW BIOPSY & ASPIRATION  06/10/2023   IR PARACENTESIS  08/25/2023   LIVER BIOPSY     SKIN CANCER EXCISION     TONSILLECTOMY     UVULOPALATOPHARYNGOPLASTY  2005    Family History  Problem Relation Age of Onset   Diabetes Mother    Heart disease Mother    Cancer Father        colon   Heart attack Father    Social History:  reports that he has never smoked. He has never used smokeless tobacco. He reports that he does not drink alcohol  and does not use drugs.  Allergies:  Allergies  Allergen Reactions   Ace Inhibitors Cough   Serotonin Other (See Comments)    hallucinations   Shellfish Allergy Other (See Comments)    Nuts  Chocolate   Penicillins Rash    Medications Prior to Admission  Medication Sig Dispense Refill   carvedilol  (COREG ) 6.25 MG tablet Take 3.125 mg by mouth 2 (two) times daily with a meal.     cholecalciferol  (VITAMIN D ) 1000 units tablet Take 1,000 Units by mouth daily.     donepezil  (ARICEPT ) 10 MG tablet Take 5 mg by mouth at bedtime.     empagliflozin (JARDIANCE) 10 MG TABS tablet Take 5 mg by mouth 2 (two) times daily.     furosemide (LASIX) 20 MG tablet Take 40 mg by mouth 2 (two) times daily.     gabapentin  (NEURONTIN ) 100 MG capsule Take 100 mg by mouth 2 (two) times daily.     glipiZIDE (GLUCOTROL XL) 2.5 MG 24 hr tablet Take 2.5 mg by mouth daily with breakfast.     Glycerin-Hypromellose-PEG 400 (DRY EYE RELIEF DROPS) 0.2-0.2-1 % SOLN Place 1 drop into both eyes daily as needed (Dry eyes).     ketoconazole  (NIZORAL ) 2 % shampoo Apply 1 Application topically 2 (two) times a week.     lactulose , encephalopathy, (CHRONULAC ) 10 GM/15ML SOLN Take 45 mLs (30 g total) by mouth 3 (three) times daily. Goal to have 2-3 BM daily     lipase/protease/amylase (CREON ) 36000 UNITS CPEP capsule Take 2 capsules with the first bite of each meal and 1 capsule with the first bite of each snack 240 capsule 3   Magnesium  250 MG TABS Take 250 mg by mouth daily.      MULTIPLE VITAMIN PO Take 1 tablet by mouth daily.      ondansetron  (ZOFRAN -ODT) 4 MG disintegrating tablet Take 4 mg by mouth every 8 (eight) hours as needed for nausea.     pantoprazole  (PROTONIX ) 40 MG tablet Take 40 mg by mouth daily.     rifaximin  (XIFAXAN ) 550 MG TABS tablet Take 550 mg by mouth 2 (two) times daily.     spironolactone (ALDACTONE) 50 MG tablet Take 50 mg by mouth daily.     tamsulosin  (FLOMAX ) 0.4 MG CAPS capsule Take 0.4 mg by mouth daily.     Trientine  HCl (SYPRINE ) 250 MG CAPS Take 2 capsules (500 mg total) by mouth 2 (two) times daily.      Results for orders placed or performed during the hospital encounter of 09/07/23 (from the past 48 hours)  Glucose, capillary     Status: Abnormal   Collection Time: 09/07/23  8:10 AM  Result Value Ref Range   Glucose-Capillary 152 (H) 70 - 99 mg/dL    Comment: Glucose reference range applies only to samples taken after fasting for at least 8 hours.   No results  found.  Review of Systems  Musculoskeletal:  Positive for back pain.  Neurological:  Positive for weakness and numbness.    Blood pressure (!) 111/55, pulse 70, temperature 97.8 F (36.6 C), temperature source Oral, resp. rate 18, height 6' (1.829 m), weight 60.8 kg, SpO2 97%. Physical Exam HENT:     Head: Normocephalic.     Right Ear: Tympanic membrane normal.     Nose: Nose normal.     Mouth/Throat:     Mouth: Mucous membranes are moist.  Cardiovascular:     Rate and Rhythm: Normal rate.     Pulses: Normal pulses.  Pulmonary:     Effort: Pulmonary effort is normal.  Musculoskeletal:        General: Normal range of motion.  Neurological:     Mental Status: He is alert.     Comments: Anterior tibialis and EHL on the left 4 to 4+ out of 5 otherwise left lower extremity 5 out of 5 right lower extremity 5 out of 5 iliopsoas, quads, hamstrings, gastrocs, tibialis, EHL.      Assessment/Plan 73 year old presents for lumbar laminectomy microdiscectomy L4-5 on  the left  Ferris Hua, MD 09/07/2023, 9:24 AM

## 2023-09-07 NOTE — Anesthesia Procedure Notes (Signed)
 Procedure Name: Intubation Date/Time: 09/07/2023 10:48 AM  Performed by: Derral Flick, CRNAPre-anesthesia Checklist: Patient identified, Emergency Drugs available, Suction available and Patient being monitored Patient Re-evaluated:Patient Re-evaluated prior to induction Oxygen Delivery Method: Circle system utilized Preoxygenation: Pre-oxygenation with 100% oxygen Induction Type: IV induction Ventilation: Mask ventilation without difficulty Laryngoscope Size: Glidescope and 3 Grade View: Grade I Tube type: Oral Number of attempts: 1 Airway Equipment and Method: Stylet Placement Confirmation: ETT inserted through vocal cords under direct vision, positive ETCO2 and breath sounds checked- equal and bilateral Secured at: 22 cm Tube secured with: Tape Dental Injury: Teeth and Oropharynx as per pre-operative assessment

## 2023-09-07 NOTE — Progress Notes (Signed)
 Jardiance was ordered as 10 mg po BID.   Upon further research, an office visit note from January states the order as 10 mg po daily  All other notes located after this date state the same.  Pharmacy records support daily use  Changed Jardiance 10 mg po bid to 10 mg po daily  Talon Witting BS, PharmD, BCPS Clinical Pharmacist 09/07/2023 2:15 PM  Contact: [phone] after 3 PM  "Be curious, not judgmental..." -Rumalda Counter

## 2023-09-07 NOTE — Transfer of Care (Signed)
 Immediate Anesthesia Transfer of Care Note  Patient: Jack Powell  Procedure(s) Performed: LEFT LUMBAR 4 - LUMBAR 5 MICRODISCECTOMY (Left: Back)  Patient Location: PACU  Anesthesia Type:General  Level of Consciousness: awake  Airway & Oxygen Therapy: Patient Spontanous Breathing and Patient connected to nasal cannula oxygen  Post-op Assessment: Report given to RN and Post -op Vital signs reviewed and stable  Post vital signs: Reviewed and stable  Last Vitals:  Vitals Value Taken Time  BP 136/67 09/07/23 1215  Temp    Pulse 61 09/07/23 1220  Resp 14 09/07/23 1220  SpO2 98 % 09/07/23 1220  Vitals shown include unfiled device data.  Last Pain:  Vitals:   09/07/23 1030  TempSrc: Oral  PainSc:          Complications: No notable events documented.

## 2023-09-08 ENCOUNTER — Encounter (HOSPITAL_COMMUNITY): Payer: Self-pay | Admitting: Neurosurgery

## 2023-09-08 DIAGNOSIS — M5126 Other intervertebral disc displacement, lumbar region: Secondary | ICD-10-CM | POA: Diagnosis not present

## 2023-09-08 LAB — BPAM PLATELET PHERESIS
Blood Product Expiration Date: 202505022359
ISSUE DATE / TIME: 202504301010
Unit Type and Rh: 7300

## 2023-09-08 LAB — GLUCOSE, CAPILLARY: Glucose-Capillary: 286 mg/dL — ABNORMAL HIGH (ref 70–99)

## 2023-09-08 LAB — PREPARE PLATELET PHERESIS: Unit division: 0

## 2023-09-08 MED ORDER — HYDROCODONE-ACETAMINOPHEN 5-325 MG PO TABS: 1.0000 | ORAL_TABLET | Freq: Four times a day (QID) | ORAL | 0 refills | Status: DC | PRN

## 2023-09-08 MED ORDER — CYCLOBENZAPRINE HCL 10 MG PO TABS: 10.0000 mg | ORAL_TABLET | Freq: Three times a day (TID) | ORAL | 0 refills | Status: DC | PRN

## 2023-09-08 NOTE — Progress Notes (Signed)
 Patient alert and oriented, void, ambulate. Surgical site clean and dry no sign of infection. D/c instruction explain and given to the patient and wife, all questions answered.

## 2023-09-08 NOTE — Discharge Summary (Signed)
 Physician Discharge Summary  Patient ID: Jack Powell MRN: [id number] DOB/AGE: 71-Jan-1952 71 y.o.  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    Discharge Diagnoses: same   Discharged Condition: good  Hospital Course: The patient was admitted on 09/07/2023 and taken to the operating room where the patient underwent Left L4-5 microdiskectomy. The patient tolerated the procedure well and was taken to the recovery room and then to the floor in stable condition. The hospital course was routine. There were no complications. The wound remained clean dry and intact. Pt had appropriate back soreness. No complaints of leg pain or new N/T/W. The patient remained afebrile with stable vital signs, and tolerated a regular diet. The patient continued to increase activities, and pain was well controlled with oral pain medications.   Consults: None  Significant Diagnostic Studies:  Results for orders placed or performed during the hospital encounter of 09/07/23  Glucose, capillary   Collection Time: 09/07/23  8:10 AM  Result Value Ref Range   Glucose-Capillary 152 (H) 70 - 99 mg/dL  CBC   Collection Time: 09/07/23  8:59 AM  Result Value Ref Range   WBC 3.7 (L) 4.0 - 10.5 K/uL   RBC 3.09 (L) 4.22 - 5.81 MIL/uL   Hemoglobin 10.6 (L) 13.0 - 17.0 g/dL   HCT 46.9 (L) 62.9 - 52.8 %   MCV 102.6 (H) 80.0 - 100.0 fL   MCH 34.3 (H) 26.0 - 34.0 pg   MCHC 33.4 30.0 - 36.0 g/dL   RDW 41.3 (H) 24.4 - 01.0 %   Platelets 72 (L) 150 - 400 K/uL   nRBC 0.0 0.0 - 0.2 %  Surgical pcr screen   Collection Time: 09/07/23  9:22 AM   Specimen: Nasal Mucosa; Nasal Swab  Result Value Ref Range   MRSA, PCR NEGATIVE NEGATIVE   Staphylococcus aureus NEGATIVE NEGATIVE  Prepare platelet pheresis   Collection Time: 09/07/23  9:54 AM  Result Value Ref Range   Unit Number [id number]    Blood Component Type PLTP2 PSORALEN TREATED    Unit  division 00    Status of Unit ISSUED    Transfusion Status      OK TO TRANSFUSE Performed at [facility], [address] [place], [place] [address]   BPAM Platelet Pheresis   Collection Time: 09/07/23  9:54 AM  Result Value Ref Range   ISSUE DATE / TIME [id number]    Blood Product Unit Number [id number]    PRODUCT CODE [id number]    Unit Type and Rh 7300    Blood Product Expiration Date [id number]   Glucose, capillary   Collection Time: 09/07/23 10:07 AM  Result Value Ref Range   Glucose-Capillary 150 (H) 70 - 99 mg/dL  Glucose, capillary   Collection Time: 09/07/23 12:17 PM  Result Value Ref Range   Glucose-Capillary 130 (H) 70 - 99 mg/dL   Comment 1 Notify RN   Glucose, capillary   Collection Time: 09/07/23  5:13 PM  Result Value Ref Range   Glucose-Capillary 305 (H) 70 - 99 mg/dL  Glucose, capillary   Collection Time: 09/07/23  9:13 PM  Result Value Ref Range   Glucose-Capillary 286 (H) 70 - 99 mg/dL   Comment 1 Notify RN    Comment 2 Document in Chart   Glucose, capillary   Collection Time: 09/08/23  6:11 AM  Result Value Ref Range   Glucose-Capillary 286 (H) 70 - 99 mg/dL  Comment 1 Notify RN    Comment 2 Document in Chart     DG Lumbar Spine 2-3 Views Result Date: 09/07/2023 CLINICAL DATA:  Elective surgery. Intraoperative imaging for localization. EXAM: LUMBAR SPINE - 2-3 VIEW COMPARISON:  MRI lumbar spine 07/29/2023 FINDINGS: Utilizing the numbering nomenclature in the 07/29/2023 MRI report, on the first image, a needle points towards the posterior aspect of the L4 spinous process. On the second image there is instrumentation directed towards the posterior L4-5 disc level. Moderate L5-S1 disc space narrowing, endplate sclerosis, and peripheral osteophytosis. IMPRESSION: Intraoperative imaging for localization. Electronically Signed   By: Bertina Broccoli M.D.   On: 09/07/2023 13:30   IR Paracentesis Result Date: 08/25/2023 INDICATION: Abdominal  distention. Ascites. Request for diagnostic and therapeutic paracentesis. EXAM: ULTRASOUND GUIDED RIGHT LOWER QUADRANT PARACENTESIS MEDICATIONS: 1% plain lidocaine , 5 mL COMPLICATIONS: None immediate. PROCEDURE: Informed written consent was obtained from the patient after a discussion of the risks, benefits and alternatives to treatment. A timeout was performed prior to the initiation of the procedure. Initial ultrasound scanning demonstrates a large amount of ascites within the right lower abdominal quadrant. The right lower abdomen was prepped and draped in the usual sterile fashion. 1% lidocaine  was used for local anesthesia. Following this, a 19 gauge, 7-cm, Yueh catheter was introduced. An ultrasound image was saved for documentation purposes. The paracentesis was performed. The catheter was removed and a dressing was applied. The patient tolerated the procedure well without immediate post procedural complication. FINDINGS: A total of approximately 3.7 L of hazy, dark yellow fluid was removed. Samples were sent to the laboratory as requested by the clinical team. IMPRESSION: Successful ultrasound-guided paracentesis yielding 3.7 liters of peritoneal fluid. Procedure performed by Prudence Brown PA-C and supervised by Dr. Erica Hau Electronically Signed   By: Erica Hau M.D.   On: 08/25/2023 12:45   CT HEAD WO CONTRAST ( ) Result Date: 08/24/2023 CLINICAL DATA:  Altered mental status.  No history of trauma. EXAM: CT HEAD WITHOUT CONTRAST TECHNIQUE: Contiguous axial images were obtained from the base of the skull through the vertex without intravenous contrast. RADIATION DOSE REDUCTION: This exam was performed according to the departmental dose-optimization program which includes automated exposure control, adjustment of the mA and/or kV according to patient size and/or use of iterative reconstruction technique. COMPARISON:  10/24/2017 FINDINGS: Brain: Mild age related volume loss. No subjective  lobar predominance. No sign of acute infarction, mass lesion, hemorrhage, hydrocephalus or extra-axial collection. Vascular: No abnormal vascular finding. Skull: Negative Sinuses/Orbits: Clear/normal Other: None IMPRESSION: No acute or reversible finding. Mild age related volume loss. Electronically Signed   By: Bettylou Brunner M.D.   On: 08/24/2023 16:21   EEG adult Result Date: 08/24/2023 Arleene Lack, MD     08/25/2023  8:16 AM Patient Name: ILLYA HANGARTNER MRN: [id number] Epilepsy Attending: Arleene Lack Referring Physician/Provider: Lavanda Porter, MD Date: 08/24/2023 Duration: 24.08 mins Patient history: 72yo m with ams. EEG to evaluate for seizure Level of alertness: Awake AEDs during EEG study: GBP Technical aspects: This EEG study was done with scalp electrodes positioned according to the 10-20 International system of electrode placement. Electrical activity was reviewed with band pass filter of 1-70Hz , sensitivity of 7 uV/mm, display speed of 91mm/sec with a 60Hz  notched filter applied as appropriate. EEG data were recorded continuously and digitally stored.  Video monitoring was available and reviewed as appropriate. Description: EEG showed continuous generalized polymorphic 3 to 5 Hz theta-delta slowing, at times with triphasic  morphology. Hyperventilation and photic stimulation were not performed.   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 No seizures or definite epileptiform discharges were seen throughout the recording. Priyanka O Yadav    Antibiotics:  Anti-infectives (From admission, onward)    Start     Dose/Rate Route Frequency Ordered Stop   09/07/23 2200  rifaximin  (XIFAXAN ) tablet 550 mg        550 mg Oral 2 times daily 09/07/23 1326     09/07/23 1900  ceFAZolin  (ANCEF ) IVPB 2g/100 mL premix        2 g 200 mL/hr over 30 Minutes Intravenous Every 8 hours  09/07/23 1326 09/08/23 0223   09/07/23 0930  ceFAZolin  (ANCEF ) IVPB 2g/100 mL premix        2 g 200 mL/hr over 30 Minutes Intravenous  Once 09/07/23 0928 09/07/23 1106   09/07/23 0925  ceFAZolin  (ANCEF ) 2-4 GM/100ML-% IVPB       Note to Pharmacy: Gleason, Ginger E: cabinet override      09/07/23 0925 09/07/23 1103       Discharge Exam: Blood pressure (!) 125/52, pulse 68, temperature 98.2 F (36.8 C), temperature source Oral, resp. rate 18, height 6' (1.829 m), weight 60.8 kg, SpO2 99%. Neurologic: Grossly normal Ambulating and voiding well incision cdi   Discharge Medications:   Allergies as of 09/08/2023       Reactions   Ace Inhibitors Cough   Serotonin Other (See Comments)   hallucinations   Shellfish Allergy Other (See Comments)   Nuts  Chocolate   Penicillins Rash        Medication List     TAKE these medications    carvedilol  6.25 MG tablet Commonly known as: COREG  Take 3.125 mg by mouth 2 (two) times daily with a meal.   cholecalciferol  1000 units tablet Commonly known as: VITAMIN D  Take 1,000 Units by mouth daily.   cyclobenzaprine  10 MG tablet Commonly known as: FLEXERIL  Take 1 tablet (10 mg total) by mouth 3 (three) times daily as needed for muscle spasms.   donepezil  10 MG tablet Commonly known as: ARICEPT  Take 5 mg by mouth at bedtime.   Dry Eye Relief Drops 0.2-0.2-1 % Soln Generic drug: Glycerin -Hypromellose-PEG 400 Place 1 drop into both eyes daily as needed (Dry eyes).   empagliflozin  10 MG Tabs tablet Commonly known as: JARDIANCE  Take 5 mg by mouth 2 (two) times daily.   furosemide  20 MG tablet Commonly known as: LASIX  Take 40 mg by mouth 2 (two) times daily.   gabapentin  100 MG capsule Commonly known as: NEURONTIN  Take 100 mg by mouth 2 (two) times daily.   glipiZIDE  2.5 MG 24 hr tablet Commonly known as: GLUCOTROL  XL Take 2.5 mg by mouth daily with breakfast.   HYDROcodone -acetaminophen  5-325 MG tablet Commonly known as:  NORCO/VICODIN Take 1-2 tablets by mouth every 6 (six) hours as needed for severe pain (pain score 7-10) or moderate pain (pain score 4-6) (1 tablet for moderate pain, t tablets for severe pain).   ketoconazole  2 % shampoo Commonly known as: NIZORAL  Apply 1 Application topically 2 (two) times a week.   lactulose  (encephalopathy) 10 GM/15ML Soln Commonly known as: CHRONULAC  Take 45 mLs (30 g total) by mouth 3 (three) times daily. Goal to have 2-3 BM daily   lipase/protease/amylase 91478 UNITS Cpep capsule Commonly known as: Creon  Take 2 capsules with the first bite of each meal and 1 capsule  with the first bite of each snack   Magnesium  250 MG Tabs Take 250 mg by mouth daily.   MULTIPLE VITAMIN PO Take 1 tablet by mouth daily.   ondansetron  4 MG disintegrating tablet Commonly known as: ZOFRAN -ODT Take 4 mg by mouth every 8 (eight) hours as needed for nausea.   pantoprazole  40 MG tablet Commonly known as: PROTONIX  Take 40 mg by mouth daily.   spironolactone  50 MG tablet Commonly known as: ALDACTONE  Take 50 mg by mouth daily.   tamsulosin  0.4 MG Caps capsule Commonly known as: FLOMAX  Take 0.4 mg by mouth daily.   Trientine  HCl 250 MG Caps Commonly known as: Syprine  Take 2 capsules (500 mg total) by mouth 2 (two) times daily.   Xifaxan  550 MG Tabs tablet Generic drug: rifaximin  Take 550 mg by mouth 2 (two) times daily.        Disposition: home   Final Dx: left L4-5 microdiskectomy  Discharge Instructions      Remove dressing in 72 hours   Complete by: As directed    Call MD for:  difficulty breathing, headache or visual disturbances   Complete by: As directed    Call MD for:  hives   Complete by: As directed    Call MD for:  persistant dizziness or light-headedness   Complete by: As directed    Call MD for:  persistant nausea and vomiting   Complete by: As directed    Call MD for:  redness, tenderness, or signs of infection (pain, swelling, redness, odor or  green/yellow discharge around incision site)   Complete by: As directed    Call MD for:  severe uncontrolled pain   Complete by: As directed    Call MD for:  temperature >100.4   Complete by: As directed    Diet - low sodium heart healthy   Complete by: As directed    Driving Restrictions   Complete by: As directed    No driving for 2 weeks, no riding in the car for 1 week   Increase activity slowly   Complete by: As directed    Lifting restrictions   Complete by: As directed    No lifting more than 8 lbs          Signed: Kenard Paul Cotina Freedman 09/08/2023, 7:40 AM

## 2023-09-08 NOTE — Plan of Care (Signed)
  Problem: Education: Goal: Knowledge of General Education information will improve Description: Including pain rating scale, medication(s)/side effects and non-pharmacologic comfort measures Outcome: Completed/Met   Problem: Health Behavior/Discharge Planning: Goal: Ability to manage health-related needs will improve Outcome: Completed/Met   Problem: Clinical Measurements: Goal: Ability to maintain clinical measurements within normal limits will improve Outcome: Completed/Met Goal: Will remain free from infection Outcome: Completed/Met Goal: Diagnostic test results will improve Outcome: Completed/Met Goal: Respiratory complications will improve Outcome: Completed/Met Goal: Cardiovascular complication will be avoided Outcome: Completed/Met   Problem: Activity: Goal: Risk for activity intolerance will decrease Outcome: Completed/Met   Problem: Nutrition: Goal: Adequate nutrition will be maintained Outcome: Completed/Met   Problem: Skin Integrity: Goal: Risk for impaired skin integrity will decrease Outcome: Completed/Met   Problem: Pain Managment: Goal: General experience of comfort will improve and/or be controlled Outcome: Completed/Met   Problem: Skin Integrity: Goal: Risk for impaired skin integrity will decrease Outcome: Completed/Met

## 2023-09-08 NOTE — Evaluation (Signed)
 Occupational Therapy Evaluation and Discharge Patient Details Name: Jack Powell MRN: [id number] DOB: [birth year] Today's Date: 09/08/2023   History of Present Illness   Pt is a 73 yo male d/p lumbar laminectomy microdiscectomy L4-5 in the left with microdissection of the left L5 nerve root and microscopic discectomy.   PHMx:anemia, HTN, DM2, Wilson's disease     Clinical Impressions This 73 yo male admitted and underwent above presents to acute OT with all education completed and post op handout provided and reviewed. No further OT needs, we will D/C from acute OT.     If plan is discharge home, recommend the following:   A little help with bathing/dressing/bathroom;Assistance with cooking/housework;Assist for transportation     Functional Status Assessment   Patient has had a recent decline in their functional status and demonstrates the ability to make significant improvements in function in a reasonable and predictable amount of time. (without further need for skilled OT, all education completed)     Equipment Recommendations   None recommended by OT      Precautions/Restrictions   Precautions Precautions: Fall Recall of Precautions/Restrictions: Intact Precaution/Restrictions Comments: Pt has AFO for L foot drop Restrictions Weight Bearing Restrictions Per Provider Order: No     Mobility Bed Mobility Overal bed mobility: Needs Assistance Bed Mobility: Rolling, Sidelying to Sit Rolling: Contact guard assist Sidelying to sit: Contact guard assist            Transfers Overall transfer level: Needs assistance Equipment used: Rolling walker (2 wheels) Transfers: Sit to/from Stand Sit to Stand: Supervision                  Balance Overall balance assessment: Mild deficits observed, not formally tested                                         ADL either performed or assessed with clinical judgement   ADL                                          General ADL Comments: Educated on dressing, use of 2 cups for brushing teeth, use of pillows for positioning in bed, building up sitting tolerance, use of wet wipes for back peri care, bed  mobility     Vision Baseline Vision/History: 1 Wears glasses Ability to See in Adequate Light: 0 Adequate Patient Visual Report: No change from baseline              Pertinent Vitals/Pain Pain Assessment Pain Assessment: Faces Faces Pain Scale: Hurts a little bit Pain Location: incisional Pain Descriptors / Indicators: Sore Pain Intervention(s): Limited activity within patient's tolerance, Monitored during session, Repositioned     Extremity/Trunk Assessment Upper Extremity Assessment Upper Extremity Assessment: Overall WFL for tasks assessed           Communication Communication Communication: No apparent difficulties   Cognition Arousal: Alert   Cognition: Cognition impaired                                       Cueing    Cueing Techniques: Verbal cues              Home Living Family/patient expects  to be discharged to:: Private residence Living Arrangements: Spouse/significant other Available Help at Discharge: Family;Available 24 hours/day Type of Home: House Home Access: Level entry     Home Layout: One level     Bathroom Shower/Tub: Producer, television/film/video: Handicapped height   How Accessible: Accessible via walker Home Equipment: Grab bars - tub/shower;Rolling Walker (2 wheels);Cane - single point;Shower seat - built in;Hand held shower head;Toilet riser          Prior Functioning/Environment Prior Level of Function : Independent/Modified Independent             Mobility Comments: RW  some depending on pain      OT Problem List: Decreased range of motion;Pain        OT Goals(Current goals can be found in the care plan section)   Acute Rehab OT Goals Patient Stated  Goal: home today      AM-PAC OT "6 Clicks" Daily Activity     Outcome Measure Help from another person eating meals?: None Help from another person taking care of personal grooming?: None Help from another person toileting, which includes using toliet, bedpan, or urinal?: None Help from another person bathing (including washing, rinsing, drying)?: None Help from another person to put on and taking off regular upper body clothing?: None Help from another person to put on and taking off regular lower body clothing?: A Little 6 Click Score: 23   End of Session Equipment Utilized During Treatment: Rolling walker (2 wheels) Nurse Communication:  (pt good to go from OT standpoint)  Activity Tolerance: Patient tolerated treatment well Patient left: in chair;with call bell/phone within reach;with family/visitor present  OT Visit Diagnosis: Unsteadiness on feet (R26.81);Pain Pain - part of body:  (incisional)                Time: 1610-9604 OT Time Calculation (min): 30 min Charges:  OT General Charges $OT Visit: 1 Visit OT Evaluation $OT Eval Moderate Complexity: 1 Mod OT Treatments $Self Care/Home Management : 8-22 mins  Merryl Abraham OT Acute Rehabilitation Services Office [phone]    Lenox Raider 09/08/2023, 10:22 AM

## 2023-09-08 NOTE — Evaluation (Signed)
 Physical Therapy Evaluation  Patient Details Name: Jack Powell MRN: [id number] DOB: 04/09/1951 Today's Date: 09/08/2023  History of Present Illness  Pt is a 73 yo male who presents s/p lumbar laminectomy/microdiscectomy L4-5 in the left with microdissection of the left L5 nerve root and microscopic discectomy on 09/07/2023. PMH significant for anemia, HTN, DM2, Wilson's disease.   Clinical Impression  Pt admitted with above diagnosis. At the time of PT eval, pt was able to demonstrate transfers and ambulation with gross CGA to supervision for safety and RW for support. Pt was educated on precautions, positioning recommendations, appropriate activity progression, and car transfer. Pt currently with functional limitations due to the deficits listed below (see PT Problem List). Pt will benefit from skilled PT to increase their independence and safety with mobility to allow discharge to the venue listed below.          If plan is discharge home, recommend the following: Help with stairs or ramp for entrance;Assist for transportation;A lot of help with walking and/or transfers   Can travel by private vehicle        Equipment Recommendations    Recommendations for Other Services       Functional Status Assessment Patient has had a recent decline in their functional status and demonstrates the ability to make significant improvements in function in a reasonable and predictable amount of time.     Precautions / Restrictions Precautions Precautions: Fall Recall of Precautions/Restrictions: Intact Precaution/Restrictions Comments: Pt has AFO for L foot drop Restrictions Weight Bearing Restrictions Per Provider Order: No      Mobility  Bed Mobility Overal bed mobility: Needs Assistance Bed Mobility: Rolling, Sidelying to Sit, Sit to Sidelying Rolling: Supervision Sidelying to sit: Supervision   Sit to supine: Supervision   General bed mobility comments: VC's throughout for  log roll technique. HOB flat, rails lowered, and pt transferring to the R side as he would do at home.    Transfers Overall transfer level: Needs assistance Equipment used: Rolling walker (2 wheels) Transfers: Sit to/from Stand Sit to Stand: Supervision   Step pivot transfers: Contact guard assist       General transfer comment: VC's for wide BOS and hand placement on seated surface for safety as pt powered up to full stand. Wide BOS allowed pt to to control lower to sitting.    Ambulation/Gait Ambulation/Gait assistance: Contact guard assist Gait Distance (Feet): 250 Feet Assistive device: Rolling walker (2 wheels) Gait Pattern/deviations: Step-through pattern, Decreased stride length, Trunk flexed Gait velocity: Decreased     General Gait Details: VC's throughout for improved posture, closer walker proximity and forward gaze. Pt moving slow but generally steady with AFO donned and RW use.  Stairs            Wheelchair Mobility     Tilt Bed    Modified Rankin (Stroke Patients Only)       Balance Overall balance assessment: Mild deficits observed, not formally tested Sitting-balance support: No upper extremity supported, Feet supported Sitting balance-Leahy Scale: Good     Standing balance support: Bilateral upper extremity supported, During functional activity, Reliant on assistive device for balance Standing balance-Leahy Scale: Poor Standing balance comment: Reliant on RW                             Pertinent Vitals/Pain Pain Assessment Pain Assessment: Faces Faces Pain Scale: Hurts a little bit Pain Location: incisional Pain Descriptors / Indicators:  Sore Pain Intervention(s): Limited activity within patient's tolerance, Monitored during session, Repositioned    Home Living Family/patient expects to be discharged to:: Private residence Living Arrangements: Spouse/significant other Available Help at Discharge: Family;Available 24  hours/day Type of Home: House Home Access: Level entry       Home Layout: One level Home Equipment: Grab bars - tub/shower;Rolling Walker (2 wheels);Cane - single point;Shower seat - built in;Hand held shower head;Toilet riser      Prior Function Prior Level of Function : Independent/Modified Independent             Mobility Comments: RW  some depending on pain but mostly without and using furniture and walls for support around the house.       Extremity/Trunk Assessment   Upper Extremity Assessment Upper Extremity Assessment: Defer to OT evaluation    Lower Extremity Assessment Lower Extremity Assessment: LLE deficits/detail LLE Deficits / Details: Pt with 4-/5 strength in ankle DF. Has AFO present.    Cervical / Trunk Assessment Cervical / Trunk Assessment: Back Surgery  Communication   Communication Communication: No apparent difficulties    Cognition Arousal: Alert Behavior During Therapy: WFL for tasks assessed/performed   PT - Cognitive impairments: No apparent impairments                         Following commands: Impaired Following commands impaired: Follows one step commands with increased time     Cueing Cueing Techniques: Verbal cues     General Comments      Exercises     Assessment/Plan    PT Assessment Patient needs continued PT services  PT Problem List Decreased knowledge of use of DME;Decreased coordination;Decreased mobility;Decreased balance;Decreased activity tolerance;Decreased range of motion;Decreased strength;Pain       PT Treatment Interventions DME instruction;Gait training;Stair training;Functional mobility training;Therapeutic activities;Therapeutic exercise;Balance training;Neuromuscular re-education;Patient/family education    PT Goals (Current goals can be found in the Care Plan section)  Acute Rehab PT Goals Patient Stated Goal: Home today PT Goal Formulation: With patient Time For Goal Achievement:  09/15/23 Potential to Achieve Goals: Good    Frequency Min 5X/week     Co-evaluation               AM-PAC PT "6 Clicks" Mobility  Outcome Measure Help needed turning from your back to your side while in a flat bed without using bedrails?: A Little Help needed moving from lying on your back to sitting on the side of a flat bed without using bedrails?: A Little Help needed moving to and from a bed to a chair (including a wheelchair)?: A Little Help needed standing up from a chair using your arms (e.g., wheelchair or bedside chair)?: A Little Help needed to walk in hospital room?: A Little Help needed climbing 3-5 steps with a railing? : A Little 6 Click Score: 18    End of Session Equipment Utilized During Treatment: Gait belt;Other (comment) (L LE AFO) Activity Tolerance: Patient tolerated treatment well Patient left: in bed;with call bell/phone within reach;with family/visitor present Nurse Communication: Mobility status PT Visit Diagnosis: Other abnormalities of gait and mobility (R26.89);Muscle weakness (generalized) (M62.81);Difficulty in walking, not elsewhere classified (R26.2);Other symptoms and signs involving the nervous system (R29.898);Pain Pain - part of body:  (back)    Time: 1914-7829 PT Time Calculation (min) (ACUTE ONLY): 28 min   Charges:   PT Evaluation $PT Eval Low Complexity: 1 Low PT Treatments $Gait Training: 8-22 mins PT General Charges $$  ACUTE PT VISIT: 1 Visit         Simone Dubois, PT, DPT Acute Rehabilitation Services Secure Chat Preferred Office: [phone]   Jack Powell 09/08/2023, 11:38 AM

## 2023-09-13 ENCOUNTER — Telehealth: Payer: Self-pay | Admitting: Gastroenterology

## 2023-09-13 NOTE — Telephone Encounter (Signed)
 He does not need to take Creon  as long as he is not having diarrhea For constipation, he should continue taking lactulose  1-2 doses daily His weight loss is most likely secondary to muscle wasting from his underlying cirrhosis and also not being able to move around much Recommend trial of Zyprexa 2.5 mg at bedtime to see if it helps improve his appetite Encourage to have protein snacks in between meals  RV

## 2023-09-13 NOTE — Telephone Encounter (Signed)
 The patient called to speak with Jack Powell regarding his medication, as he is unsure if he is taking it correctly.

## 2023-09-13 NOTE — Telephone Encounter (Signed)
 Patient has some questions about the Creon . He states his main concern is weight loss. He states he is down to 115 pounds. He states he is taking the creon  2 capsules with the first bite of breakfast and supper. He does not eat lunch. He forgets to take 1 capsule if she eats a snack. He states is is not having no nausea, vomiting or diarrhea. He has had constipation once but took two doses of lactulose  and had a hard bowel movement. He states since then he has had normal bowel movements daily. He states he had surgery last week on his lumbar. He states he has been on Oxycodone  since surgery. He is also taking Spironolactone  50mg , Furosemide  20mg  2 pills daily, and Glipizide  2.5mg . He states his appetite has decrease but he is urinating more.

## 2023-09-14 ENCOUNTER — Telehealth: Payer: Self-pay | Admitting: Gastroenterology

## 2023-09-14 ENCOUNTER — Telehealth: Payer: Self-pay

## 2023-09-14 DIAGNOSIS — R634 Abnormal weight loss: Secondary | ICD-10-CM

## 2023-09-14 MED ORDER — OLANZAPINE 2.5 MG PO TABS: 2.5000 mg | ORAL_TABLET | Freq: Every day | ORAL | 0 refills | Status: DC

## 2023-09-14 NOTE — Telephone Encounter (Signed)
 Patient verbalized understanding and states he will do this

## 2023-09-14 NOTE — Addendum Note (Signed)
 Addended by: Conny Del L on: 09/14/2023 09:12 AM   Modules accepted: Orders

## 2023-09-14 NOTE — Telephone Encounter (Signed)
 Submitted PA through cover my meds for patient medication Zyprexa. Waiting on response from insurance company

## 2023-09-14 NOTE — Telephone Encounter (Signed)
 Pt requesting call back to discuss medication denial

## 2023-09-14 NOTE — Telephone Encounter (Signed)
 Patient verbalized understanding of instructions. He states he is okay with trying the medications. He states to send it to his pharmacy. Sent medication to the pharmacy. Patient and wife states for breakfast he is trying to eat things with protein in it or add protein to pancakes when his wife makes them.

## 2023-09-14 NOTE — Telephone Encounter (Signed)
 The medication has been denied for patient a appeal can be done for patient by letter and faxed expedited to [phone]. Patient Name and member number need to be included on the letter or cover form.

## 2023-09-15 NOTE — Telephone Encounter (Signed)
 Patient states he talk to Dr. Annabell Key and he has put him on Megestrol

## 2023-09-16 ENCOUNTER — Telehealth: Payer: Self-pay

## 2023-09-16 NOTE — Telephone Encounter (Signed)
 The rep from [employer] called in to get dx codes.

## 2023-09-16 NOTE — Telephone Encounter (Signed)
 [employer] wanted to confirm Dx for the Zyprexa  Rx... Went over notes with representative Robitchia and she will submit the additional information to see if medication will be approved

## 2023-09-21 ENCOUNTER — Ambulatory Visit
Admission: RE | Admit: 2023-09-21 | Discharge: 2023-09-21 | Disposition: A | Discharge status: Home / Self Care | Source: Ambulatory Visit | Attending: Internal Medicine | Admitting: Internal Medicine

## 2023-09-21 ENCOUNTER — Other Ambulatory Visit: Payer: Self-pay | Admitting: Internal Medicine

## 2023-09-21 DIAGNOSIS — R634 Abnormal weight loss: Secondary | ICD-10-CM

## 2023-09-21 DIAGNOSIS — R1084 Generalized abdominal pain: Secondary | ICD-10-CM

## 2023-09-21 MED ORDER — IOHEXOL 300 MG/ML  SOLN
100.0000 mL | Freq: Once | INTRAMUSCULAR | Status: AC | PRN
  Administered 2023-09-21: 100 mL via INTRAVENOUS

## 2023-10-10 ENCOUNTER — Encounter: Payer: Self-pay | Admitting: Oncology

## 2023-10-25 ENCOUNTER — Other Ambulatory Visit: Payer: Medicare Other

## 2023-11-22 ENCOUNTER — Other Ambulatory Visit: Payer: Medicare Other

## 2023-11-22 ENCOUNTER — Ambulatory Visit: Payer: Medicare Other | Admitting: Oncology

## 2023-12-21 ENCOUNTER — Encounter: Payer: Self-pay | Admitting: Oncology

## 2023-12-21 NOTE — Telephone Encounter (Signed)
 I m ok with virtual visit and plan above

## 2023-12-23 ENCOUNTER — Other Ambulatory Visit: Payer: Self-pay

## 2023-12-23 DIAGNOSIS — D61818 Other pancytopenia: Secondary | ICD-10-CM

## 2023-12-26 ENCOUNTER — Other Ambulatory Visit: Payer: Medicare Other

## 2023-12-26 ENCOUNTER — Ambulatory Visit: Payer: Medicare Other | Admitting: Oncology

## 2023-12-28 ENCOUNTER — Encounter: Payer: Self-pay | Admitting: Oncology

## 2024-01-05 ENCOUNTER — Encounter: Payer: Self-pay | Admitting: Oncology

## 2024-01-05 ENCOUNTER — Other Ambulatory Visit: Payer: Self-pay

## 2024-01-05 ENCOUNTER — Inpatient Hospital Stay: Attending: Oncology | Admitting: Oncology

## 2024-01-05 DIAGNOSIS — I10 Essential (primary) hypertension: Secondary | ICD-10-CM | POA: Insufficient documentation

## 2024-01-05 DIAGNOSIS — G4730 Sleep apnea, unspecified: Secondary | ICD-10-CM | POA: Insufficient documentation

## 2024-01-05 DIAGNOSIS — D469 Myelodysplastic syndrome, unspecified: Secondary | ICD-10-CM | POA: Insufficient documentation

## 2024-01-05 DIAGNOSIS — Z7984 Long term (current) use of oral hypoglycemic drugs: Secondary | ICD-10-CM | POA: Insufficient documentation

## 2024-01-05 DIAGNOSIS — Z9841 Cataract extraction status, right eye: Secondary | ICD-10-CM | POA: Insufficient documentation

## 2024-01-05 DIAGNOSIS — Z860100 Personal history of colon polyps, unspecified: Secondary | ICD-10-CM | POA: Insufficient documentation

## 2024-01-05 DIAGNOSIS — E119 Type 2 diabetes mellitus without complications: Secondary | ICD-10-CM | POA: Insufficient documentation

## 2024-01-05 DIAGNOSIS — D61818 Other pancytopenia: Secondary | ICD-10-CM | POA: Diagnosis not present

## 2024-01-05 DIAGNOSIS — Z8505 Personal history of malignant neoplasm of liver: Secondary | ICD-10-CM | POA: Insufficient documentation

## 2024-01-05 DIAGNOSIS — Z79899 Other long term (current) drug therapy: Secondary | ICD-10-CM | POA: Insufficient documentation

## 2024-01-05 DIAGNOSIS — K7469 Other cirrhosis of liver: Secondary | ICD-10-CM | POA: Diagnosis not present

## 2024-01-05 DIAGNOSIS — Z85828 Personal history of other malignant neoplasm of skin: Secondary | ICD-10-CM | POA: Insufficient documentation

## 2024-01-05 DIAGNOSIS — D539 Nutritional anemia, unspecified: Secondary | ICD-10-CM | POA: Diagnosis not present

## 2024-01-05 DIAGNOSIS — Z9842 Cataract extraction status, left eye: Secondary | ICD-10-CM | POA: Insufficient documentation

## 2024-01-05 DIAGNOSIS — K209 Esophagitis, unspecified without bleeding: Secondary | ICD-10-CM | POA: Insufficient documentation

## 2024-01-05 NOTE — Progress Notes (Signed)
 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  Added Omeprazole  and Linzess to medication list. Goes to bathroom 3-4x daily d/t hepatic encepalopathy

## 2024-01-09 NOTE — Progress Notes (Signed)
 I connected with Jack Powell on 01/09/24 at 10:30 AM EDT by video enabled telemedicine visit and verified that I am speaking with the correct person using two identifiers.   I discussed the limitations, risks, security and privacy concerns of performing an evaluation and management service by telemedicine and the availability of in-person appointments. I also discussed with the patient that there may be a patient responsible charge related to this service. The patient expressed understanding and agreed to proceed.  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 He has been referred for anemia.  CBC on 04/26/2023 showed white count of 2.8, H&H of 9.7/28.9 with an MCV of 107.4 and a platelet count of 67.  Iron  studies were normal with an iron  saturation of 27% with a ferritin level of 84.  CMP showed elevated bilirubin of 2 with a normal ALK phosphatase AST ALT.  aFP normal at 5.1.  Looking back at his prior CBCs patient has had Chronic thrombocytopenia with a platelet count has remained stable between 60s to 70s over the last 1 year.  White cell count has been fluctuating between 2.5-4 predominantly with lymphopenia.  His hemoglobin was 12 back in November 2022 and dropped to 10.8 in March 2023.  He has had persistent macrocytosis.  Patient has been on syprine  medication for many years as a chelating agent.  He was started on Xifaxan  for encephalopathy in the last couple of years   Appetite has been generally good but he has still been losing weight.  He has been trying to keep up with his protein intake in the form of protein shakes.  He has been  getting surveillance scans for hepatocellular carcinoma surveillance and last scan was in September 2024 which showed cirrhotic liver morphology without focal hepatic lesion and evidence of splenomegaly and portal hypertension.  He is a lifelong non-smoker   Blood work from 04/27/2023 were as follows: B12 folate TSH normal.  Myeloma panel showed no M protein and serum free light chain ratio was normal.  LDH mildly elevated at 282.  Haptoglobin less than 10.  Reticulocyte count normal at 3.1.   Bone marrow biopsy on 06/10/2019 if showed hypercellular bone marrow with trilineage hematopoiesis and relative abundance of erythroid precursors.  Dyspoietic changes primary involving megakaryocytic and erythroid cell lines with no increase in blastic cells.  Decreased iron  stores.  Overall changes are limited and may be secondary in nature in the setting of liver disease.  Early involvement of MDS not excluded.  Cytogenetics were normal.  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  He is being followed by home palliative care.  Overall he is deconditioned and unable to make an in person trip to the cancer center.  He does not feel quite ready for hospice yet.  He is trying to keep up with his Xifaxan  dosing so that he can have regular bowel movements and not land up with encephalopathy again.   Review of Systems  Constitutional:  Positive for malaise/fatigue. Negative for chills, fever  and weight loss.  HENT:  Negative for congestion, ear discharge and nosebleeds.   Eyes:  Negative for blurred vision.  Respiratory:  Negative for cough, hemoptysis, sputum production, shortness of breath and wheezing.   Cardiovascular:  Negative for chest pain, palpitations, orthopnea and claudication.  Gastrointestinal:  Negative for  abdominal pain, blood in stool, constipation, diarrhea, heartburn, melena, nausea and vomiting.  Genitourinary:  Negative for dysuria, flank pain, frequency, hematuria and urgency.  Musculoskeletal:  Negative for back pain, joint pain and myalgias.  Skin:  Negative for rash.  Neurological:  Negative for dizziness, tingling, focal weakness, seizures, weakness and headaches.  Endo/Heme/Allergies:  Does not bruise/bleed easily.  Psychiatric/Behavioral:  Negative for depression and suicidal ideas. The patient does not have insomnia.     Allergies  Allergen Reactions   Ace Inhibitors Cough   Serotonin Other (See Comments)    hallucinations   Shellfish Allergy Other (See Comments)    Nuts  Chocolate   Penicillins Rash    Past Medical History:  Diagnosis Date   Anemia    Cirrhosis (HCC)    Diabetes mellitus without complication (HCC)    Esophagitis    GERD (gastroesophageal reflux disease)    History of colon polyps    Hypertension    PONV (postoperative nausea and vomiting)    Skin cancer    Sleep apnea    Wilson disease     Past Surgical History:  Procedure Laterality Date   CATARACT EXTRACTION W/PHACO Right 02/27/2020   Procedure: CATARACT EXTRACTION PHACO AND INTRAOCULAR LENS PLACEMENT (IOC) RIGHT DIABETIC 3.78   00:55.4 6.8%;  Surgeon: Mittie Gaskin, MD;  Location: [facility];  Service: Ophthalmology;  Laterality: Right;  Diabetic - oral meds   CATARACT EXTRACTION W/PHACO Left 03/12/2020   Procedure: CATARACT EXTRACTION PHACO AND INTRAOCULAR LENS PLACEMENT (IOC) LEFT DIABETIC 1.54 00:29.3 5.2%;  Surgeon: Mittie Gaskin, MD;  Location: [facility];  Service: Ophthalmology;  Laterality: Left;  patient prefers arrival after 10am   COLONOSCOPY     COLONOSCOPY WITH PROPOFOL  N/A 08/18/2023   Procedure: COLONOSCOPY WITH PROPOFOL ;  Surgeon: Unk Corinn Skiff, MD;  Location: [facility] ENDOSCOPY;  Service: Gastroenterology;  Laterality: N/A;   ESOPHAGEAL  BANDING  01/13/2023   Procedure: ESOPHAGEAL BANDING;  Surgeon: Unk Corinn Skiff, MD;  Location: [facility] ENDOSCOPY;  Service: Gastroenterology;;   ESOPHAGEAL BANDING  02/24/2023   Procedure: ESOPHAGEAL BANDING;  Surgeon: Unk Corinn Skiff, MD;  Location: [facility] ENDOSCOPY;  Service: Gastroenterology;;   ESOPHAGEAL BRUSHING  04/21/2023   Procedure: ESOPHAGEAL BRUSHING;  Surgeon: Unk Corinn Skiff, MD;  Location: [facility] ENDOSCOPY;  Service: Gastroenterology;;   ESOPHAGOGASTRODUODENOSCOPY N/A 01/13/2023   Procedure: ESOPHAGOGASTRODUODENOSCOPY (EGD);  Surgeon: Unk Corinn Skiff, MD;  Location: [facility] ENDOSCOPY;  Service: Gastroenterology;  Laterality: N/A;   ESOPHAGOGASTRODUODENOSCOPY (EGD) WITH PROPOFOL  N/A 05/05/2016   Procedure: ESOPHAGOGASTRODUODENOSCOPY (EGD) WITH PROPOFOL ;  Surgeon: Lamar ONEIDA Holmes, MD;  Location: [facility] ENDOSCOPY;  Service: Endoscopy;  Laterality: N/A;   ESOPHAGOGASTRODUODENOSCOPY (EGD) WITH PROPOFOL  N/A 02/24/2023   Procedure: ESOPHAGOGASTRODUODENOSCOPY (EGD) WITH PROPOFOL ;  Surgeon: Unk Corinn Skiff, MD;  Location: [facility] ENDOSCOPY;  Service: Gastroenterology;  Laterality: N/A;   ESOPHAGOGASTRODUODENOSCOPY (EGD) WITH PROPOFOL  N/A 04/21/2023   Procedure: ESOPHAGOGASTRODUODENOSCOPY (EGD) WITH PROPOFOL ;  Surgeon: Unk Corinn Skiff, MD;  Location: [facility] ENDOSCOPY;  Service: Gastroenterology;  Laterality: N/A;   ESOPHAGOGASTRODUODENOSCOPY (EGD) WITH PROPOFOL  N/A 08/18/2023   Procedure: ESOPHAGOGASTRODUODENOSCOPY (EGD) WITH PROPOFOL ;  Surgeon: Unk Corinn Skiff, MD;  Location: [facility] ENDOSCOPY;  Service: Gastroenterology;  Laterality: N/A;  EYE SURGERY     HOT HEMOSTASIS  01/13/2023   Procedure: HOT HEMOSTASIS (ARGON PLASMA COAGULATION/BICAP);  Surgeon: Unk Corinn Skiff, MD;  Location: [facility];  Service: Gastroenterology;;   HOT HEMOSTASIS  02/24/2023   Procedure: HOT HEMOSTASIS (ARGON PLASMA COAGULATION/BICAP);  Surgeon: Unk Corinn Skiff, MD;  Location: [facility]  ENDOSCOPY;  Service: Gastroenterology;;   IR BONE MARROW BIOPSY & ASPIRATION  06/10/2023   IR PARACENTESIS  08/25/2023   LIVER BIOPSY     LUMBAR LAMINECTOMY/DECOMPRESSION MICRODISCECTOMY Left 09/07/2023   Procedure: LEFT LUMBAR 4 - LUMBAR 5 MICRODISCECTOMY;  Surgeon: Onetha Kuba, MD;  Location: [facility] OR;  Service: Neurosurgery;  Laterality: Left;  Microdiscectomy - L4-L5 - left   SKIN CANCER EXCISION     TONSILLECTOMY     UVULOPALATOPHARYNGOPLASTY  2005    Social History   Socioeconomic History   Marital status: Married    Spouse name: Not on file   Number of children: Not on file   Years of education: Not on file   Highest education level: Not on file  Occupational History   Not on file  Tobacco Use   Smoking status: Never   Smokeless tobacco: Never  Vaping Use   Vaping status: Never Used  Substance and Sexual Activity   Alcohol  use: No   Drug use: No   Sexual activity: Not Currently  Other Topics Concern   Not on file  Social History Narrative   Live with [place], wife.  One pet, dog, Willow.   Social Drivers of Corporate investment banker Strain: Low Risk  (11/25/2023)   Received from [facility]   Overall Financial Resource Strain (CARDIA)    Difficulty of Paying Living Expenses: Not hard at all  Food Insecurity: No Food Insecurity (01/07/2024)   Received from [facility]   Hunger Vital Sign    Within the past 12 months, you worried that your food would run out before you got the money to buy more.: Never true    Ran Out of Food in the Last Year: Never true  Transportation Needs: No Transportation Needs (01/07/2024)   Received from [facility] - Transportation    In the past 12 months, has lack of transportation kept you from medical appointments or from getting medications?: No    In the past 12 months, has lack of transportation kept you from meetings, work, or from getting things needed for daily living?: No  Physical Activity:  Insufficiently Active (11/23/2022)   Received from [facility]   Exercise Vital Sign    On average, how many days per week do you engage in moderate to strenuous exercise (like a brisk walk)?: 2 days    On average, how many minutes do you engage in exercise at this level?: 20 min  Stress: No Stress Concern Present (01/07/2024)   Received from [facility] - Occupational Stress Questionnaire    Do you feel stress - tense, restless, nervous, or anxious, or unable to sleep at night because your mind is troubled all the time - these days?: Not at all  Social Connections: Moderately Integrated (08/26/2023)   Social Connection and Isolation Panel    Frequency of Communication with Friends and Family: Once a week    Frequency of Social Gatherings with Friends and Family: Once a week    Attends Religious Services: 1 to 4 times per year    Active Member of [employer] or Organizations:  No    Attends Club or Organization Meetings: 1 to 4 times per year    Marital Status: Married  Catering manager Violence: Not At Risk (01/07/2024)   Received from [facility]   HITS    Over the last 12 months how often did your partner physically hurt you?: Never    Over the last 12 months how often did your partner insult you or talk down to you?: Never    Over the last 12 months how often did your partner threaten you with physical harm?: Never    Over the last 12 months how often did your partner scream or curse at you?: Never    Family History  Problem Relation Age of Onset   Diabetes Mother    Heart disease Mother    Cancer Father        colon   Heart attack Father      Current Outpatient Medications:    cholecalciferol  (VITAMIN D ) 1000 units tablet, Take 1,000 Units by mouth daily., Disp: , Rfl:    empagliflozin  (JARDIANCE ) 10 MG TABS tablet, Take 5 mg by mouth 2 (two) times daily., Disp: , Rfl:    glipiZIDE  (GLUCOTROL  XL) 2.5 MG 24 hr tablet, Take 10 mg by  mouth daily with breakfast. Increased to 10mg  from 2.5 previously listed, Disp: , Rfl:    Glycerin -Hypromellose-PEG 400 (DRY EYE RELIEF DROPS) 0.2-0.2-1 % SOLN, Place 1 drop into both eyes daily as needed (Dry eyes)., Disp: , Rfl:    lactulose , encephalopathy, (CHRONULAC ) 10 GM/15ML SOLN, Take 45 mLs (30 g total) by mouth 3 (three) times daily. Goal to have 2-3 BM daily, Disp: , Rfl:    linaclotide (LINZESS) 72 MCG capsule, Take 72 mcg by mouth daily before breakfast., Disp: , Rfl:    Magnesium  250 MG TABS, Take 250 mg by mouth daily., Disp: , Rfl:    MULTIPLE VITAMIN PO, Take 1 tablet by mouth daily. , Disp: , Rfl:    omeprazole  (PRILOSEC OTC) 20 MG tablet, Take 20 mg by mouth daily., Disp: , Rfl:    ondansetron  (ZOFRAN -ODT) 4 MG disintegrating tablet, Take 4 mg by mouth every 8 (eight) hours as needed for nausea., Disp: , Rfl:    rifaximin  (XIFAXAN ) 550 MG TABS tablet, Take 550 mg by mouth 2 (two) times daily., Disp: , Rfl:    spironolactone  (ALDACTONE ) 50 MG tablet, Take 50 mg by mouth daily., Disp: , Rfl:    tamsulosin  (FLOMAX ) 0.4 MG CAPS capsule, Take 0.4 mg by mouth daily., Disp: , Rfl:    Trientine  HCl (SYPRINE ) 250 MG CAPS, Take 2 capsules (500 mg total) by mouth 2 (two) times daily., Disp: , Rfl:    carvedilol  (COREG ) 6.25 MG tablet, Take 3.125 mg by mouth 2 (two) times daily with a meal. (Patient not taking: Reported on 01/05/2024), Disp: , Rfl:    cyclobenzaprine  (FLEXERIL ) 10 MG tablet, Take 1 tablet (10 mg total) by mouth 3 (three) times daily as needed for muscle spasms., Disp: 30 tablet, Rfl: 0   donepezil  (ARICEPT ) 10 MG tablet, Take 5 mg by mouth at bedtime. (Patient not taking: Reported on 01/05/2024), Disp: , Rfl:    furosemide  (LASIX ) 20 MG tablet, Take 40 mg by mouth 2 (two) times daily. (Patient not taking: Reported on 01/05/2024), Disp: , Rfl:    gabapentin  (NEURONTIN ) 100 MG capsule, Take 100 mg by mouth 2 (two) times daily., Disp: , Rfl:    HYDROcodone -acetaminophen   (NORCO/VICODIN) 5-325 MG tablet, Take 1-2 tablets  by mouth every 6 (six) hours as needed for severe pain (pain score 7-10) or moderate pain (pain score 4-6) (1 tablet for moderate pain, t tablets for severe pain)., Disp: 30 tablet, Rfl: 0   ketoconazole  (NIZORAL ) 2 % shampoo, Apply 1 Application topically 2 (two) times a week., Disp: , Rfl:    lipase/protease/amylase (CREON ) 36000 UNITS CPEP capsule, Take 2 capsules with the first bite of each meal and 1 capsule with the first bite of each snack (Patient not taking: Reported on 01/05/2024), Disp: 240 capsule, Rfl: 3   OLANZapine  (ZYPREXA ) 2.5 MG tablet, Take 1 tablet (2.5 mg total) by mouth at bedtime. (Patient not taking: Reported on 01/05/2024), Disp: 30 tablet, Rfl: 0   pantoprazole  (PROTONIX ) 40 MG tablet, Take 40 mg by mouth daily. (Patient not taking: Reported on 01/05/2024), Disp: , Rfl:   No results found.  No images are attached to the encounter.      Latest Ref Rng & Units 08/26/2023    8:22 AM  CMP  Glucose 70 - 99 mg/dL 734   BUN 8 - 23 mg/dL 17   Creatinine 9.38 - 1.24 mg/dL 9.36   Sodium 864 - 854 mmol/L 137   Potassium 3.5 - 5.1 mmol/L 3.7   Chloride 98 - 111 mmol/L 104   CO2 22 - 32 mmol/L 24   Calcium 8.9 - 10.3 mg/dL 8.2   Total Protein 6.5 - 8.1 g/dL 5.0   Total Bilirubin 0.0 - 1.2 mg/dL 1.9   Alkaline Phos 38 - 126 U/L 74   AST 15 - 41 U/L 40   ALT 0 - 44 U/L 46       Latest Ref Rng & Units 09/07/2023    8:59 AM  CBC  WBC 4.0 - 10.5 K/uL 3.7   Hemoglobin 13.0 - 17.0 g/dL 89.3   Hematocrit 60.9 - 52.0 % 31.7   Platelets 150 - 400 K/uL 72      Observation/objective: Appears in no acute distress over video visit today.  Breathing is nonlabored  Assessment and plan: Patient is a 73 year old male and this is a routine follow-up visit for pancytopenia  Pancytopenia likely multifactorial secondary to underlying cirrhosis.  Although bone marrow biopsy did not show any overt evidence of MDS.  There was some  dyspoiesis noted in megakaryocytic and erythroid cell lines.  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  When I had seen him back in February 2025 his hemoglobin was between 9-10 and therefore I had not initiatedEPO at that time.  Patient's hemoglobin presently is around 9 as well.  Mild leukopenia/neutropenia.  Platelet counts have remained stable in the 60s.  Patient lives in [place] and is finding it difficult to come to [place] [facility] center.  [facility] near [place] would be closer to him.  I am therefore making a referral for him to be seen there and follow-up with his pancytopenia there.  Certainly EPO is a consideration in the future should his hemoglobin drop down to less than 9.  I am tentatively giving him an appointment with labs to be done in 3 months but if he is already established with [facility] cancer center at that time he does not require follow-up with me  Follow-up instructions:as above  I discussed the assessment and treatment plan with the patient. The patient was provided an opportunity to ask questions and all were answered. The patient agreed with the plan and demonstrated an understanding of the instructions.  The patient was advised to call back or seek an in-person evaluation if the symptoms worsen or if the condition fails to improve as anticipated.  I provided 14 minutes of face-to-face video visit time during this encounter, and > 50% was spent counseling as documented under my assessment & plan.  Visit Diagnosis: 1. Other pancytopenia (HCC)   2. Other cirrhosis of liver (HCC)   3. Myelodysplastic syndrome (HCC)   4. Macrocytic anemia     Dr. Annah Skene, MD, MPH [facility] Tel- [phone] 01/09/2024 5:38 PM

## 2024-02-08 DEATH — deceased

## 2024-02-09 ENCOUNTER — Telehealth: Payer: Self-pay | Admitting: *Deleted

## 2024-02-09 NOTE — Telephone Encounter (Signed)
 Kia from [facility] needed pt's dx codes from 01/07/2024 [facility] requisition to file pt's insurance. - pancytopenia icd-10 codes provided.

## 2024-04-19 ENCOUNTER — Telehealth: Admitting: Oncology
